# Patient Record
Sex: Female | Born: 1940
Health system: Southern US, Community
[De-identification: ages and names within clinical notes are randomized; demographics above are authoritative.]

## PROBLEM LIST (undated history)

## (undated) DIAGNOSIS — I509 Heart failure, unspecified: Secondary | ICD-10-CM

## (undated) DIAGNOSIS — Z950 Presence of cardiac pacemaker: Secondary | ICD-10-CM

## (undated) DIAGNOSIS — L0390 Cellulitis, unspecified: Secondary | ICD-10-CM

## (undated) DIAGNOSIS — I7121 Aneurysm of the ascending aorta, without rupture: Secondary | ICD-10-CM

## (undated) DIAGNOSIS — C50919 Malignant neoplasm of unspecified site of unspecified female breast: Secondary | ICD-10-CM

## (undated) DIAGNOSIS — M1990 Unspecified osteoarthritis, unspecified site: Secondary | ICD-10-CM

## (undated) DIAGNOSIS — I272 Pulmonary hypertension, unspecified: Secondary | ICD-10-CM

## (undated) DIAGNOSIS — I10 Essential (primary) hypertension: Secondary | ICD-10-CM

## (undated) DIAGNOSIS — C50911 Malignant neoplasm of unspecified site of right female breast: Secondary | ICD-10-CM

## (undated) DIAGNOSIS — I8390 Asymptomatic varicose veins of unspecified lower extremity: Secondary | ICD-10-CM

## (undated) DIAGNOSIS — E785 Hyperlipidemia, unspecified: Secondary | ICD-10-CM

## (undated) DIAGNOSIS — C50411 Malignant neoplasm of upper-outer quadrant of right female breast: Secondary | ICD-10-CM

## (undated) DIAGNOSIS — I480 Paroxysmal atrial fibrillation: Secondary | ICD-10-CM

## (undated) HISTORY — PX: JOINT REPLACEMENT: SHX530

## (undated) HISTORY — DX: Heart failure, unspecified: I50.9

## (undated) HISTORY — PX: SHOULDER SURGERY: SHX246

## (undated) HISTORY — PX: FOOT SURGERY: SHX648

## (undated) HISTORY — DX: Malignant neoplasm of unspecified site of unspecified female breast: C50.919

## (undated) HISTORY — DX: Aneurysm of the ascending aorta, without rupture: I71.21

## (undated) HISTORY — DX: Malignant neoplasm of unspecified site of right female breast: C50.911

## (undated) HISTORY — PX: CHOLECYSTECTOMY: SHX55

## (undated) HISTORY — PX: ABDOMINAL HYSTERECTOMY: SHX81

## (undated) HISTORY — DX: Cellulitis, unspecified: L03.90

## (undated) HISTORY — DX: Pulmonary hypertension, unspecified: I27.20

## (undated) HISTORY — DX: Malignant neoplasm of upper-outer quadrant of right female breast: C50.411

## (undated) HISTORY — DX: Unspecified osteoarthritis, unspecified site: M19.90

## (undated) HISTORY — DX: Hyperlipidemia, unspecified: E78.5

## (undated) HISTORY — DX: Paroxysmal atrial fibrillation: I48.0

## (undated) HISTORY — DX: Asymptomatic varicose veins of unspecified lower extremity: I83.90

## (undated) HISTORY — DX: Essential (primary) hypertension: I10

---

## 2007-10-29 ENCOUNTER — Ambulatory Visit: Payer: Self-pay | Admitting: Cardiology

## 2010-02-07 ENCOUNTER — Ambulatory Visit: Payer: Self-pay | Admitting: Cardiology

## 2011-04-11 ENCOUNTER — Other Ambulatory Visit: Payer: Self-pay

## 2011-04-11 DIAGNOSIS — M7989 Other specified soft tissue disorders: Secondary | ICD-10-CM

## 2011-05-16 DIAGNOSIS — J019 Acute sinusitis, unspecified: Secondary | ICD-10-CM | POA: Diagnosis not present

## 2011-05-16 DIAGNOSIS — I10 Essential (primary) hypertension: Secondary | ICD-10-CM | POA: Diagnosis not present

## 2011-05-16 DIAGNOSIS — F320 Major depressive disorder, single episode, mild: Secondary | ICD-10-CM | POA: Diagnosis not present

## 2011-05-16 DIAGNOSIS — J029 Acute pharyngitis, unspecified: Secondary | ICD-10-CM | POA: Diagnosis not present

## 2011-05-16 DIAGNOSIS — N3000 Acute cystitis without hematuria: Secondary | ICD-10-CM | POA: Diagnosis not present

## 2011-05-16 DIAGNOSIS — M549 Dorsalgia, unspecified: Secondary | ICD-10-CM | POA: Diagnosis not present

## 2011-05-16 DIAGNOSIS — J209 Acute bronchitis, unspecified: Secondary | ICD-10-CM | POA: Diagnosis not present

## 2011-06-13 DIAGNOSIS — J029 Acute pharyngitis, unspecified: Secondary | ICD-10-CM | POA: Diagnosis not present

## 2011-06-13 DIAGNOSIS — F320 Major depressive disorder, single episode, mild: Secondary | ICD-10-CM | POA: Diagnosis not present

## 2011-06-13 DIAGNOSIS — M549 Dorsalgia, unspecified: Secondary | ICD-10-CM | POA: Diagnosis not present

## 2011-06-13 DIAGNOSIS — J209 Acute bronchitis, unspecified: Secondary | ICD-10-CM | POA: Diagnosis not present

## 2011-06-13 DIAGNOSIS — J019 Acute sinusitis, unspecified: Secondary | ICD-10-CM | POA: Diagnosis not present

## 2011-06-13 DIAGNOSIS — I10 Essential (primary) hypertension: Secondary | ICD-10-CM | POA: Diagnosis not present

## 2011-06-13 DIAGNOSIS — N3000 Acute cystitis without hematuria: Secondary | ICD-10-CM | POA: Diagnosis not present

## 2011-06-24 ENCOUNTER — Other Ambulatory Visit: Payer: Self-pay

## 2011-06-24 ENCOUNTER — Encounter: Payer: Self-pay | Admitting: Vascular Surgery

## 2011-07-10 DIAGNOSIS — J029 Acute pharyngitis, unspecified: Secondary | ICD-10-CM | POA: Diagnosis not present

## 2011-07-10 DIAGNOSIS — J019 Acute sinusitis, unspecified: Secondary | ICD-10-CM | POA: Diagnosis not present

## 2011-07-10 DIAGNOSIS — M47817 Spondylosis without myelopathy or radiculopathy, lumbosacral region: Secondary | ICD-10-CM | POA: Diagnosis not present

## 2011-07-10 DIAGNOSIS — J209 Acute bronchitis, unspecified: Secondary | ICD-10-CM | POA: Diagnosis not present

## 2011-07-10 DIAGNOSIS — M519 Unspecified thoracic, thoracolumbar and lumbosacral intervertebral disc disorder: Secondary | ICD-10-CM | POA: Diagnosis not present

## 2011-07-10 DIAGNOSIS — N3000 Acute cystitis without hematuria: Secondary | ICD-10-CM | POA: Diagnosis not present

## 2011-07-10 DIAGNOSIS — I10 Essential (primary) hypertension: Secondary | ICD-10-CM | POA: Diagnosis not present

## 2011-07-10 DIAGNOSIS — M5137 Other intervertebral disc degeneration, lumbosacral region: Secondary | ICD-10-CM | POA: Diagnosis not present

## 2011-07-10 DIAGNOSIS — F320 Major depressive disorder, single episode, mild: Secondary | ICD-10-CM | POA: Diagnosis not present

## 2011-07-10 DIAGNOSIS — M549 Dorsalgia, unspecified: Secondary | ICD-10-CM | POA: Diagnosis not present

## 2011-07-15 DIAGNOSIS — I10 Essential (primary) hypertension: Secondary | ICD-10-CM | POA: Diagnosis not present

## 2011-07-15 DIAGNOSIS — R109 Unspecified abdominal pain: Secondary | ICD-10-CM | POA: Diagnosis not present

## 2011-07-15 DIAGNOSIS — F329 Major depressive disorder, single episode, unspecified: Secondary | ICD-10-CM | POA: Diagnosis not present

## 2011-07-15 DIAGNOSIS — Z780 Asymptomatic menopausal state: Secondary | ICD-10-CM | POA: Diagnosis not present

## 2011-07-15 DIAGNOSIS — R599 Enlarged lymph nodes, unspecified: Secondary | ICD-10-CM | POA: Diagnosis not present

## 2011-07-15 DIAGNOSIS — Z7982 Long term (current) use of aspirin: Secondary | ICD-10-CM | POA: Diagnosis not present

## 2011-07-15 DIAGNOSIS — Z79899 Other long term (current) drug therapy: Secondary | ICD-10-CM | POA: Diagnosis not present

## 2011-07-15 DIAGNOSIS — K449 Diaphragmatic hernia without obstruction or gangrene: Secondary | ICD-10-CM | POA: Diagnosis not present

## 2011-07-16 ENCOUNTER — Encounter: Payer: Self-pay | Admitting: Vascular Surgery

## 2011-07-16 DIAGNOSIS — R109 Unspecified abdominal pain: Secondary | ICD-10-CM | POA: Diagnosis not present

## 2011-07-16 DIAGNOSIS — F329 Major depressive disorder, single episode, unspecified: Secondary | ICD-10-CM | POA: Diagnosis not present

## 2011-07-16 DIAGNOSIS — K449 Diaphragmatic hernia without obstruction or gangrene: Secondary | ICD-10-CM | POA: Diagnosis not present

## 2011-07-16 DIAGNOSIS — I10 Essential (primary) hypertension: Secondary | ICD-10-CM | POA: Diagnosis not present

## 2011-07-21 ENCOUNTER — Encounter: Payer: Self-pay | Admitting: Vascular Surgery

## 2011-07-22 ENCOUNTER — Encounter: Payer: Self-pay | Admitting: Vascular Surgery

## 2011-07-25 ENCOUNTER — Encounter: Payer: Self-pay | Admitting: Vascular Surgery

## 2011-07-28 ENCOUNTER — Encounter: Payer: Self-pay | Admitting: Vascular Surgery

## 2011-07-28 ENCOUNTER — Ambulatory Visit (INDEPENDENT_AMBULATORY_CARE_PROVIDER_SITE_OTHER): Payer: Medicare Other | Admitting: Vascular Surgery

## 2011-07-28 VITALS — BP 150/85 | HR 76 | Resp 20 | Ht 64.0 in | Wt 178.0 lb

## 2011-07-28 DIAGNOSIS — M7989 Other specified soft tissue disorders: Secondary | ICD-10-CM

## 2011-07-28 DIAGNOSIS — I83893 Varicose veins of bilateral lower extremities with other complications: Secondary | ICD-10-CM

## 2011-07-28 NOTE — Progress Notes (Signed)
LLE venous reflux duplex performed @ [facility] 07/28/2011

## 2011-07-28 NOTE — Progress Notes (Signed)
Subjective:     Patient ID: Alicia Nash, female   DOB: [birth year], 71 y.o.   MRN: [id number]  HPI this 71 year old female was referred for swelling in the left lower extremity to rule out venous insufficiency. She has no history of DVT. She does have a remote history of thrombophlebitis in the right leg. She has a remote history of vein stripping in the right leg 10-15 years ago. She denies any history of stasis ulcers, bulging varicosities in the left leg, bleeding. She has tried wearing elastic compression stockings with the first stockings caused a rash on her legs. She had these replaced with a lighter weight bear which she is currently wearing. She does elevate her legs at night on a pillow. She has aching and throbbing discomfort in the leg as the day progresses. She has a history of bilateral knee replacements by Dr. Joni Fears  Past Medical History  Diagnosis Date  . Cellulitis     Right leg  . Hyperlipidemia   . Varicose veins     venous stasis skin changes    History  Substance Use Topics  . Smoking status: Never Smoker   . Smokeless tobacco: Never Used  . Alcohol Use: No    Family History  Problem Relation Age of Onset  . Hypertension Other   . Diabetes Mother     Allergies  Allergen Reactions  . Other     Blisters after wear compression stockings    Current outpatient prescriptions:AMLODIPINE BESYLATE PO, Take by mouth., Disp: , Rfl: ;  aspirin 81 MG tablet, Take 81 mg by mouth daily., Disp: , Rfl: ;  BENAZEPRIL-HYDROCHLOROTHIAZIDE PO, Take by mouth., Disp: , Rfl: ;  CITALOPRAM HYDROBROMIDE PO, Take by mouth., Disp: , Rfl: ;  DIAZEPAM PO, Take by mouth., Disp: , Rfl: ;  DICLOFENAC POTASSIUM PO, Take by mouth., Disp: , Rfl: ;  Docusate Calcium (STOOL SOFTENER PO), Take by mouth., Disp: , Rfl:  Fenofibrate (TRICOR PO), Take by mouth., Disp: , Rfl: ;  Multiple Vitamin (MULTIVITAMIN) tablet, Take 1 tablet by mouth daily., Disp: , Rfl: ;  omeprazole (PRILOSEC) 40 MG  capsule, Take 40 mg by mouth daily., Disp: , Rfl:   BP 150/85  Pulse 76  Resp 20  Ht _0  (1.626 m)  Wt 178 lb (80.74 kg)  BMI 30.55 kg/m2  Body mass index is 30.55 kg/(m^2).         Review of Systems has multiple joint problems including bilateral knee replacements and orthopedic procedures on her feet. Denies chest pain, dyspnea on exertion, PND, orthopnea, neurologic symptoms suggesting TIAs, other systems are negative and complete review of systems    Objective:   Physical Exam blood pressure 150/85 heart rate 76 respirations 20 Gen.-alert and oriented x3 in no apparent distress HEENT normal for age Lungs no rhonchi or wheezing Cardiovascular regular rhythm no murmurs carotid pulses 3+ palpable no bruits audible Abdomen soft nontender no palpable masses Musculoskeletal free of  major deformities Skin clear -no rashes Neurologic normal Lower extremities 3+ femoral and dorsalis pedis pulses palpable bilaterally with 1+ edema in the left leg. She has some mild erythema in the left pretibial region. There are no active ulcerations or varicosities noted. Right leg has some hyperpigmentation and thickening of the skin in the lower third. There are reticular and spider veins in the ankle and foot on the right. There is less edema on the right than the left. No active ulcerations are noted.  Today I  ordered bilateral venous duplex exam which I reviewed and the left great saphenous vein has no reflux in the left small saphenous vein has no reflux. The left deep system does have some reflux at the popliteal level which is mild. There is no DVT.      Assessment:     Edema left leg with deep venous reflux in the popliteal vein No active ulcerations    Plan:     #1 elevate foot of the bed 2 inches at night to help decrease swelling #2 continue wearing daily bilateral elastic compression stocking #3 can discuss possible diuretic therapy with her medical physician   return to see  [place] on when necessary basis

## 2011-08-05 NOTE — Procedures (Unsigned)
LOWER EXTREMITY VENOUS REFLUX EXAM  INDICATION:  Left lower extremity varicose veins, edema  EXAM:  Using color-flow imaging and pulse Doppler spectral analysis, the left common femoral, femoral, popliteal, posterior tibial, great and small saphenous veins were evaluated.  There is evidence suggesting deep venous insufficiency in the left lower extremity.  The left saphenofemoral junction is competent.  The left GSV is competent.  The left proximal small saphenous vein demonstrates competency.  GSV Diameter (used if found to be incompetent only)                                           Right    Left Proximal Greater Saphenous Vein           cm       cm Proximal-to-mid-thigh                     cm       cm Mid thigh                                 cm       cm Mid-distal thigh                          cm       cm Distal thigh                              cm       cm Knee                                      cm       cm  IMPRESSION: 1. The left great saphenous vein is competent. 2. The left great saphenous vein is not tortuous. 3. The deep venous system of the left lower extremity is not competent     with reflux of >500 milliseconds. 4. The left small saphenous vein is competent.  ___________________________________________ Nelda Severe. Kellie Simmering, M.D.  SH/MEDQ  D:  07/28/2011  T:  07/28/2011  Job:  [id number]

## 2011-08-21 DIAGNOSIS — E782 Mixed hyperlipidemia: Secondary | ICD-10-CM | POA: Diagnosis not present

## 2011-08-21 DIAGNOSIS — L02419 Cutaneous abscess of limb, unspecified: Secondary | ICD-10-CM | POA: Diagnosis not present

## 2011-11-27 DIAGNOSIS — J209 Acute bronchitis, unspecified: Secondary | ICD-10-CM | POA: Diagnosis not present

## 2011-12-01 DIAGNOSIS — M79609 Pain in unspecified limb: Secondary | ICD-10-CM | POA: Diagnosis not present

## 2011-12-01 DIAGNOSIS — M19079 Primary osteoarthritis, unspecified ankle and foot: Secondary | ICD-10-CM | POA: Diagnosis not present

## 2011-12-04 DIAGNOSIS — G8929 Other chronic pain: Secondary | ICD-10-CM | POA: Diagnosis present

## 2011-12-04 DIAGNOSIS — L02619 Cutaneous abscess of unspecified foot: Secondary | ICD-10-CM | POA: Diagnosis present

## 2011-12-04 DIAGNOSIS — R609 Edema, unspecified: Secondary | ICD-10-CM | POA: Diagnosis not present

## 2011-12-04 DIAGNOSIS — I8390 Asymptomatic varicose veins of unspecified lower extremity: Secondary | ICD-10-CM | POA: Diagnosis present

## 2011-12-04 DIAGNOSIS — E7800 Pure hypercholesterolemia, unspecified: Secondary | ICD-10-CM | POA: Diagnosis not present

## 2011-12-04 DIAGNOSIS — Z79899 Other long term (current) drug therapy: Secondary | ICD-10-CM | POA: Diagnosis not present

## 2011-12-04 DIAGNOSIS — J019 Acute sinusitis, unspecified: Secondary | ICD-10-CM | POA: Diagnosis not present

## 2011-12-04 DIAGNOSIS — E785 Hyperlipidemia, unspecified: Secondary | ICD-10-CM | POA: Diagnosis present

## 2011-12-04 DIAGNOSIS — M109 Gout, unspecified: Secondary | ICD-10-CM | POA: Diagnosis present

## 2011-12-04 DIAGNOSIS — K219 Gastro-esophageal reflux disease without esophagitis: Secondary | ICD-10-CM | POA: Diagnosis not present

## 2011-12-04 DIAGNOSIS — F329 Major depressive disorder, single episode, unspecified: Secondary | ICD-10-CM | POA: Diagnosis not present

## 2011-12-04 DIAGNOSIS — R509 Fever, unspecified: Secondary | ICD-10-CM | POA: Diagnosis not present

## 2011-12-04 DIAGNOSIS — J329 Chronic sinusitis, unspecified: Secondary | ICD-10-CM | POA: Diagnosis present

## 2011-12-04 DIAGNOSIS — E669 Obesity, unspecified: Secondary | ICD-10-CM | POA: Diagnosis not present

## 2011-12-04 DIAGNOSIS — Z96659 Presence of unspecified artificial knee joint: Secondary | ICD-10-CM | POA: Diagnosis not present

## 2011-12-04 DIAGNOSIS — L03119 Cellulitis of unspecified part of limb: Secondary | ICD-10-CM | POA: Diagnosis not present

## 2011-12-04 DIAGNOSIS — Z7982 Long term (current) use of aspirin: Secondary | ICD-10-CM | POA: Diagnosis not present

## 2011-12-04 DIAGNOSIS — Z6831 Body mass index (BMI) 31.0-31.9, adult: Secondary | ICD-10-CM | POA: Diagnosis not present

## 2011-12-04 DIAGNOSIS — I10 Essential (primary) hypertension: Secondary | ICD-10-CM | POA: Diagnosis not present

## 2011-12-04 DIAGNOSIS — Z96619 Presence of unspecified artificial shoulder joint: Secondary | ICD-10-CM | POA: Diagnosis not present

## 2011-12-04 DIAGNOSIS — M79609 Pain in unspecified limb: Secondary | ICD-10-CM | POA: Diagnosis not present

## 2011-12-22 DIAGNOSIS — M19079 Primary osteoarthritis, unspecified ankle and foot: Secondary | ICD-10-CM | POA: Diagnosis not present

## 2011-12-22 DIAGNOSIS — M79609 Pain in unspecified limb: Secondary | ICD-10-CM | POA: Diagnosis not present

## 2011-12-29 DIAGNOSIS — L600 Ingrowing nail: Secondary | ICD-10-CM | POA: Diagnosis not present

## 2012-01-12 DIAGNOSIS — R21 Rash and other nonspecific skin eruption: Secondary | ICD-10-CM | POA: Diagnosis not present

## 2012-02-17 DIAGNOSIS — H524 Presbyopia: Secondary | ICD-10-CM | POA: Diagnosis not present

## 2012-02-17 DIAGNOSIS — H5200 Hypermetropia, unspecified eye: Secondary | ICD-10-CM | POA: Diagnosis not present

## 2012-02-17 DIAGNOSIS — H2510 Age-related nuclear cataract, unspecified eye: Secondary | ICD-10-CM | POA: Diagnosis not present

## 2012-02-17 DIAGNOSIS — H52229 Regular astigmatism, unspecified eye: Secondary | ICD-10-CM | POA: Diagnosis not present

## 2012-02-27 DIAGNOSIS — I10 Essential (primary) hypertension: Secondary | ICD-10-CM | POA: Diagnosis not present

## 2012-05-03 DIAGNOSIS — M549 Dorsalgia, unspecified: Secondary | ICD-10-CM | POA: Diagnosis not present

## 2012-05-03 DIAGNOSIS — IMO0002 Reserved for concepts with insufficient information to code with codable children: Secondary | ICD-10-CM | POA: Diagnosis not present

## 2012-05-03 DIAGNOSIS — M47814 Spondylosis without myelopathy or radiculopathy, thoracic region: Secondary | ICD-10-CM | POA: Diagnosis not present

## 2012-05-03 DIAGNOSIS — E782 Mixed hyperlipidemia: Secondary | ICD-10-CM | POA: Diagnosis not present

## 2012-05-03 DIAGNOSIS — M47817 Spondylosis without myelopathy or radiculopathy, lumbosacral region: Secondary | ICD-10-CM | POA: Diagnosis not present

## 2012-05-03 DIAGNOSIS — M4310 Spondylolisthesis, site unspecified: Secondary | ICD-10-CM | POA: Diagnosis not present

## 2012-05-03 DIAGNOSIS — M5137 Other intervertebral disc degeneration, lumbosacral region: Secondary | ICD-10-CM | POA: Diagnosis not present

## 2012-05-19 DIAGNOSIS — K602 Anal fissure, unspecified: Secondary | ICD-10-CM | POA: Diagnosis not present

## 2012-06-23 DIAGNOSIS — L600 Ingrowing nail: Secondary | ICD-10-CM | POA: Diagnosis not present

## 2012-06-23 DIAGNOSIS — M79609 Pain in unspecified limb: Secondary | ICD-10-CM | POA: Diagnosis not present

## 2012-07-07 DIAGNOSIS — M79609 Pain in unspecified limb: Secondary | ICD-10-CM | POA: Diagnosis not present

## 2012-07-07 DIAGNOSIS — L600 Ingrowing nail: Secondary | ICD-10-CM | POA: Diagnosis not present

## 2012-07-30 DIAGNOSIS — J209 Acute bronchitis, unspecified: Secondary | ICD-10-CM | POA: Diagnosis not present

## 2012-08-07 DIAGNOSIS — G609 Hereditary and idiopathic neuropathy, unspecified: Secondary | ICD-10-CM | POA: Diagnosis not present

## 2012-08-07 DIAGNOSIS — M79609 Pain in unspecified limb: Secondary | ICD-10-CM | POA: Diagnosis not present

## 2012-08-07 DIAGNOSIS — Z862 Personal history of diseases of the blood and blood-forming organs and certain disorders involving the immune mechanism: Secondary | ICD-10-CM | POA: Diagnosis not present

## 2012-08-07 DIAGNOSIS — Z79899 Other long term (current) drug therapy: Secondary | ICD-10-CM | POA: Diagnosis not present

## 2012-08-07 DIAGNOSIS — Z7982 Long term (current) use of aspirin: Secondary | ICD-10-CM | POA: Diagnosis not present

## 2012-08-07 DIAGNOSIS — F411 Generalized anxiety disorder: Secondary | ICD-10-CM | POA: Diagnosis not present

## 2012-08-07 DIAGNOSIS — I10 Essential (primary) hypertension: Secondary | ICD-10-CM | POA: Diagnosis not present

## 2012-08-07 DIAGNOSIS — M19079 Primary osteoarthritis, unspecified ankle and foot: Secondary | ICD-10-CM | POA: Diagnosis not present

## 2012-08-13 DIAGNOSIS — Z780 Asymptomatic menopausal state: Secondary | ICD-10-CM | POA: Diagnosis not present

## 2012-08-13 DIAGNOSIS — F329 Major depressive disorder, single episode, unspecified: Secondary | ICD-10-CM | POA: Diagnosis not present

## 2012-08-13 DIAGNOSIS — E785 Hyperlipidemia, unspecified: Secondary | ICD-10-CM | POA: Diagnosis not present

## 2012-08-13 DIAGNOSIS — M79609 Pain in unspecified limb: Secondary | ICD-10-CM | POA: Diagnosis not present

## 2012-08-13 DIAGNOSIS — Z79899 Other long term (current) drug therapy: Secondary | ICD-10-CM | POA: Diagnosis not present

## 2012-08-13 DIAGNOSIS — I129 Hypertensive chronic kidney disease with stage 1 through stage 4 chronic kidney disease, or unspecified chronic kidney disease: Secondary | ICD-10-CM | POA: Diagnosis not present

## 2012-08-13 DIAGNOSIS — M109 Gout, unspecified: Secondary | ICD-10-CM | POA: Diagnosis not present

## 2012-08-13 DIAGNOSIS — R791 Abnormal coagulation profile: Secondary | ICD-10-CM | POA: Diagnosis not present

## 2012-08-13 DIAGNOSIS — Z7982 Long term (current) use of aspirin: Secondary | ICD-10-CM | POA: Diagnosis not present

## 2012-08-13 DIAGNOSIS — N289 Disorder of kidney and ureter, unspecified: Secondary | ICD-10-CM | POA: Diagnosis not present

## 2012-08-13 DIAGNOSIS — N183 Chronic kidney disease, stage 3 unspecified: Secondary | ICD-10-CM | POA: Diagnosis not present

## 2012-08-13 DIAGNOSIS — Z96659 Presence of unspecified artificial knee joint: Secondary | ICD-10-CM | POA: Diagnosis not present

## 2012-08-13 DIAGNOSIS — D649 Anemia, unspecified: Secondary | ICD-10-CM | POA: Diagnosis not present

## 2012-08-13 DIAGNOSIS — K449 Diaphragmatic hernia without obstruction or gangrene: Secondary | ICD-10-CM | POA: Diagnosis not present

## 2012-08-14 DIAGNOSIS — M109 Gout, unspecified: Secondary | ICD-10-CM | POA: Diagnosis not present

## 2012-08-14 DIAGNOSIS — M79609 Pain in unspecified limb: Secondary | ICD-10-CM | POA: Diagnosis not present

## 2012-08-14 DIAGNOSIS — N183 Chronic kidney disease, stage 3 unspecified: Secondary | ICD-10-CM | POA: Diagnosis not present

## 2012-08-14 DIAGNOSIS — N289 Disorder of kidney and ureter, unspecified: Secondary | ICD-10-CM | POA: Diagnosis not present

## 2012-08-14 DIAGNOSIS — I129 Hypertensive chronic kidney disease with stage 1 through stage 4 chronic kidney disease, or unspecified chronic kidney disease: Secondary | ICD-10-CM | POA: Diagnosis not present

## 2012-08-14 DIAGNOSIS — D649 Anemia, unspecified: Secondary | ICD-10-CM | POA: Diagnosis not present

## 2012-08-14 DIAGNOSIS — F329 Major depressive disorder, single episode, unspecified: Secondary | ICD-10-CM | POA: Diagnosis not present

## 2012-08-15 DIAGNOSIS — I129 Hypertensive chronic kidney disease with stage 1 through stage 4 chronic kidney disease, or unspecified chronic kidney disease: Secondary | ICD-10-CM | POA: Diagnosis not present

## 2012-08-15 DIAGNOSIS — F329 Major depressive disorder, single episode, unspecified: Secondary | ICD-10-CM | POA: Diagnosis not present

## 2012-08-15 DIAGNOSIS — N183 Chronic kidney disease, stage 3 unspecified: Secondary | ICD-10-CM | POA: Diagnosis not present

## 2012-08-15 DIAGNOSIS — N289 Disorder of kidney and ureter, unspecified: Secondary | ICD-10-CM | POA: Diagnosis not present

## 2012-08-15 DIAGNOSIS — D649 Anemia, unspecified: Secondary | ICD-10-CM | POA: Diagnosis not present

## 2012-08-15 DIAGNOSIS — M109 Gout, unspecified: Secondary | ICD-10-CM | POA: Diagnosis not present

## 2012-08-15 DIAGNOSIS — M79609 Pain in unspecified limb: Secondary | ICD-10-CM | POA: Diagnosis not present

## 2012-08-26 DIAGNOSIS — M109 Gout, unspecified: Secondary | ICD-10-CM | POA: Diagnosis not present

## 2012-09-13 DIAGNOSIS — R21 Rash and other nonspecific skin eruption: Secondary | ICD-10-CM | POA: Diagnosis not present

## 2012-09-22 DIAGNOSIS — M109 Gout, unspecified: Secondary | ICD-10-CM | POA: Diagnosis not present

## 2012-09-22 DIAGNOSIS — M79609 Pain in unspecified limb: Secondary | ICD-10-CM | POA: Diagnosis not present

## 2012-10-02 DIAGNOSIS — N200 Calculus of kidney: Secondary | ICD-10-CM | POA: Diagnosis not present

## 2012-10-02 DIAGNOSIS — I10 Essential (primary) hypertension: Secondary | ICD-10-CM | POA: Diagnosis not present

## 2012-10-02 DIAGNOSIS — Z780 Asymptomatic menopausal state: Secondary | ICD-10-CM | POA: Diagnosis not present

## 2012-10-02 DIAGNOSIS — M109 Gout, unspecified: Secondary | ICD-10-CM | POA: Diagnosis present

## 2012-10-02 DIAGNOSIS — F329 Major depressive disorder, single episode, unspecified: Secondary | ICD-10-CM | POA: Diagnosis present

## 2012-10-02 DIAGNOSIS — M1990 Unspecified osteoarthritis, unspecified site: Secondary | ICD-10-CM | POA: Diagnosis present

## 2012-10-02 DIAGNOSIS — E860 Dehydration: Secondary | ICD-10-CM | POA: Diagnosis not present

## 2012-10-02 DIAGNOSIS — N179 Acute kidney failure, unspecified: Secondary | ICD-10-CM | POA: Diagnosis not present

## 2012-10-02 DIAGNOSIS — N170 Acute kidney failure with tubular necrosis: Secondary | ICD-10-CM | POA: Diagnosis not present

## 2012-10-02 DIAGNOSIS — Z79899 Other long term (current) drug therapy: Secondary | ICD-10-CM | POA: Diagnosis not present

## 2012-10-02 DIAGNOSIS — N390 Urinary tract infection, site not specified: Secondary | ICD-10-CM | POA: Diagnosis not present

## 2012-10-02 DIAGNOSIS — K449 Diaphragmatic hernia without obstruction or gangrene: Secondary | ICD-10-CM | POA: Diagnosis not present

## 2012-10-02 DIAGNOSIS — E785 Hyperlipidemia, unspecified: Secondary | ICD-10-CM | POA: Diagnosis present

## 2012-10-03 DIAGNOSIS — N179 Acute kidney failure, unspecified: Secondary | ICD-10-CM | POA: Diagnosis not present

## 2012-10-03 DIAGNOSIS — N200 Calculus of kidney: Secondary | ICD-10-CM | POA: Diagnosis not present

## 2012-10-14 DIAGNOSIS — N19 Unspecified kidney failure: Secondary | ICD-10-CM | POA: Diagnosis not present

## 2012-10-15 DIAGNOSIS — I10 Essential (primary) hypertension: Secondary | ICD-10-CM | POA: Diagnosis not present

## 2012-11-08 DIAGNOSIS — L509 Urticaria, unspecified: Secondary | ICD-10-CM | POA: Diagnosis not present

## 2013-01-10 DIAGNOSIS — M109 Gout, unspecified: Secondary | ICD-10-CM | POA: Diagnosis not present

## 2013-01-10 DIAGNOSIS — R5381 Other malaise: Secondary | ICD-10-CM | POA: Diagnosis not present

## 2013-01-10 DIAGNOSIS — R609 Edema, unspecified: Secondary | ICD-10-CM | POA: Diagnosis not present

## 2013-01-14 DIAGNOSIS — M549 Dorsalgia, unspecified: Secondary | ICD-10-CM | POA: Diagnosis not present

## 2013-01-14 DIAGNOSIS — M47814 Spondylosis without myelopathy or radiculopathy, thoracic region: Secondary | ICD-10-CM | POA: Diagnosis not present

## 2013-01-14 DIAGNOSIS — IMO0002 Reserved for concepts with insufficient information to code with codable children: Secondary | ICD-10-CM | POA: Diagnosis not present

## 2013-03-14 DIAGNOSIS — J029 Acute pharyngitis, unspecified: Secondary | ICD-10-CM | POA: Diagnosis not present

## 2013-04-12 DIAGNOSIS — L242 Irritant contact dermatitis due to solvents: Secondary | ICD-10-CM | POA: Diagnosis not present

## 2013-04-12 DIAGNOSIS — Z0000 Encounter for general adult medical examination without abnormal findings: Secondary | ICD-10-CM | POA: Diagnosis not present

## 2013-05-11 ENCOUNTER — Inpatient Hospital Stay: Admission: AD | Admit: 2013-05-11 | Payer: Self-pay | Source: Other Acute Inpatient Hospital | Admitting: Cardiology

## 2013-05-11 DIAGNOSIS — M129 Arthropathy, unspecified: Secondary | ICD-10-CM | POA: Diagnosis present

## 2013-05-11 DIAGNOSIS — R079 Chest pain, unspecified: Secondary | ICD-10-CM | POA: Diagnosis not present

## 2013-05-11 DIAGNOSIS — F329 Major depressive disorder, single episode, unspecified: Secondary | ICD-10-CM | POA: Diagnosis present

## 2013-05-11 DIAGNOSIS — E8779 Other fluid overload: Secondary | ICD-10-CM | POA: Diagnosis present

## 2013-05-11 DIAGNOSIS — F411 Generalized anxiety disorder: Secondary | ICD-10-CM | POA: Diagnosis present

## 2013-05-11 DIAGNOSIS — K219 Gastro-esophageal reflux disease without esophagitis: Secondary | ICD-10-CM | POA: Diagnosis present

## 2013-05-11 DIAGNOSIS — I080 Rheumatic disorders of both mitral and aortic valves: Secondary | ICD-10-CM | POA: Diagnosis present

## 2013-05-11 DIAGNOSIS — D72829 Elevated white blood cell count, unspecified: Secondary | ICD-10-CM | POA: Diagnosis not present

## 2013-05-11 DIAGNOSIS — Z79899 Other long term (current) drug therapy: Secondary | ICD-10-CM | POA: Diagnosis not present

## 2013-05-11 DIAGNOSIS — Z8249 Family history of ischemic heart disease and other diseases of the circulatory system: Secondary | ICD-10-CM | POA: Diagnosis not present

## 2013-05-11 DIAGNOSIS — E785 Hyperlipidemia, unspecified: Secondary | ICD-10-CM | POA: Diagnosis not present

## 2013-05-11 DIAGNOSIS — M549 Dorsalgia, unspecified: Secondary | ICD-10-CM | POA: Diagnosis present

## 2013-05-11 DIAGNOSIS — F3289 Other specified depressive episodes: Secondary | ICD-10-CM | POA: Diagnosis present

## 2013-05-11 DIAGNOSIS — R0602 Shortness of breath: Secondary | ICD-10-CM | POA: Diagnosis not present

## 2013-05-11 DIAGNOSIS — I442 Atrioventricular block, complete: Secondary | ICD-10-CM | POA: Insufficient documentation

## 2013-05-11 DIAGNOSIS — I455 Other specified heart block: Secondary | ICD-10-CM | POA: Diagnosis not present

## 2013-05-11 DIAGNOSIS — I10 Essential (primary) hypertension: Secondary | ICD-10-CM | POA: Diagnosis not present

## 2013-05-11 DIAGNOSIS — I059 Rheumatic mitral valve disease, unspecified: Secondary | ICD-10-CM | POA: Diagnosis not present

## 2013-05-11 DIAGNOSIS — Z950 Presence of cardiac pacemaker: Secondary | ICD-10-CM | POA: Diagnosis not present

## 2013-05-12 HISTORY — PX: PACEMAKER IMPLANT: EP1218

## 2013-05-16 DIAGNOSIS — I951 Orthostatic hypotension: Secondary | ICD-10-CM | POA: Diagnosis not present

## 2013-05-18 DIAGNOSIS — I10 Essential (primary) hypertension: Secondary | ICD-10-CM | POA: Diagnosis not present

## 2013-05-18 DIAGNOSIS — Z131 Encounter for screening for diabetes mellitus: Secondary | ICD-10-CM | POA: Diagnosis not present

## 2013-06-06 DIAGNOSIS — K219 Gastro-esophageal reflux disease without esophagitis: Secondary | ICD-10-CM | POA: Diagnosis not present

## 2013-06-29 DIAGNOSIS — Z45018 Encounter for adjustment and management of other part of cardiac pacemaker: Secondary | ICD-10-CM | POA: Diagnosis not present

## 2013-06-29 DIAGNOSIS — I442 Atrioventricular block, complete: Secondary | ICD-10-CM | POA: Diagnosis not present

## 2013-07-11 DIAGNOSIS — Z780 Asymptomatic menopausal state: Secondary | ICD-10-CM | POA: Diagnosis not present

## 2013-07-11 DIAGNOSIS — M810 Age-related osteoporosis without current pathological fracture: Secondary | ICD-10-CM | POA: Diagnosis not present

## 2013-07-11 DIAGNOSIS — M899 Disorder of bone, unspecified: Secondary | ICD-10-CM | POA: Diagnosis not present

## 2013-07-11 DIAGNOSIS — M949 Disorder of cartilage, unspecified: Secondary | ICD-10-CM | POA: Diagnosis not present

## 2013-08-12 DIAGNOSIS — J040 Acute laryngitis: Secondary | ICD-10-CM | POA: Diagnosis not present

## 2013-09-20 DIAGNOSIS — I10 Essential (primary) hypertension: Secondary | ICD-10-CM | POA: Diagnosis not present

## 2013-09-20 DIAGNOSIS — I442 Atrioventricular block, complete: Secondary | ICD-10-CM | POA: Diagnosis not present

## 2013-10-24 DIAGNOSIS — L259 Unspecified contact dermatitis, unspecified cause: Secondary | ICD-10-CM | POA: Diagnosis not present

## 2013-10-31 DIAGNOSIS — Z950 Presence of cardiac pacemaker: Secondary | ICD-10-CM | POA: Diagnosis not present

## 2013-10-31 DIAGNOSIS — I442 Atrioventricular block, complete: Secondary | ICD-10-CM | POA: Diagnosis not present

## 2013-11-14 DIAGNOSIS — M76899 Other specified enthesopathies of unspecified lower limb, excluding foot: Secondary | ICD-10-CM | POA: Diagnosis not present

## 2013-11-14 DIAGNOSIS — M549 Dorsalgia, unspecified: Secondary | ICD-10-CM | POA: Diagnosis not present

## 2013-11-16 DIAGNOSIS — M25569 Pain in unspecified knee: Secondary | ICD-10-CM | POA: Diagnosis not present

## 2013-11-17 DIAGNOSIS — L255 Unspecified contact dermatitis due to plants, except food: Secondary | ICD-10-CM | POA: Diagnosis not present

## 2013-12-05 DIAGNOSIS — J029 Acute pharyngitis, unspecified: Secondary | ICD-10-CM | POA: Diagnosis not present

## 2013-12-29 DIAGNOSIS — H43819 Vitreous degeneration, unspecified eye: Secondary | ICD-10-CM | POA: Diagnosis not present

## 2014-01-03 DIAGNOSIS — L255 Unspecified contact dermatitis due to plants, except food: Secondary | ICD-10-CM | POA: Diagnosis not present

## 2014-01-04 DIAGNOSIS — I10 Essential (primary) hypertension: Secondary | ICD-10-CM | POA: Diagnosis not present

## 2014-01-23 DIAGNOSIS — B86 Scabies: Secondary | ICD-10-CM | POA: Diagnosis not present

## 2014-01-23 DIAGNOSIS — J209 Acute bronchitis, unspecified: Secondary | ICD-10-CM | POA: Diagnosis not present

## 2014-02-08 DIAGNOSIS — R0600 Dyspnea, unspecified: Secondary | ICD-10-CM | POA: Diagnosis not present

## 2014-02-08 DIAGNOSIS — R0602 Shortness of breath: Secondary | ICD-10-CM | POA: Diagnosis not present

## 2014-02-08 DIAGNOSIS — K219 Gastro-esophageal reflux disease without esophagitis: Secondary | ICD-10-CM | POA: Diagnosis not present

## 2014-02-08 DIAGNOSIS — E876 Hypokalemia: Secondary | ICD-10-CM | POA: Diagnosis not present

## 2014-02-08 DIAGNOSIS — Z79899 Other long term (current) drug therapy: Secondary | ICD-10-CM | POA: Diagnosis not present

## 2014-02-08 DIAGNOSIS — Z8249 Family history of ischemic heart disease and other diseases of the circulatory system: Secondary | ICD-10-CM | POA: Diagnosis not present

## 2014-02-08 DIAGNOSIS — I10 Essential (primary) hypertension: Secondary | ICD-10-CM | POA: Diagnosis not present

## 2014-02-08 DIAGNOSIS — E785 Hyperlipidemia, unspecified: Secondary | ICD-10-CM | POA: Diagnosis not present

## 2014-02-08 DIAGNOSIS — Z950 Presence of cardiac pacemaker: Secondary | ICD-10-CM | POA: Diagnosis not present

## 2014-02-14 DIAGNOSIS — J45901 Unspecified asthma with (acute) exacerbation: Secondary | ICD-10-CM | POA: Diagnosis not present

## 2014-05-12 DIAGNOSIS — Z6831 Body mass index (BMI) 31.0-31.9, adult: Secondary | ICD-10-CM | POA: Diagnosis not present

## 2014-05-12 DIAGNOSIS — J45901 Unspecified asthma with (acute) exacerbation: Secondary | ICD-10-CM | POA: Diagnosis not present

## 2014-05-12 DIAGNOSIS — I10 Essential (primary) hypertension: Secondary | ICD-10-CM | POA: Diagnosis not present

## 2014-05-17 DIAGNOSIS — Z131 Encounter for screening for diabetes mellitus: Secondary | ICD-10-CM | POA: Diagnosis not present

## 2014-05-17 DIAGNOSIS — I10 Essential (primary) hypertension: Secondary | ICD-10-CM | POA: Diagnosis not present

## 2014-07-17 DIAGNOSIS — Z6831 Body mass index (BMI) 31.0-31.9, adult: Secondary | ICD-10-CM | POA: Diagnosis not present

## 2014-07-17 DIAGNOSIS — J209 Acute bronchitis, unspecified: Secondary | ICD-10-CM | POA: Diagnosis not present

## 2014-08-02 DIAGNOSIS — R5383 Other fatigue: Secondary | ICD-10-CM | POA: Diagnosis not present

## 2014-09-27 DIAGNOSIS — Z780 Asymptomatic menopausal state: Secondary | ICD-10-CM | POA: Insufficient documentation

## 2014-09-27 DIAGNOSIS — C50411 Malignant neoplasm of upper-outer quadrant of right female breast: Secondary | ICD-10-CM

## 2014-09-27 DIAGNOSIS — E2839 Other primary ovarian failure: Secondary | ICD-10-CM | POA: Insufficient documentation

## 2014-09-27 DIAGNOSIS — C50911 Malignant neoplasm of unspecified site of right female breast: Secondary | ICD-10-CM

## 2014-09-27 HISTORY — DX: Malignant neoplasm of upper-outer quadrant of right female breast: C50.411

## 2014-09-27 HISTORY — DX: Malignant neoplasm of unspecified site of right female breast: C50.911

## 2014-10-17 DIAGNOSIS — Z6831 Body mass index (BMI) 31.0-31.9, adult: Secondary | ICD-10-CM | POA: Diagnosis not present

## 2014-10-17 DIAGNOSIS — J209 Acute bronchitis, unspecified: Secondary | ICD-10-CM | POA: Diagnosis not present

## 2014-12-28 DIAGNOSIS — N183 Chronic kidney disease, stage 3 unspecified: Secondary | ICD-10-CM | POA: Insufficient documentation

## 2014-12-28 DIAGNOSIS — E559 Vitamin D deficiency, unspecified: Secondary | ICD-10-CM | POA: Insufficient documentation

## 2014-12-28 DIAGNOSIS — Z79811 Long term (current) use of aromatase inhibitors: Secondary | ICD-10-CM | POA: Insufficient documentation

## 2015-05-18 DIAGNOSIS — J45998 Other asthma: Secondary | ICD-10-CM | POA: Diagnosis not present

## 2015-05-28 DIAGNOSIS — I10 Essential (primary) hypertension: Secondary | ICD-10-CM | POA: Diagnosis not present

## 2015-06-15 DIAGNOSIS — M79642 Pain in left hand: Secondary | ICD-10-CM | POA: Diagnosis not present

## 2015-06-15 DIAGNOSIS — S6992XA Unspecified injury of left wrist, hand and finger(s), initial encounter: Secondary | ICD-10-CM | POA: Diagnosis not present

## 2015-06-27 DIAGNOSIS — I480 Paroxysmal atrial fibrillation: Secondary | ICD-10-CM | POA: Diagnosis not present

## 2015-06-27 DIAGNOSIS — I442 Atrioventricular block, complete: Secondary | ICD-10-CM | POA: Diagnosis not present

## 2015-06-27 DIAGNOSIS — Z45018 Encounter for adjustment and management of other part of cardiac pacemaker: Secondary | ICD-10-CM | POA: Diagnosis not present

## 2015-07-03 DIAGNOSIS — Z0000 Encounter for general adult medical examination without abnormal findings: Secondary | ICD-10-CM | POA: Diagnosis not present

## 2015-07-13 DIAGNOSIS — M25512 Pain in left shoulder: Secondary | ICD-10-CM | POA: Diagnosis not present

## 2015-07-13 DIAGNOSIS — J028 Acute pharyngitis due to other specified organisms: Secondary | ICD-10-CM | POA: Diagnosis not present

## 2015-07-18 DIAGNOSIS — R0602 Shortness of breath: Secondary | ICD-10-CM | POA: Diagnosis not present

## 2015-07-18 DIAGNOSIS — Z79899 Other long term (current) drug therapy: Secondary | ICD-10-CM | POA: Diagnosis not present

## 2015-07-18 DIAGNOSIS — I517 Cardiomegaly: Secondary | ICD-10-CM | POA: Diagnosis not present

## 2015-07-18 DIAGNOSIS — I272 Other secondary pulmonary hypertension: Secondary | ICD-10-CM | POA: Diagnosis not present

## 2015-07-18 DIAGNOSIS — I4891 Unspecified atrial fibrillation: Secondary | ICD-10-CM | POA: Diagnosis not present

## 2015-08-08 DIAGNOSIS — R928 Other abnormal and inconclusive findings on diagnostic imaging of breast: Secondary | ICD-10-CM | POA: Diagnosis not present

## 2015-08-08 DIAGNOSIS — C50411 Malignant neoplasm of upper-outer quadrant of right female breast: Secondary | ICD-10-CM | POA: Diagnosis not present

## 2015-08-08 DIAGNOSIS — Z9889 Other specified postprocedural states: Secondary | ICD-10-CM | POA: Diagnosis not present

## 2015-08-09 DIAGNOSIS — C50411 Malignant neoplasm of upper-outer quadrant of right female breast: Secondary | ICD-10-CM | POA: Diagnosis not present

## 2015-08-10 DIAGNOSIS — C50411 Malignant neoplasm of upper-outer quadrant of right female breast: Secondary | ICD-10-CM | POA: Diagnosis not present

## 2015-08-10 DIAGNOSIS — E559 Vitamin D deficiency, unspecified: Secondary | ICD-10-CM | POA: Diagnosis not present

## 2015-08-10 DIAGNOSIS — Z79811 Long term (current) use of aromatase inhibitors: Secondary | ICD-10-CM | POA: Diagnosis not present

## 2015-08-11 DIAGNOSIS — D638 Anemia in other chronic diseases classified elsewhere: Secondary | ICD-10-CM | POA: Insufficient documentation

## 2015-08-11 DIAGNOSIS — Z6832 Body mass index (BMI) 32.0-32.9, adult: Secondary | ICD-10-CM | POA: Insufficient documentation

## 2015-08-11 DIAGNOSIS — M8580 Other specified disorders of bone density and structure, unspecified site: Secondary | ICD-10-CM | POA: Insufficient documentation

## 2015-08-11 DIAGNOSIS — M1990 Unspecified osteoarthritis, unspecified site: Secondary | ICD-10-CM | POA: Insufficient documentation

## 2015-08-13 DIAGNOSIS — D649 Anemia, unspecified: Secondary | ICD-10-CM | POA: Diagnosis not present

## 2015-08-13 DIAGNOSIS — Z780 Asymptomatic menopausal state: Secondary | ICD-10-CM | POA: Diagnosis not present

## 2015-08-13 DIAGNOSIS — M810 Age-related osteoporosis without current pathological fracture: Secondary | ICD-10-CM | POA: Diagnosis not present

## 2015-08-13 DIAGNOSIS — M85852 Other specified disorders of bone density and structure, left thigh: Secondary | ICD-10-CM | POA: Diagnosis not present

## 2015-08-13 DIAGNOSIS — Z96611 Presence of right artificial shoulder joint: Secondary | ICD-10-CM | POA: Diagnosis not present

## 2015-08-13 DIAGNOSIS — M109 Gout, unspecified: Secondary | ICD-10-CM | POA: Diagnosis not present

## 2015-08-13 DIAGNOSIS — Z79899 Other long term (current) drug therapy: Secondary | ICD-10-CM | POA: Diagnosis not present

## 2015-08-13 DIAGNOSIS — Z96612 Presence of left artificial shoulder joint: Secondary | ICD-10-CM | POA: Diagnosis not present

## 2015-08-13 DIAGNOSIS — Z853 Personal history of malignant neoplasm of breast: Secondary | ICD-10-CM | POA: Diagnosis not present

## 2015-08-13 DIAGNOSIS — Z96653 Presence of artificial knee joint, bilateral: Secondary | ICD-10-CM | POA: Diagnosis not present

## 2015-08-13 DIAGNOSIS — I10 Essential (primary) hypertension: Secondary | ICD-10-CM | POA: Diagnosis not present

## 2015-08-16 DIAGNOSIS — C50411 Malignant neoplasm of upper-outer quadrant of right female breast: Secondary | ICD-10-CM | POA: Diagnosis not present

## 2015-08-16 DIAGNOSIS — M1990 Unspecified osteoarthritis, unspecified site: Secondary | ICD-10-CM | POA: Diagnosis not present

## 2015-08-16 DIAGNOSIS — D638 Anemia in other chronic diseases classified elsewhere: Secondary | ICD-10-CM | POA: Diagnosis not present

## 2015-08-16 DIAGNOSIS — E559 Vitamin D deficiency, unspecified: Secondary | ICD-10-CM | POA: Diagnosis not present

## 2015-08-16 DIAGNOSIS — N183 Chronic kidney disease, stage 3 (moderate): Secondary | ICD-10-CM | POA: Diagnosis not present

## 2015-08-16 DIAGNOSIS — Z79811 Long term (current) use of aromatase inhibitors: Secondary | ICD-10-CM | POA: Diagnosis not present

## 2015-09-24 DIAGNOSIS — J208 Acute bronchitis due to other specified organisms: Secondary | ICD-10-CM | POA: Diagnosis not present

## 2015-09-27 DIAGNOSIS — S4992XA Unspecified injury of left shoulder and upper arm, initial encounter: Secondary | ICD-10-CM | POA: Diagnosis not present

## 2015-09-27 DIAGNOSIS — Z950 Presence of cardiac pacemaker: Secondary | ICD-10-CM | POA: Diagnosis not present

## 2015-09-27 DIAGNOSIS — M5412 Radiculopathy, cervical region: Secondary | ICD-10-CM | POA: Diagnosis not present

## 2015-09-27 DIAGNOSIS — I10 Essential (primary) hypertension: Secondary | ICD-10-CM | POA: Diagnosis not present

## 2015-09-27 DIAGNOSIS — R079 Chest pain, unspecified: Secondary | ICD-10-CM | POA: Diagnosis not present

## 2015-09-27 DIAGNOSIS — Z853 Personal history of malignant neoplasm of breast: Secondary | ICD-10-CM | POA: Diagnosis not present

## 2015-09-27 DIAGNOSIS — M109 Gout, unspecified: Secondary | ICD-10-CM | POA: Diagnosis not present

## 2015-09-27 DIAGNOSIS — M25512 Pain in left shoulder: Secondary | ICD-10-CM | POA: Diagnosis not present

## 2015-12-14 DIAGNOSIS — J0111 Acute recurrent frontal sinusitis: Secondary | ICD-10-CM | POA: Diagnosis not present

## 2015-12-14 DIAGNOSIS — M79675 Pain in left toe(s): Secondary | ICD-10-CM | POA: Diagnosis not present

## 2015-12-19 ENCOUNTER — Telehealth (HOSPITAL_COMMUNITY): Payer: Self-pay

## 2015-12-19 ENCOUNTER — Other Ambulatory Visit (HOSPITAL_COMMUNITY): Payer: Self-pay | Admitting: Oncology

## 2015-12-19 DIAGNOSIS — I10 Essential (primary) hypertension: Secondary | ICD-10-CM | POA: Diagnosis not present

## 2015-12-19 DIAGNOSIS — E782 Mixed hyperlipidemia: Secondary | ICD-10-CM | POA: Diagnosis not present

## 2015-12-19 DIAGNOSIS — C50919 Malignant neoplasm of unspecified site of unspecified female breast: Secondary | ICD-10-CM

## 2015-12-19 DIAGNOSIS — Z131 Encounter for screening for diabetes mellitus: Secondary | ICD-10-CM | POA: Diagnosis not present

## 2015-12-19 MED ORDER — ANASTROZOLE 1 MG PO TABS: 1.0000 mg | ORAL_TABLET | Freq: Every day | ORAL | 3 refills | Status: DC

## 2015-12-19 NOTE — Telephone Encounter (Signed)
Prescription sent to [facility]. Patient made aware.

## 2015-12-19 NOTE — Telephone Encounter (Signed)
-----  Message from Baird Cancer, PA-C sent at 12/19/2015  3:30 PM EDT ----- Regarding: FW: refill Please refills anastrozole daily.  #30 with 3 refills.  TK ----- Message ----- From: Louis Meckel Sent: 12/19/2015   2:18 PM To: Baird Cancer, PA-C Subject: refill                                         Patient needs refill on anastrozole 51m/  [employer]

## 2015-12-26 DIAGNOSIS — I480 Paroxysmal atrial fibrillation: Secondary | ICD-10-CM | POA: Diagnosis not present

## 2015-12-26 DIAGNOSIS — I442 Atrioventricular block, complete: Secondary | ICD-10-CM | POA: Diagnosis not present

## 2015-12-26 DIAGNOSIS — Z45018 Encounter for adjustment and management of other part of cardiac pacemaker: Secondary | ICD-10-CM | POA: Diagnosis not present

## 2015-12-26 DIAGNOSIS — Z950 Presence of cardiac pacemaker: Secondary | ICD-10-CM | POA: Diagnosis not present

## 2016-01-15 ENCOUNTER — Encounter (HOSPITAL_COMMUNITY): Payer: Medicare Other | Attending: Oncology | Admitting: Oncology

## 2016-01-15 ENCOUNTER — Encounter (HOSPITAL_COMMUNITY): Payer: Self-pay | Admitting: Oncology

## 2016-01-15 VITALS — BP 153/55 | HR 61 | Temp 99.1°F | Resp 18 | Ht 64.0 in | Wt 185.0 lb

## 2016-01-15 DIAGNOSIS — M8580 Other specified disorders of bone density and structure, unspecified site: Secondary | ICD-10-CM

## 2016-01-15 DIAGNOSIS — C50411 Malignant neoplasm of upper-outer quadrant of right female breast: Secondary | ICD-10-CM | POA: Diagnosis not present

## 2016-01-15 DIAGNOSIS — Z23 Encounter for immunization: Secondary | ICD-10-CM

## 2016-01-15 DIAGNOSIS — Z79811 Long term (current) use of aromatase inhibitors: Secondary | ICD-10-CM

## 2016-01-15 DIAGNOSIS — E559 Vitamin D deficiency, unspecified: Secondary | ICD-10-CM

## 2016-01-15 DIAGNOSIS — C50911 Malignant neoplasm of unspecified site of right female breast: Secondary | ICD-10-CM

## 2016-01-15 DIAGNOSIS — Z0000 Encounter for general adult medical examination without abnormal findings: Secondary | ICD-10-CM

## 2016-01-15 MED ORDER — INFLUENZA VAC SPLIT QUAD 0.5 ML IM SUSY
0.5000 mL | PREFILLED_SYRINGE | Freq: Once | INTRAMUSCULAR | Status: AC
  Administered 2016-01-15: 0.5 mL via INTRAMUSCULAR
  Filled 2016-01-15: qty 0.5

## 2016-01-15 NOTE — Assessment & Plan Note (Addendum)
Right breast lobular carcinoma of upper outer quadrant, Stage I, ER/PR POSITIVE, HER2 NEGATIVE.  Treated definitively with lumpectomy on 09/20/2014 by Dr. DeMason.  Now on Arimidex beginning on 09/27/2014.  She did not meet a Rad Oncologist at time of diagnosis and not offered XRT following surgery.  Oncology history developed.  No labs today.  She notes that her PCP, Dr. Hasanaj performed labs recently.  We will get a copy of these labs.  Labs in 3 months: CBC diff, CMET, Vit D.  Continue Arimidex daily and compliance is encouraged.  There are a few pieces of information that we will need to get with regards to her cancer care.  As a result, we will ask her to sign a release of authorization to ascertain:  A. All imaging from Jan 2016- present, including bone density exam and mammogram at time of diagnosis.  B. Pathology from her lumpectomy.  She will be due for her next mammogram in April 2018.  She is agreeable to influenza vaccine today.  Return in 3 months for follow-up. 

## 2016-01-15 NOTE — Progress Notes (Signed)
Pt given flu vaccine in left deltoid with no complications. Pt given vaccination handout and verbalized understanding.

## 2016-01-15 NOTE — Patient Instructions (Addendum)
[place] at [facility] Discharge Instructions  RECOMMENDATIONS MADE BY THE CONSULTANT AND ANY TEST RESULTS WILL BE SENT TO YOUR REFERRING PHYSICIAN.  You were seen by Gershon Mussel today. Need to sign a release to get records from [place] Follow up in 12 weeks Continue Aramidex daily Labs in 3 months Mamogram  April 2018 Flu shot today Please call the center with any related concerns  Thank you for choosing [place] at [facility] to provide your oncology and hematology care.  To afford each patient quality time with our provider, please arrive at least 15 minutes before your scheduled appointment time.   Beginning January 23rd 2017 lab work for the [facility] will be done in the  Main lab at [facility] on 1st floor. If you have a lab appointment with the [facility] please come in thru the  Main Entrance and check in at the main information desk  You need to re-schedule your appointment should you arrive 10 or more minutes late.  We strive to give you quality time with our providers, and arriving late affects you and other patients whose appointments are after yours.  Also, if you no show three or more times for appointments you may be dismissed from the clinic at the providers discretion.     Again, thank you for choosing [facility]  Our hope is that these requests will decrease the amount of time that you wait before being seen by our physicians.       _____________________________________________________________  Should you have questions after your visit to [facility], please contact our office at [phone] between the hours of 8:30 a.m. and 4:30 p.m.  Voicemails left after 4:30 p.m. will not be returned until the following business day.  For prescription refill requests, have your pharmacy contact our office.         Resources For Cancer Patients and their Caregivers ? American Cancer Society: Can  assist with transportation, wigs, general needs, runs Look Good Feel Better.        [phone] ? Cancer Care: Provides financial assistance, online support groups, medication/co-pay assistance.  1-800-813-HOPE 204-359-4069) ? [place] Assists [place] cancer patients and their families through emotional , educational and financial support.  ([phone] ? [place] DSS Where to apply for food stamps, Medicaid and utility assistance. 435-498-2281 ? RCATS: Transportation to medical appointments. (360)617-9485 ? Social Security Administration: May apply for disability if have a Stage IV cancer. 843 354 1477 (316)171-3286 ? [employer], Disability and Transit Services: Assists with nutrition, care and transit needs. [place] Support Programs: _0 @ > Cancer Support Group  2nd Tuesday of the month 1pm-2pm, Journey Room  > Creative Journey  3rd Tuesday of the month 1130am-1pm, Journey Room  > Look Good Feel Better  1st Wednesday of the month 10am-12 noon, Journey Room (Call [place] to register [phone])

## 2016-01-15 NOTE — Progress Notes (Signed)
[facility] Hematology/Oncology Consultation   Name: Alicia Nash      MRN: [id number]   Date: 01/15/2016 Time:11:16 PM   REFERRING PHYSICIAN:  Audree Camel, MD (Medical Oncology at [facility])  [facility]:  Transfer of medical oncology care.   DIAGNOSIS:  Stage I Lobular carcinoma of right breast, ER/PR+, HER2 NEGATIVE.    Lobular carcinoma of right breast ([place])   08/30/2014 Mammogram    Ill-defined shadowing mass at 10:00 position 6 cm from the nipple measuring [phone] cm in size without lymphadenopathy seen in the right axilla.      09/06/2014 Procedure    Biopsy of right breast mass at 10 o'clock position.      09/08/2014 Pathology Results    Invasive carcinoma, favor invasive ductal carcinoma.  Some features are suggestive of lobular carcinoma.  Negative e-cadherin immunostaining throughout the lesional cells supports the diagnosis of invasive lobular carcinoma.      09/08/2014 Pathology Results    ER 90%, PR 90%. HER2 FISH negative, Ki-67 26%.      09/20/2014 Definitive Surgery    Lumpectomy      09/20/2014 Pathology Results    Invasive lobular carcinoma, 0.6 cm in maximal dimension, closest margin is 0.3 cm at posterior resection margin. Grade 2. No LV I. 0/1 lymph nodes.      09/27/2014 -  Anti-estrogen oral therapy    Arimidex      08/08/2015 Mammogram    BIRADS 2      09/20/2015 Procedure    Right breast lumpectomy by Dr. Anthony Sar with sentinel lymph node biopsy.       HISTORY OF PRESENT ILLNESS:   Alicia Nash is a 75 y.o. female with a medical history significant for anemia of chronic renal disease, arthritis, stage III chronic renal disease, complete heart block, osteopenia who is referred to the [facility] for transfer of medical oncology care for Right breast lobular carcinoma of upper outer quadrant, Stage I, ER/PR POSITIVE, HER2 NEGATIVE.  Treated definitively with lumpectomy on 09/20/2014 by Dr. Anthony Sar.  Now on  Arimidex beginning on 09/27/2014.  The patient denies any complaints. She notes that she is compliant with her arimidex. Reviewed some of the risks, benefits, alternatives, and side effects of aromatase inhibitors These include, but are not limited to, arthralgias, myalgias, hot flashes, vaginal bleeding, secondary malignancy (endometrial carcinoma), and osteopenia. She is up to date on mammography.   She reports compliance with this medication as well as excellent tolerance.  Review of systems is completely negative.  She remains very independent and lives by herself. She notes that she continues to mow her own grass. She has excellent family support as well.  Last seen by Dr. Jacquiline Doe in April at [facility]   Review of Systems  Constitutional: Negative.  Negative for chills, fever and weight loss.  HENT: Negative.   Eyes: Negative.  Negative for blurred vision and double vision.  Respiratory: Negative.  Negative for cough, hemoptysis, sputum production and shortness of breath.   Cardiovascular: Negative.  Negative for chest pain.  Gastrointestinal: Negative.  Negative for abdominal pain, blood in stool, constipation, diarrhea, melena, nausea and vomiting.  Genitourinary: Negative.   Musculoskeletal: Negative.  Negative for joint pain and myalgias.  Skin: Negative.  Negative for rash.  Neurological: Negative.  Negative for weakness and headaches.  Endo/Heme/Allergies: Negative.   Psychiatric/Behavioral: Negative.   14 point review of systems was performed and is negative except  as detailed under history of present illness and above   PAST MEDICAL HISTORY:   Past Medical History:  Diagnosis Date  . Arthritis   . Breast cancer ([place])   . Cellulitis    Right leg  . CHF (congestive heart failure) ([place])   . Hyperlipidemia   . Hypertension   . Lobular carcinoma of right breast ([place]) 09/27/2014  . Malignant neoplasm of upper-outer quadrant of right female breast ([place]) 09/27/2014  . Varicose  veins    venous stasis skin changes    ALLERGIES: Allergies  Allergen Reactions  . Dihydrocodeine Other (See Comments)  . Other     Blisters after wear compression stockings      MEDICATIONS: I have reviewed the patient's current medications.    Current Outpatient Prescriptions on File Prior to Visit  Medication Sig Dispense Refill  . AMLODIPINE BESYLATE PO Take by mouth.    Marland Kitchen anastrozole (ARIMIDEX) 1 MG tablet Take 1 tablet (1 mg total) by mouth daily. 30 tablet 3  . aspirin 81 MG tablet Take 81 mg by mouth daily.    Marland Kitchen BENAZEPRIL-HYDROCHLOROTHIAZIDE PO Take by mouth.    Marland Kitchen CITALOPRAM HYDROBROMIDE PO Take by mouth.    Marland Kitchen DIAZEPAM PO Take by mouth.    . DICLOFENAC POTASSIUM PO Take by mouth.    [name] Calcium (STOOL SOFTENER PO) Take by mouth.    . Fenofibrate (TRICOR PO) Take by mouth.    . Multiple Vitamin (MULTIVITAMIN) tablet Take 1 tablet by mouth daily.    Marland Kitchen omeprazole (PRILOSEC) 40 MG capsule Take 40 mg by mouth daily.     No current facility-administered medications on file prior to visit.      PAST SURGICAL HISTORY Past Surgical History:  Procedure Laterality Date  . ABDOMINAL HYSTERECTOMY    . CHOLECYSTECTOMY    . FOOT SURGERY     Multiple foot surgeries by Dr. Blanch Media  . JOINT REPLACEMENT     bilateral knee by Drs. McGee and [place]  . SHOULDER SURGERY      FAMILY HISTORY: Family History  Problem Relation Age of Onset  . Hypertension Other   . Diabetes Mother   She has 4 boys and 4 daughters. She has 10 grandchildren and 5 great-grandchildren.  SOCIAL HISTORY:  reports that she has never smoked. She has never used smokeless tobacco. She reports that she does not drink alcohol or use drugs. He is divorced 34 years after being married for 33 years. She associates herself as being a Psychologist, forensic. She is retired from [employer] after working there in the [place] History  . Marital status: Divorced    Spouse name:  N/A  . Number of children: N/A  . Years of education: N/A   Social History Main Topics  . Smoking status: Never Smoker  . Smokeless tobacco: Never Used  . Alcohol use No  . Drug use: No  . Sexual activity: Not Asked   Other Topics Concern  . None   Social History Narrative  . None    PERFORMANCE STATUS: The patient's performance status is 0 - Asymptomatic  PHYSICAL EXAM: Most Recent Vital Signs: Blood pressure (!) 153/55, pulse 61, temperature 99.1 F (37.3 C), temperature source Oral, resp. rate 18, height _0  (1.626 m), weight 185 lb (83.9 kg), SpO2 96 %. General appearance: alert, cooperative, no distress, moderately obese and unaccompanied Head: Normocephalic, without obvious abnormality, atraumatic Eyes: negative findings: lids and lashes normal, conjunctivae  and sclerae normal and corneas clear Throat: normal findings: lips normal without lesions, buccal mucosa normal, tongue midline and normal and oropharynx pink & moist without lesions or evidence of thrush and abnormal findings: dentition: upper dentures Neck: no adenopathy, supple, symmetrical, trachea midline and thyroid not enlarged, symmetric, no tenderness/mass/nodules Back: symmetric, no curvature. ROM normal. No CVA tenderness. Lungs: clear to auscultation bilaterally and normal percussion bilaterally Breasts: negative findings: normal contour with no evidence of flattening or dimpling, skin normal, nipples everted without rashes or discharge, palpation negative for masses or nodules, no palpable axillary lymphadenopathy, positive findings: right upper outer quadrant lumpectomy scar noted, well healed Heart: regular rate and rhythm, S1, S2 normal, no murmur, click, rub or gallop Abdomen: soft, non-tender; bowel sounds normal; no masses,  no organomegaly Extremities: extremities normal, atraumatic, no cyanosis or edema Skin: Skin color, texture, turgor normal. No rashes or lesions Lymph nodes: Cervical,  supraclavicular, and axillary nodes normal. Neurologic: Grossly normal  LABORATORY DATA:  Labs performed on 12/19/2015 by primary care physician: Glucose: 86 BUN: 24 Creatinine 1.44 GFR: 41 Potassium 4.5 HGB A1c 5.9%  RADIOGRAPHY:      PATHOLOGY:                       ASSESSMENT/PLAN:   Lobular carcinoma of right breast  Osteopenia [place] term use aromatase inhibitor Hx vitamin D deficiency  Right breast lobular carcinoma of upper outer quadrant, Stage I, ER/PR POSITIVE, HER2 NEGATIVE.  Treated definitively with lumpectomy on 09/20/2014 by Dr. Anthony Sar.  Now on Arimidex beginning on 09/27/2014.  She did not meet a Rad Oncologist at time of diagnosis and not offered XRT following surgery.  Oncology history developed.  No labs today.  She notes that her PCP, Dr. Sherrie Sport performed labs recently.  We will get a copy of these labs.  Labs in 3 months: CBC diff, CMET, Vit D.  Continue Arimidex daily and compliance is encouraged.  There are a few pieces of information that we will need to get with regards to her cancer care.  As a result, we will ask her to sign a release of authorization to ascertain:  A. All imaging from Jan 2016- present, including bone density exam and mammogram at time of diagnosis.  B. Pathology from her lumpectomy.  She will be due for her next mammogram in April 2018.  She is agreeable to influenza vaccine today.  Return in 3 months for follow-up.   ORDERS PLACED FOR THIS ENCOUNTER: Orders Placed This Encounter  Procedures  . MM DIAG BREAST TOMO BILATERAL  . US BREAST LTD UNI RIGHT INC AXILLA  . US BREAST LTD UNI LEFT INC AXILLA  . CBC with Differential  . Comprehensive metabolic panel  . Vitamin D 25 hydroxy    MEDICATIONS PRESCRIBED THIS ENCOUNTER: Meds ordered this encounter  Medications  . amiodarone (PACERONE) 200 MG tablet  . benazepril (LOTENSIN) 40 MG tablet  . citalopram (CELEXA) 20 MG tablet  . diazepam (VALIUM)  5 MG tablet  . doxazosin (CARDURA) 2 MG tablet  . ULORIC 40 MG tablet  . furosemide (LASIX) 40 MG tablet  . verapamil (CALAN-SR) 240 MG CR tablet  . pantoprazole (PROTONIX) 20 MG tablet  . ipratropium-albuterol (DUONEB) 0.5-2.5 (3) MG/3ML SOLN  . gemfibrozil (LOPID) 600 MG tablet  . Influenza vac split quadrivalent PF (FLUARIX) injection 0.5 mL    All questions were answered. The patient knows to call the clinic with any problems, questions or concerns. We  can certainly see the patient much sooner if necessary.  This note is electronically signed [id number] Cyril Mourning, MD  01/15/2016 11:16 PM

## 2016-01-28 ENCOUNTER — Encounter (HOSPITAL_COMMUNITY): Payer: Self-pay | Admitting: Oncology

## 2016-02-20 DIAGNOSIS — Z950 Presence of cardiac pacemaker: Secondary | ICD-10-CM | POA: Diagnosis not present

## 2016-02-20 DIAGNOSIS — I4891 Unspecified atrial fibrillation: Secondary | ICD-10-CM | POA: Diagnosis not present

## 2016-02-20 DIAGNOSIS — Z79899 Other long term (current) drug therapy: Secondary | ICD-10-CM | POA: Diagnosis not present

## 2016-03-12 ENCOUNTER — Other Ambulatory Visit (HOSPITAL_COMMUNITY): Payer: Self-pay | Admitting: Oncology

## 2016-03-12 DIAGNOSIS — C50919 Malignant neoplasm of unspecified site of unspecified female breast: Secondary | ICD-10-CM

## 2016-03-26 DIAGNOSIS — K210 Gastro-esophageal reflux disease with esophagitis: Secondary | ICD-10-CM | POA: Diagnosis not present

## 2016-03-26 DIAGNOSIS — I10 Essential (primary) hypertension: Secondary | ICD-10-CM | POA: Diagnosis not present

## 2016-04-15 ENCOUNTER — Encounter (HOSPITAL_COMMUNITY): Payer: Medicare Other

## 2016-04-15 ENCOUNTER — Encounter (HOSPITAL_COMMUNITY): Payer: Medicare Other | Attending: Oncology | Admitting: Oncology

## 2016-04-15 ENCOUNTER — Encounter (HOSPITAL_COMMUNITY): Payer: Self-pay | Admitting: Oncology

## 2016-04-15 VITALS — BP 140/61 | HR 62 | Temp 98.5°F | Resp 18 | Wt 188.8 lb

## 2016-04-15 DIAGNOSIS — C50911 Malignant neoplasm of unspecified site of right female breast: Secondary | ICD-10-CM | POA: Diagnosis not present

## 2016-04-15 DIAGNOSIS — Z170 Estrogen receptor positive status [ER+]: Secondary | ICD-10-CM

## 2016-04-15 DIAGNOSIS — C50411 Malignant neoplasm of upper-outer quadrant of right female breast: Secondary | ICD-10-CM

## 2016-04-15 LAB — CBC WITH DIFFERENTIAL/PLATELET
Basophils Absolute: 0.1 10*3/uL (ref 0.0–0.1)
Basophils Relative: 1 %
Eosinophils Absolute: 0.4 10*3/uL (ref 0.0–0.7)
Eosinophils Relative: 5 %
HCT: 35.8 % — ABNORMAL LOW (ref 36.0–46.0)
Hemoglobin: 11.1 g/dL — ABNORMAL LOW (ref 12.0–15.0)
Lymphocytes Relative: 20 %
Lymphs Abs: 1.5 10*3/uL (ref 0.7–4.0)
MCH: 28.8 pg (ref 26.0–34.0)
MCHC: 31 g/dL (ref 30.0–36.0)
MCV: 93 fL (ref 78.0–100.0)
Monocytes Absolute: 0.7 10*3/uL (ref 0.1–1.0)
Monocytes Relative: 9 %
Neutro Abs: 5 10*3/uL (ref 1.7–7.7)
Neutrophils Relative %: 65 %
Platelets: 264 10*3/uL (ref 150–400)
RBC: 3.85 MIL/uL — ABNORMAL LOW (ref 3.87–5.11)
RDW: 13.9 % (ref 11.5–15.5)
WBC: 7.7 10*3/uL (ref 4.0–10.5)

## 2016-04-15 LAB — COMPREHENSIVE METABOLIC PANEL
ALT: 17 U/L (ref 14–54)
AST: 19 U/L (ref 15–41)
Albumin: 4 g/dL (ref 3.5–5.0)
Alkaline Phosphatase: 96 U/L (ref 38–126)
Anion gap: 9 (ref 5–15)
BUN: 18 mg/dL (ref 6–20)
CO2: 27 mmol/L (ref 22–32)
Calcium: 8.9 mg/dL (ref 8.9–10.3)
Chloride: 102 mmol/L (ref 101–111)
Creatinine, Ser: 1.35 mg/dL — ABNORMAL HIGH (ref 0.44–1.00)
GFR calc Af Amer: 43 mL/min — ABNORMAL LOW (ref >60)
GFR calc non Af Amer: 37 mL/min — ABNORMAL LOW (ref >60)
Glucose, Bld: 92 mg/dL (ref 65–99)
Potassium: 3.7 mmol/L (ref 3.5–5.1)
Sodium: 138 mmol/L (ref 135–145)
Total Bilirubin: 0.4 mg/dL (ref 0.3–1.2)
Total Protein: 7.3 g/dL (ref 6.5–8.1)

## 2016-04-15 NOTE — Patient Instructions (Signed)
[place] at [facility] Discharge Instructions  RECOMMENDATIONS MADE BY THE CONSULTANT AND ANY TEST RESULTS WILL BE SENT TO YOUR REFERRING PHYSICIAN.  You were seen today by Kirby Crigler PA-C. Labs today, will call with results. Mammogram in April as scheduled. Return in 4 months for labs and follow up.  Second to [place] [phone]  Thank you for choosing [place] at [facility] to provide your oncology and hematology care.  To afford each patient quality time with our provider, please arrive at least 15 minutes before your scheduled appointment time.   Beginning January 23rd 2017 lab work for the [facility] will be done in the  Main lab at [facility] on 1st floor. If you have a lab appointment with the [facility] please come in thru the  Main Entrance and check in at the main information desk  You need to re-schedule your appointment should you arrive 10 or more minutes late.  We strive to give you quality time with our providers, and arriving late affects you and other patients whose appointments are after yours.  Also, if you no show three or more times for appointments you may be dismissed from the clinic at the providers discretion.     Again, thank you for choosing [facility].  Our hope is that these requests will decrease the amount of time that you wait before being seen by our physicians.       _____________________________________________________________  Should you have questions after your visit to [facility], please contact our office at [phone] between the hours of 8:30 a.m. and 4:30 p.m.  Voicemails left after 4:30 p.m. will not be returned until the following business day.  For prescription refill requests, have your pharmacy contact our office.         Resources For Cancer Patients and their Caregivers ? American Cancer Society: Can assist with  transportation, wigs, general needs, runs Look Good Feel Better.        [phone] ? Cancer Care: Provides financial assistance, online support groups, medication/co-pay assistance.  1-800-813-HOPE 757-772-2543) ? [place] Assists [place] cancer patients and their families through emotional , educational and financial support.  563-069-9316 ? [place] DSS Where to apply for food stamps, Medicaid and utility assistance. 628-709-7345 ? RCATS: Transportation to medical appointments. 217-316-5810 ? Social Security Administration: May apply for disability if have a Stage IV cancer. 248-577-1155 [phone] ? [employer], Disability and Transit Services: Assists with nutrition, care and transit needs. [place] Support Programs: _0 @ > Cancer Support Group  2nd Tuesday of the month 1pm-2pm, Journey Room  > Creative Journey  3rd Tuesday of the month 1130am-1pm, Journey Room  > Look Good Feel Better  1st Wednesday of the month 10am-12 noon, Journey Room (Call [place] to register [phone])

## 2016-04-15 NOTE — Assessment & Plan Note (Addendum)
Right breast lobular carcinoma of upper outer quadrant, Stage I, ER/PR POSITIVE, HER2 NEGATIVE.  Treated definitively with lumpectomy on 09/20/2014 by Dr. Anthony Sar.  Now on Arimidex beginning on 09/27/2014.  She did not meet a Rad Oncologist at time of diagnosis and not offered XRT following surgery.  She was initially managed by Dr. Jacquiline Doe at [facility] until she transferred her medical oncology care on 01/15/2016.   Labs today: CBC diff, CMET, Vit D.  I personally reviewed and went over laboratory results with the patient.  The results are noted within this dictation.  Mammogram is scheduled for 08/12/2016.  Labs in 3-4 months: CBC diff, CMET, Vit D.    She is having difficulty with bras fitting and not causing discomfort.  I have given her information regarding [facility] in [place] to see if they can offer her any solutions to this issue.  Return in 3-4 months for follow-up after mammogram.

## 2016-04-15 NOTE — Progress Notes (Signed)
[name], [place] [address]  Lobular carcinoma of right breast [facility]) - Plan: CBC with Differential, Comprehensive metabolic panel, Vitamin D 25 hydroxy  CURRENT THERAPY: Arimidex beginning on 09/27/2014.  INTERVAL HISTORY: Alicia Nash 75 y.o. female returns for followup of Right breast lobular carcinoma of upper outer quadrant, Stage I, ER/PR POSITIVE, HER2 NEGATIVE.  Treated definitively with lumpectomy on 09/20/2014 by Dr. Anthony Sar.  Now on Arimidex beginning on 09/27/2014.  She did not meet a Rad Oncologist at time of diagnosis and not offered XRT following surgery.  She was initially managed by Dr. Jacquiline Doe at [facility] until she transferred her medical oncology care on 01/15/2016.     Lobular carcinoma of right breast ([place])   08/30/2014 Mammogram    Ill-defined shadowing mass at 10:00 position 6 cm from the nipple measuring [phone] cm in size without lymphadenopathy seen in the right axilla.      09/06/2014 Procedure    Biopsy of right breast mass at 10 o'clock position.      09/08/2014 Pathology Results    Invasive carcinoma, favor invasive ductal carcinoma.  Some features are suggestive of lobular carcinoma.  Negative e-cadherin immunostaining throughout the lesional cells supports the diagnosis of invasive lobular carcinoma.      09/08/2014 Pathology Results    ER 90%, PR 90%. HER2 FISH negative, Ki-67 26%.      09/20/2014 Definitive Surgery    Lumpectomy      09/20/2014 Pathology Results    Invasive lobular carcinoma, 0.6 cm in maximal dimension, closest margin is 0.3 cm at posterior resection margin. Grade 2. No LV I. 0/1 lymph nodes.      09/27/2014 -  Anti-estrogen oral therapy    Arimidex      08/08/2015 Mammogram    BIRADS 2      09/20/2015 Procedure    Right breast lumpectomy by Dr. Anthony Sar with sentinel lymph node biopsy.      She denies any complaints.  She is tolerating Arimidex well without any issues such as hot  flashes, arthralgias, and myalgias.  She reports compliance with this medication as well.  She denies any complaints.  She is doing self-breast exams and reports no issues with her breasts.  She notes difficulty finding a fitting bra.  I have offered her information regarding [facility].  I am not sure they can help in this situation, but the patient is provided their contact information.  Review of Systems  Constitutional: Negative.  Negative for chills, fever, malaise/fatigue and weight loss.  HENT: Negative.   Eyes: Negative.   Respiratory: Negative.  Negative for cough.   Cardiovascular: Negative.  Negative for chest pain.  Gastrointestinal: Negative.  Negative for constipation, diarrhea, nausea and vomiting.  Genitourinary: Negative.   Musculoskeletal: Negative.   Skin: Negative.   Neurological: Negative.  Negative for weakness.  Endo/Heme/Allergies: Negative.   Psychiatric/Behavioral: Negative.     Past Medical History:  Diagnosis Date  . Arthritis   . Breast cancer ([place])   . Cellulitis    Right leg  . CHF (congestive heart failure) ([place])   . Hyperlipidemia   . Hypertension   . Lobular carcinoma of right breast ([place]) 09/27/2014  . Malignant neoplasm of upper-outer quadrant of right female breast ([place]) 09/27/2014  . Varicose veins    venous stasis skin changes    Past Surgical History:  Procedure Laterality Date  . ABDOMINAL HYSTERECTOMY    .  CHOLECYSTECTOMY    . FOOT SURGERY     Multiple foot surgeries by Dr. Blanch Media  . JOINT REPLACEMENT     bilateral knee by Drs. McGee and [place]  . SHOULDER SURGERY      Family History  Problem Relation Age of Onset  . Hypertension Other   . Diabetes Mother     Social History   Social History  . Marital status: Divorced    Spouse name: N/A  . Number of children: N/A  . Years of education: N/A   Social History Main Topics  . Smoking status: Never Smoker  . Smokeless tobacco: Never Used  . Alcohol use No  .  Drug use: No  . Sexual activity: Not Asked   Other Topics Concern  . None   Social History Narrative  . None     PHYSICAL EXAMINATION  ECOG PERFORMANCE STATUS: 0 - Asymptomatic  Vitals:   04/15/16 1355  BP: 140/61  Pulse: 62  Resp: 18  Temp: 98.5 F (36.9 C)    GENERAL:alert, no distress, well nourished, well developed, comfortable, cooperative, obese, smiling and unaccompanied SKIN: skin color, texture, turgor are normal, no rashes or significant lesions HEAD: Normocephalic, No masses, lesions, tenderness or abnormalities EYES: normal, EOMI, Conjunctiva are pink and non-injected EARS: External ears normal OROPHARYNX:lips, buccal mucosa, and tongue normal and mucous membranes are moist  NECK: supple, trachea midline LYMPH:  no palpable lymphadenopathy BREAST:not examined LUNGS: clear to auscultation  HEART: regular rate & rhythm, no murmurs and no gallops ABDOMEN:abdomen soft and normal bowel sounds BACK: Back symmetric, no curvature. EXTREMITIES:less then 2 second capillary refill, no joint deformities, effusion, or inflammation, no skin discoloration, no cyanosis  NEURO: alert & oriented x 3 with fluent speech, no focal motor/sensory deficits, gait normal   LABORATORY DATA: CBC    Component Value Date/Time   WBC 7.7 04/15/2016 1335   RBC 3.85 (L) 04/15/2016 1335   HGB 11.1 (L) 04/15/2016 1335   HCT 35.8 (L) 04/15/2016 1335   PLT 264 04/15/2016 1335   MCV 93.0 04/15/2016 1335   MCH 28.8 04/15/2016 1335   MCHC 31.0 04/15/2016 1335   RDW 13.9 04/15/2016 1335   LYMPHSABS 1.5 04/15/2016 1335   MONOABS 0.7 04/15/2016 1335   EOSABS 0.4 04/15/2016 1335   BASOSABS 0.1 04/15/2016 1335      Chemistry      Component Value Date/Time   NA 138 04/15/2016 1335   K 3.7 04/15/2016 1335   CL 102 04/15/2016 1335   CO2 27 04/15/2016 1335   BUN 18 04/15/2016 1335   CREATININE 1.35 (H) 04/15/2016 1335      Component Value Date/Time   CALCIUM 8.9 04/15/2016 1335    ALKPHOS 96 04/15/2016 1335   AST 19 04/15/2016 1335   ALT 17 04/15/2016 1335   BILITOT 0.4 04/15/2016 1335        PENDING LABS:   RADIOGRAPHIC STUDIES:  No results found.   PATHOLOGY:    ASSESSMENT AND PLAN:  Lobular carcinoma of right breast (HCC) Right breast lobular carcinoma of upper outer quadrant, Stage I, ER/PR POSITIVE, HER2 NEGATIVE.  Treated definitively with lumpectomy on 09/20/2014 by Dr. Anthony Sar.  Now on Arimidex beginning on 09/27/2014.  She did not meet a Rad Oncologist at time of diagnosis and not offered XRT following surgery.  She was initially managed by Dr. Jacquiline Doe at [facility] until she transferred her medical oncology care on 01/15/2016.   Labs today: CBC diff, CMET, Vit D.  I  personally reviewed and went over laboratory results with the patient.  The results are noted within this dictation.  Mammogram is scheduled for 08/12/2016.  Labs in 3-4 months: CBC diff, CMET, Vit D.    She is having difficulty with bras fitting and not causing discomfort.  I have given her information regarding [facility] in [place] to see if they can offer her any solutions to this issue.  Return in 3-4 months for follow-up after mammogram.   ORDERS PLACED FOR THIS ENCOUNTER: Orders Placed This Encounter  Procedures  . CBC with Differential  . Comprehensive metabolic panel  . Vitamin D 25 hydroxy    MEDICATIONS PRESCRIBED THIS ENCOUNTER: Meds ordered this encounter  Medications  . amiodarone (PACERONE) 200 MG tablet    Sig: TAKE ONE TABLET BY MOUTH FOR 5 DAYS A WEEK. (MONDAY through [facility])  . Cranberry 400 MG CAPS    Sig: Take by mouth.  . lansoprazole (PREVACID) 30 MG capsule    Sig: Take 30 mg by mouth daily at 12 noon.  . sodium bicarbonate 650 MG tablet    Sig: Take 650 mg by mouth 4 (four) times daily.  . vitamin C (ASCORBIC ACID) 500 MG tablet    Sig: Take 500 mg by mouth daily.    THERAPY PLAN:  NCCN guidelines recommends the following surveillance  for invasive breast cancer (2.2017):  A. History and Physical exam 1-4 times per year as clinically appropriate for 5 years, then annually.  B. Periodic screening for changes in family history and referral to genetics counseling as indicated  C. Educate, monitor, and refer to lymphedema management.  D. Mammography every 12 months  E. Routine imaging of reconstructed breast is not indicated.  F. In the absence of clinical signs and symptoms suggestive of recurrent disease, there is no indication for laboratory or imaging studies for metastases screening.  G. Women on Tamoxifen: annual gynecologic assessment every 12 months if uterus is present.  H. Women on aromatase inhibitor or who experience ovarian failure secondary to treatment should have monitoring of bone health with a bone mineral density determination at baseline and periodically thereafter.  I. Assess and encourage adherence to adjuvant endocrine therapy.  J. Evidence suggests that active lifestyle, healthy diet, limited alcohol intake, and achieving and maintaining an ideal body weight (20-25 BMI) may lead to optimal breast cancer outcomes.   All questions were answered. The patient knows to call the clinic with any problems, questions or concerns. We can certainly see the patient much sooner if necessary.  Patient and plan discussed with Dr. Ancil Linsey and she is in agreement with the aforementioned.   This note is electronically signed by: Doy Mince 04/15/2016 4:02 PM

## 2016-04-16 LAB — VITAMIN D 25 HYDROXY (VIT D DEFICIENCY, FRACTURES): Vit D, 25-Hydroxy: 30 ng/mL (ref 30.0–100.0)

## 2016-05-30 DIAGNOSIS — R0602 Shortness of breath: Secondary | ICD-10-CM | POA: Diagnosis not present

## 2016-05-30 DIAGNOSIS — J40 Bronchitis, not specified as acute or chronic: Secondary | ICD-10-CM | POA: Diagnosis not present

## 2016-05-30 DIAGNOSIS — R079 Chest pain, unspecified: Secondary | ICD-10-CM | POA: Diagnosis not present

## 2016-06-03 DIAGNOSIS — I10 Essential (primary) hypertension: Secondary | ICD-10-CM | POA: Diagnosis not present

## 2016-06-03 DIAGNOSIS — Z45018 Encounter for adjustment and management of other part of cardiac pacemaker: Secondary | ICD-10-CM | POA: Diagnosis not present

## 2016-06-03 DIAGNOSIS — I480 Paroxysmal atrial fibrillation: Secondary | ICD-10-CM | POA: Diagnosis not present

## 2016-06-03 DIAGNOSIS — I442 Atrioventricular block, complete: Secondary | ICD-10-CM | POA: Diagnosis not present

## 2016-06-30 DIAGNOSIS — M25551 Pain in right hip: Secondary | ICD-10-CM | POA: Diagnosis not present

## 2016-06-30 DIAGNOSIS — K210 Gastro-esophageal reflux disease with esophagitis: Secondary | ICD-10-CM | POA: Diagnosis not present

## 2016-06-30 DIAGNOSIS — I10 Essential (primary) hypertension: Secondary | ICD-10-CM | POA: Diagnosis not present

## 2016-06-30 DIAGNOSIS — M1009 Idiopathic gout, multiple sites: Secondary | ICD-10-CM | POA: Diagnosis not present

## 2016-06-30 DIAGNOSIS — Z1389 Encounter for screening for other disorder: Secondary | ICD-10-CM | POA: Diagnosis not present

## 2016-06-30 DIAGNOSIS — Z0000 Encounter for general adult medical examination without abnormal findings: Secondary | ICD-10-CM | POA: Diagnosis not present

## 2016-06-30 DIAGNOSIS — N182 Chronic kidney disease, stage 2 (mild): Secondary | ICD-10-CM | POA: Diagnosis not present

## 2016-07-01 DIAGNOSIS — R102 Pelvic and perineal pain: Secondary | ICD-10-CM | POA: Diagnosis not present

## 2016-07-01 DIAGNOSIS — M160 Bilateral primary osteoarthritis of hip: Secondary | ICD-10-CM | POA: Diagnosis not present

## 2016-07-15 DIAGNOSIS — R079 Chest pain, unspecified: Secondary | ICD-10-CM | POA: Diagnosis not present

## 2016-07-15 DIAGNOSIS — R0602 Shortness of breath: Secondary | ICD-10-CM | POA: Diagnosis not present

## 2016-08-12 ENCOUNTER — Encounter (HOSPITAL_COMMUNITY): Payer: Medicare Other

## 2016-08-13 ENCOUNTER — Encounter: Payer: Self-pay | Admitting: Oncology

## 2016-08-13 DIAGNOSIS — R928 Other abnormal and inconclusive findings on diagnostic imaging of breast: Secondary | ICD-10-CM | POA: Diagnosis not present

## 2016-08-13 DIAGNOSIS — Z853 Personal history of malignant neoplasm of breast: Secondary | ICD-10-CM | POA: Diagnosis not present

## 2016-08-13 DIAGNOSIS — Z9889 Other specified postprocedural states: Secondary | ICD-10-CM | POA: Diagnosis not present

## 2016-08-18 ENCOUNTER — Ambulatory Visit (HOSPITAL_COMMUNITY): Payer: Medicare Other | Admitting: Adult Health

## 2016-08-18 ENCOUNTER — Other Ambulatory Visit (HOSPITAL_COMMUNITY): Payer: Medicare Other

## 2016-08-20 ENCOUNTER — Encounter (HOSPITAL_BASED_OUTPATIENT_CLINIC_OR_DEPARTMENT_OTHER): Payer: Medicare Other | Admitting: Adult Health

## 2016-08-20 ENCOUNTER — Encounter (HOSPITAL_COMMUNITY): Payer: Medicare Other | Attending: Oncology

## 2016-08-20 ENCOUNTER — Encounter (HOSPITAL_COMMUNITY): Payer: Self-pay | Admitting: Adult Health

## 2016-08-20 VITALS — BP 139/60 | HR 68 | Temp 98.2°F | Resp 16 | Ht 64.0 in | Wt 186.5 lb

## 2016-08-20 DIAGNOSIS — Z90710 Acquired absence of both cervix and uterus: Secondary | ICD-10-CM | POA: Insufficient documentation

## 2016-08-20 DIAGNOSIS — Z950 Presence of cardiac pacemaker: Secondary | ICD-10-CM | POA: Diagnosis not present

## 2016-08-20 DIAGNOSIS — C50411 Malignant neoplasm of upper-outer quadrant of right female breast: Secondary | ICD-10-CM | POA: Insufficient documentation

## 2016-08-20 DIAGNOSIS — Z96653 Presence of artificial knee joint, bilateral: Secondary | ICD-10-CM | POA: Insufficient documentation

## 2016-08-20 DIAGNOSIS — Z9049 Acquired absence of other specified parts of digestive tract: Secondary | ICD-10-CM | POA: Insufficient documentation

## 2016-08-20 DIAGNOSIS — Z79811 Long term (current) use of aromatase inhibitors: Secondary | ICD-10-CM

## 2016-08-20 DIAGNOSIS — C50911 Malignant neoplasm of unspecified site of right female breast: Secondary | ICD-10-CM

## 2016-08-20 DIAGNOSIS — N189 Chronic kidney disease, unspecified: Secondary | ICD-10-CM | POA: Diagnosis not present

## 2016-08-20 DIAGNOSIS — I110 Hypertensive heart disease with heart failure: Secondary | ICD-10-CM | POA: Insufficient documentation

## 2016-08-20 DIAGNOSIS — I509 Heart failure, unspecified: Secondary | ICD-10-CM | POA: Insufficient documentation

## 2016-08-20 DIAGNOSIS — Z170 Estrogen receptor positive status [ER+]: Secondary | ICD-10-CM | POA: Insufficient documentation

## 2016-08-20 DIAGNOSIS — E785 Hyperlipidemia, unspecified: Secondary | ICD-10-CM | POA: Diagnosis not present

## 2016-08-20 LAB — COMPREHENSIVE METABOLIC PANEL
ALT: 17 U/L (ref 14–54)
AST: 21 U/L (ref 15–41)
Albumin: 3.9 g/dL (ref 3.5–5.0)
Alkaline Phosphatase: 103 U/L (ref 38–126)
Anion gap: 10 (ref 5–15)
BUN: 18 mg/dL (ref 6–20)
CO2: 33 mmol/L — ABNORMAL HIGH (ref 22–32)
Calcium: 9.3 mg/dL (ref 8.9–10.3)
Chloride: 99 mmol/L — ABNORMAL LOW (ref 101–111)
Creatinine, Ser: 1.28 mg/dL — ABNORMAL HIGH (ref 0.44–1.00)
GFR calc Af Amer: 46 mL/min — ABNORMAL LOW (ref >60)
GFR calc non Af Amer: 40 mL/min — ABNORMAL LOW (ref >60)
Glucose, Bld: 92 mg/dL (ref 65–99)
Potassium: 3.8 mmol/L (ref 3.5–5.1)
Sodium: 142 mmol/L (ref 135–145)
Total Bilirubin: 0.6 mg/dL (ref 0.3–1.2)
Total Protein: 7.2 g/dL (ref 6.5–8.1)

## 2016-08-20 LAB — CBC WITH DIFFERENTIAL/PLATELET
Basophils Absolute: 0.1 10*3/uL (ref 0.0–0.1)
Basophils Relative: 1 %
Eosinophils Absolute: 0.4 10*3/uL (ref 0.0–0.7)
Eosinophils Relative: 6 %
HCT: 38.2 % (ref 36.0–46.0)
Hemoglobin: 12 g/dL (ref 12.0–15.0)
Lymphocytes Relative: 18 %
Lymphs Abs: 1.4 10*3/uL (ref 0.7–4.0)
MCH: 28.9 pg (ref 26.0–34.0)
MCHC: 31.4 g/dL (ref 30.0–36.0)
MCV: 92 fL (ref 78.0–100.0)
Monocytes Absolute: 0.7 10*3/uL (ref 0.1–1.0)
Monocytes Relative: 9 %
Neutro Abs: 5.1 10*3/uL (ref 1.7–7.7)
Neutrophils Relative %: 66 %
Platelets: 240 10*3/uL (ref 150–400)
RBC: 4.15 MIL/uL (ref 3.87–5.11)
RDW: 14.2 % (ref 11.5–15.5)
WBC: 7.6 10*3/uL (ref 4.0–10.5)

## 2016-08-20 NOTE — Patient Instructions (Addendum)
[place] at [facility] Discharge Instructions  RECOMMENDATIONS MADE BY THE CONSULTANT AND ANY TEST RESULTS WILL BE SENT TO YOUR REFERRING PHYSICIAN.  You were seen today by Mike Craze NP Follow up in 6 months with lab work For bra fitting you can go to -[facility] in [place] to [place] on [address] in [place] store   Thank you for choosing [place] at [facility] to provide your oncology and hematology care.  To afford each patient quality time with our provider, please arrive at least 15 minutes before your scheduled appointment time.    If you have a lab appointment with the [facility] please come in thru the  Main Entrance and check in at the main information desk  You need to re-schedule your appointment should you arrive 10 or more minutes late.  We strive to give you quality time with our providers, and arriving late affects you and other patients whose appointments are after yours.  Also, if you no show three or more times for appointments you may be dismissed from the clinic at the providers discretion.     Again, thank you for choosing [facility].  Our hope is that these requests will decrease the amount of time that you wait before being seen by our physicians.       _____________________________________________________________  Should you have questions after your visit to [facility], please contact our office at [phone] between the hours of 8:30 a.m. and 4:30 p.m.  Voicemails left after 4:30 p.m. will not be returned until the following business day.  For prescription refill requests, have your pharmacy contact our office.       Resources For Cancer Patients and their Caregivers ? American Cancer Society: Can assist with transportation, wigs, general needs, runs Look Good Feel Better.        781-716-9806 ? Cancer Care: Provides  financial assistance, online support groups, medication/co-pay assistance.  1-800-813-HOPE 646 364 4058) ? [place] Assists [place] cancer patients and their families through emotional , educational and financial support.  519-102-2845 ? [place] DSS Where to apply for food stamps, Medicaid and utility assistance. 940-510-8374 ? [facility]: Transportation to medical appointments. (209)570-6400 ? Social Security Administration: May apply for disability if have a Stage IV cancer. (256) 128-2726 864-091-8184 ? [employer], Disability and Transit Services: Assists with nutrition, care and transit needs. [place] Support Programs: _0 @ > Cancer Support Group  2nd Tuesday of the month 1pm-2pm, Journey Room  > Creative Journey  3rd Tuesday of the month 1130am-1pm, Journey Room  > Look Good Feel Better  1st Wednesday of the month 10am-12 noon, Journey Room (Call [facility] to register [phone])

## 2016-08-20 NOTE — Pre-Procedure Instructions (Signed)
Mammogram-[facility]-08/13/16

## 2016-08-20 NOTE — Progress Notes (Signed)
[place] [place], [place] [address]   CLINIC:  Medical Oncology/Hematology  PCP:  [name], [place] [phone]   REASON FOR VISIT:  Follow-up for Stage IA right breast invasive lobular carcinoma, ER+/PR+/HER2-  CURRENT THERAPY: Arimidex daily     BRIEF ONCOLOGIC HISTORY:    Lobular carcinoma of right breast ([place])   08/30/2014 Mammogram    Ill-defined shadowing mass at 10:00 position 6 cm from the nipple measuring [phone] cm in size without lymphadenopathy seen in the right axilla.      09/06/2014 Procedure    Biopsy of right breast mass at 10 o'clock position.      09/08/2014 Pathology Results    Invasive carcinoma, favor invasive ductal carcinoma.  Some features are suggestive of lobular carcinoma.  Negative e-cadherin immunostaining throughout the lesional cells supports the diagnosis of invasive lobular carcinoma.      09/08/2014 Pathology Results    ER 90%, PR 90%. HER2 FISH negative, Ki-67 26%.      09/20/2014 Definitive Surgery    Lumpectomy      09/20/2014 Pathology Results    Invasive lobular carcinoma, 0.6 cm in maximal dimension, closest margin is 0.3 cm at posterior resection margin. Grade 2. No LV I. 0/1 lymph nodes.      09/27/2014 -  Anti-estrogen oral therapy    Arimidex      08/08/2015 Mammogram    BIRADS 2      09/20/2015 Procedure    Right breast lumpectomy by Dr. Anthony Sar with sentinel lymph node biopsy.        INTERVAL HISTORY:  [name] 76 y.o. female presents for continued follow-up for Stage IA right breast cancer.   She continues on Anastrozole daily with great tolerance; denies any arthralgias, hot flashes, or vaginal dryness.  Denies any breast changes including lumps, skin changes/dimpling, or nipple discharge.  She performs regularly self breast exams.  Last mammogram was done last week at the [facility] in [place]. Her last bone density was done in 2017; she was  told it was normal.   She sees her cardiologist regularly with regards to her pacemaker. Her PCP is Dr. Sherrie Sport and she sees him regularly as well.  She is largely without complaints today.   Her appetite and energy levels are great; she describes them as being at 75%. She has had bilateral knee replacements, which limits her mobility at times "but I still get around real well and am able to do everything I need to do."    For fun, she enjoys spending time with her family.  She has 10 children (6 sons and 4 daughters), 10 grandchildren (ranging in ages from 53-18), and 5 great-grandchildren (ranging in ages from 71-8).  They all live near one another and she is able to see them all often.     REVIEW OF SYSTEMS:  Review of Systems  Constitutional: Negative.  Negative for chills, fatigue and fever.  HENT:  Negative.  Negative for lump/mass and nosebleeds.   Eyes: Negative.   Respiratory: Negative.  Negative for cough and shortness of breath.   Cardiovascular: Negative.  Negative for chest pain and leg swelling.  Gastrointestinal: Negative.  Negative for abdominal pain, blood in stool, constipation, diarrhea, nausea and vomiting.  Endocrine: Negative.   Genitourinary: Negative.  Negative for dysuria, hematuria and vaginal bleeding.   Musculoskeletal: Negative.  Negative for arthralgias.  Skin: Negative.  Negative for rash.  Neurological: Negative.  Negative for dizziness and headaches.  Hematological: Negative.  Negative for adenopathy. Does not bruise/bleed easily.  Psychiatric/Behavioral: Negative.  Negative for depression and sleep disturbance. The patient is not nervous/anxious.      PAST MEDICAL/SURGICAL HISTORY:  Past Medical History:  Diagnosis Date  . Arthritis   . Breast cancer ([place])   . Cellulitis    Right leg  . CHF (congestive heart failure) ([place])   . Hyperlipidemia   . Hypertension   . Lobular carcinoma of right breast ([place]) 09/27/2014  . Malignant neoplasm of upper-outer  quadrant of right female breast ([place]) 09/27/2014  . Varicose veins    venous stasis skin changes   Past Surgical History:  Procedure Laterality Date  . ABDOMINAL HYSTERECTOMY    . CHOLECYSTECTOMY    . FOOT SURGERY     Multiple foot surgeries by Dr. Blanch Media  . JOINT REPLACEMENT     bilateral knee by Drs. McGee and [place]  . SHOULDER SURGERY       SOCIAL HISTORY:  Social History   Social History  . Marital status: Divorced    Spouse name: N/A  . Number of children: N/A  . Years of education: N/A   Occupational History  . Not on file.   Social History Main Topics  . Smoking status: Never Smoker  . Smokeless tobacco: Never Used  . Alcohol use No  . Drug use: No  . Sexual activity: Not on file   Other Topics Concern  . Not on file   Social History Narrative  . No narrative on file    FAMILY HISTORY:  Family History  Problem Relation Age of Onset  . Hypertension Other   . Diabetes Mother     CURRENT MEDICATIONS:  Outpatient Encounter Prescriptions as of 08/20/2016  Medication Sig Note  . amiodarone (PACERONE) 200 MG tablet TAKE ONE TABLET BY MOUTH FOR 5 DAYS A WEEK. (MONDAY through [facility]) 04/15/2016: Received from: [facility]  . anastrozole (ARIMIDEX) 1 MG tablet TAKE ONE TABLET BY MOUTH DAILY   . benazepril (LOTENSIN) 40 MG tablet  01/15/2016: Received from: External Pharmacy  . citalopram (CELEXA) 20 MG tablet  01/15/2016: Received from: External Pharmacy  . Cranberry 400 MG CAPS Take by mouth.   . diazepam (VALIUM) 5 MG tablet  01/15/2016: Received from: External Pharmacy  . Docusate Calcium (STOOL SOFTENER PO) Take by mouth.   . doxazosin (CARDURA) 2 MG tablet  01/15/2016: Received from: External Pharmacy  . furosemide (LASIX) 40 MG tablet  01/15/2016: Received from: External Pharmacy  . gemfibrozil (LOPID) 600 MG tablet  01/15/2016: Received from: External Pharmacy  . lansoprazole (PREVACID) 30 MG capsule Take 30 mg by mouth daily at 12 noon.   . Multiple  Vitamin (MULTIVITAMIN) tablet Take 1 tablet by mouth daily.   . pantoprazole (PROTONIX) 20 MG tablet  01/15/2016: Received from: External Pharmacy  . sodium bicarbonate 650 MG tablet Take 650 mg by mouth 4 (four) times daily.   Marland Kitchen ULORIC 40 MG tablet  01/15/2016: Received from: External Pharmacy  . verapamil (CALAN-SR) 240 MG CR tablet  01/15/2016: Received from: External Pharmacy  . vitamin C (ASCORBIC ACID) 500 MG tablet Take 500 mg by mouth daily.    No facility-administered encounter medications on file as of 08/20/2016.     ALLERGIES:  Allergies  Allergen Reactions  . Dihydrocodeine Other (See Comments)  . Other     Blisters after wear compression stockings     PHYSICAL EXAM:  ECOG Performance status: 0 - Asymptomatic.   Vitals:   08/20/16 0957  BP: 139/60  Pulse: 68  Resp: 16  Temp: 98.2 F (36.8 C)   Filed Weights   08/20/16 0957  Weight: 186 lb 8 oz (84.6 kg)    Physical Exam  Constitutional: She is oriented to person, place, and time and well-developed, well-nourished, and in no distress.  HENT:  Head: Normocephalic.  Mouth/Throat: Oropharynx is clear and moist. No oropharyngeal exudate.  Eyes: Conjunctivae are normal. Pupils are equal, round, and reactive to light. No scleral icterus.  Neck: Normal range of motion. Neck supple.  Cardiovascular: Normal rate, regular rhythm and normal heart sounds.   Pulmonary/Chest: Effort normal and breath sounds normal. No respiratory distress.    Abdominal: Soft. Bowel sounds are normal. There is no tenderness.  Musculoskeletal: Normal range of motion. She exhibits no edema.  Decreased mobility in bilateral knees  Lymphadenopathy:    She has no cervical adenopathy.       Right: No supraclavicular adenopathy present.       Left: No supraclavicular adenopathy present.  Neurological: She is alert and oriented to person, place, and time. No cranial nerve deficit. Gait normal.  Skin: Skin is warm and dry. No rash noted.    Psychiatric: Mood, memory, affect and judgment normal.  Nursing note and vitals reviewed.    LABORATORY DATA:  I have reviewed the labs as listed.  CBC    Component Value Date/Time   WBC 7.6 08/20/2016 0900   RBC 4.15 08/20/2016 0900   HGB 12.0 08/20/2016 0900   HCT 38.2 08/20/2016 0900   PLT 240 08/20/2016 0900   MCV 92.0 08/20/2016 0900   MCH 28.9 08/20/2016 0900   MCHC 31.4 08/20/2016 0900   RDW 14.2 08/20/2016 0900   LYMPHSABS 1.4 08/20/2016 0900   MONOABS 0.7 08/20/2016 0900   EOSABS 0.4 08/20/2016 0900   BASOSABS 0.1 08/20/2016 0900   CMP Latest Ref Rng & Units 08/20/2016 04/15/2016  Glucose 65 - 99 mg/dL 92 92  BUN 6 - 20 mg/dL 18 18  Creatinine 0.44 - 1.00 mg/dL 1.28(H) 1.35(H)  Sodium 135 - 145 mmol/L 142 138  Potassium 3.5 - 5.1 mmol/L 3.8 3.7  Chloride 101 - 111 mmol/L 99(L) 102  CO2 22 - 32 mmol/L 33(H) 27  Calcium 8.9 - 10.3 mg/dL 9.3 8.9  Total Protein 6.5 - 8.1 g/dL 7.2 7.3  Total Bilirubin 0.3 - 1.2 mg/dL 0.6 0.4  Alkaline Phos 38 - 126 U/L 103 96  AST 15 - 41 U/L 21 19  ALT 14 - 54 U/L 17 17    PENDING LABS:    DIAGNOSTIC IMAGING:  Last mammogram: 08/13/16     PATHOLOGY:  (R) breast lumpectomy surgical path: 09/20/14        ASSESSMENT & PLAN:   Stage IA right breast invasive lobular carcinoma, ER+/PR+/HER2-: -Diagnosed in 09/2014; s/p right breast lumpectomy with Dr. Anthony Sar. She was not offered XRT following surgery. Started anti-estrogen therapy with Anastrozole following definitive lumpectomy in 09/2014. -Continue Anastrozole; planned duration of therapy 5-10 years.  -Last mamm[facility] 08/13/16 [facility] and was normal.  Continue annual mammography; next mammogram due in 08/2017.  -Clinical breast exam performed today and benign.  -Return to cancer center in 6 months for continued surveillance.    Bone health:  -Last DEXA scan done on 08/13/15 at [facility] showing osteopenia with  T-score -1.7. (copy of radiology report reviewed and sent to  [facility] to be scanned into patient chart).  -She understands that anastrozole can further weaken bone density; recommend continued calcium/vitamin D supplementation and weight-bearing exercises.   -Biennial DEXA scan will be due in 08/2017.   Chronic kidney disease: -Available labs reviewed with patient; creatinine continues to be elevated at 1.28 with decreased EGFR 40. Recommended she push oral fluid intake; maintain follow-up with PCP.     Dispo:  -Return to cancer center in 6 months for continued surveillance.    All questions were answered to patient's stated satisfaction. Encouraged patient to call with any new concerns or questions before her next visit to the cancer center and we can certain see her sooner, if needed.    Plan of care discussed with Dr. Talbert Cage, who agrees with the above aforementioned.    Orders placed this encounter:  No orders of the defined types were placed in this encounter.     Mike Craze, NP [place] [phone]

## 2016-08-21 ENCOUNTER — Other Ambulatory Visit (HOSPITAL_COMMUNITY): Payer: Self-pay | Admitting: Oncology

## 2016-08-21 DIAGNOSIS — R7989 Other specified abnormal findings of blood chemistry: Secondary | ICD-10-CM

## 2016-08-21 LAB — VITAMIN D 25 HYDROXY (VIT D DEFICIENCY, FRACTURES): Vit D, 25-Hydroxy: 26.2 ng/mL — ABNORMAL LOW (ref 30.0–100.0)

## 2016-08-21 MED ORDER — ERGOCALCIFEROL 1.25 MG (50000 UT) PO CAPS: 50000.0000 [IU] | ORAL_CAPSULE | ORAL | 0 refills | Status: DC

## 2016-09-11 DIAGNOSIS — I10 Essential (primary) hypertension: Secondary | ICD-10-CM | POA: Diagnosis not present

## 2016-09-25 DIAGNOSIS — Z79899 Other long term (current) drug therapy: Secondary | ICD-10-CM | POA: Diagnosis not present

## 2016-09-25 DIAGNOSIS — I4891 Unspecified atrial fibrillation: Secondary | ICD-10-CM | POA: Diagnosis not present

## 2016-10-14 DIAGNOSIS — Z0000 Encounter for general adult medical examination without abnormal findings: Secondary | ICD-10-CM | POA: Diagnosis not present

## 2016-10-29 ENCOUNTER — Other Ambulatory Visit (HOSPITAL_COMMUNITY): Payer: Self-pay | Admitting: Oncology

## 2016-10-29 DIAGNOSIS — C50919 Malignant neoplasm of unspecified site of unspecified female breast: Secondary | ICD-10-CM

## 2016-11-17 ENCOUNTER — Other Ambulatory Visit (HOSPITAL_COMMUNITY): Payer: Self-pay | Admitting: Oncology

## 2016-11-17 DIAGNOSIS — R7989 Other specified abnormal findings of blood chemistry: Secondary | ICD-10-CM

## 2016-11-18 ENCOUNTER — Other Ambulatory Visit (HOSPITAL_COMMUNITY): Payer: Self-pay | Admitting: *Deleted

## 2016-11-18 DIAGNOSIS — C50911 Malignant neoplasm of unspecified site of right female breast: Secondary | ICD-10-CM

## 2016-11-19 ENCOUNTER — Encounter (HOSPITAL_COMMUNITY): Payer: Self-pay | Admitting: Adult Health

## 2016-11-19 ENCOUNTER — Encounter (HOSPITAL_BASED_OUTPATIENT_CLINIC_OR_DEPARTMENT_OTHER): Payer: Medicare Other | Admitting: Adult Health

## 2016-11-19 ENCOUNTER — Encounter (HOSPITAL_COMMUNITY): Payer: Medicare Other | Attending: Oncology

## 2016-11-19 VITALS — BP 149/62 | HR 65 | Temp 98.1°F | Resp 18 | Ht 64.0 in | Wt 184.9 lb

## 2016-11-19 DIAGNOSIS — Z96653 Presence of artificial knee joint, bilateral: Secondary | ICD-10-CM | POA: Insufficient documentation

## 2016-11-19 DIAGNOSIS — Z170 Estrogen receptor positive status [ER+]: Secondary | ICD-10-CM | POA: Diagnosis not present

## 2016-11-19 DIAGNOSIS — Z888 Allergy status to other drugs, medicaments and biological substances status: Secondary | ICD-10-CM | POA: Insufficient documentation

## 2016-11-19 DIAGNOSIS — D649 Anemia, unspecified: Secondary | ICD-10-CM

## 2016-11-19 DIAGNOSIS — C50411 Malignant neoplasm of upper-outer quadrant of right female breast: Secondary | ICD-10-CM | POA: Insufficient documentation

## 2016-11-19 DIAGNOSIS — I509 Heart failure, unspecified: Secondary | ICD-10-CM | POA: Insufficient documentation

## 2016-11-19 DIAGNOSIS — I130 Hypertensive heart and chronic kidney disease with heart failure and stage 1 through stage 4 chronic kidney disease, or unspecified chronic kidney disease: Secondary | ICD-10-CM | POA: Diagnosis not present

## 2016-11-19 DIAGNOSIS — K118 Other diseases of salivary glands: Secondary | ICD-10-CM

## 2016-11-19 DIAGNOSIS — Z9049 Acquired absence of other specified parts of digestive tract: Secondary | ICD-10-CM | POA: Diagnosis not present

## 2016-11-19 DIAGNOSIS — Z79811 Long term (current) use of aromatase inhibitors: Secondary | ICD-10-CM | POA: Insufficient documentation

## 2016-11-19 DIAGNOSIS — C50911 Malignant neoplasm of unspecified site of right female breast: Secondary | ICD-10-CM

## 2016-11-19 DIAGNOSIS — Z90710 Acquired absence of both cervix and uterus: Secondary | ICD-10-CM | POA: Diagnosis not present

## 2016-11-19 DIAGNOSIS — M8580 Other specified disorders of bone density and structure, unspecified site: Secondary | ICD-10-CM | POA: Insufficient documentation

## 2016-11-19 DIAGNOSIS — Z79899 Other long term (current) drug therapy: Secondary | ICD-10-CM | POA: Insufficient documentation

## 2016-11-19 DIAGNOSIS — Z9889 Other specified postprocedural states: Secondary | ICD-10-CM | POA: Insufficient documentation

## 2016-11-19 DIAGNOSIS — E785 Hyperlipidemia, unspecified: Secondary | ICD-10-CM | POA: Insufficient documentation

## 2016-11-19 DIAGNOSIS — Z833 Family history of diabetes mellitus: Secondary | ICD-10-CM | POA: Insufficient documentation

## 2016-11-19 DIAGNOSIS — Z8249 Family history of ischemic heart disease and other diseases of the circulatory system: Secondary | ICD-10-CM | POA: Insufficient documentation

## 2016-11-19 DIAGNOSIS — N183 Chronic kidney disease, stage 3 (moderate): Secondary | ICD-10-CM | POA: Diagnosis not present

## 2016-11-19 LAB — COMPREHENSIVE METABOLIC PANEL
ALT: 16 U/L (ref 14–54)
AST: 19 U/L (ref 15–41)
Albumin: 3.9 g/dL (ref 3.5–5.0)
Alkaline Phosphatase: 81 U/L (ref 38–126)
Anion gap: 8 (ref 5–15)
BUN: 23 mg/dL — ABNORMAL HIGH (ref 6–20)
CO2: 29 mmol/L (ref 22–32)
Calcium: 9.1 mg/dL (ref 8.9–10.3)
Chloride: 105 mmol/L (ref 101–111)
Creatinine, Ser: 1.55 mg/dL — ABNORMAL HIGH (ref 0.44–1.00)
GFR calc Af Amer: 36 mL/min — ABNORMAL LOW (ref >60)
GFR calc non Af Amer: 31 mL/min — ABNORMAL LOW (ref >60)
Glucose, Bld: 101 mg/dL — ABNORMAL HIGH (ref 65–99)
Potassium: 4.3 mmol/L (ref 3.5–5.1)
Sodium: 142 mmol/L (ref 135–145)
Total Bilirubin: 0.5 mg/dL (ref 0.3–1.2)
Total Protein: 6.9 g/dL (ref 6.5–8.1)

## 2016-11-19 LAB — CBC WITH DIFFERENTIAL/PLATELET
Basophils Absolute: 0.1 10*3/uL (ref 0.0–0.1)
Basophils Relative: 1 %
Eosinophils Absolute: 0.4 10*3/uL (ref 0.0–0.7)
Eosinophils Relative: 7 %
HCT: 36 % (ref 36.0–46.0)
Hemoglobin: 11.4 g/dL — ABNORMAL LOW (ref 12.0–15.0)
Lymphocytes Relative: 19 %
Lymphs Abs: 1.1 10*3/uL (ref 0.7–4.0)
MCH: 29.2 pg (ref 26.0–34.0)
MCHC: 31.7 g/dL (ref 30.0–36.0)
MCV: 92.1 fL (ref 78.0–100.0)
Monocytes Absolute: 0.5 10*3/uL (ref 0.1–1.0)
Monocytes Relative: 9 %
Neutro Abs: 3.8 10*3/uL (ref 1.7–7.7)
Neutrophils Relative %: 64 %
Platelets: 237 10*3/uL (ref 150–400)
RBC: 3.91 MIL/uL (ref 3.87–5.11)
RDW: 14.2 % (ref 11.5–15.5)
WBC: 5.9 10*3/uL (ref 4.0–10.5)

## 2016-11-19 NOTE — Progress Notes (Signed)
[facility] [place], [place] [address]   CLINIC:  Medical Oncology/Hematology  PCP:  [name], MD [place] [phone]   REASON FOR VISIT:  Follow-up for Stage IA right breast invasive lobular carcinoma, ER+/PR+/HER2-  CURRENT THERAPY: Arimidex daily     BRIEF ONCOLOGIC HISTORY:    Lobular carcinoma of right breast ([place])   08/30/2014 Mammogram    Ill-defined shadowing mass at 10:00 position 6 cm from the nipple measuring [phone] cm in size without lymphadenopathy seen in the right axilla.      09/06/2014 Procedure    Biopsy of right breast mass at 10 o'clock position.      09/08/2014 Pathology Results    Invasive carcinoma, favor invasive ductal carcinoma.  Some features are suggestive of lobular carcinoma.  Negative e-cadherin immunostaining throughout the lesional cells supports the diagnosis of invasive lobular carcinoma.      09/08/2014 Pathology Results    ER 90%, PR 90%. HER2 FISH negative, Ki-67 26%.      09/20/2014 Definitive Surgery    Lumpectomy      09/20/2014 Pathology Results    Invasive lobular carcinoma, 0.6 cm in maximal dimension, closest margin is 0.3 cm at posterior resection margin. Grade 2. No LV I. 0/1 lymph nodes.      09/27/2014 -  Anti-estrogen oral therapy    Arimidex      08/08/2015 Mammogram    BIRADS 2      09/20/2015 Procedure    Right breast lumpectomy by Dr. Anthony Sar with sentinel lymph node biopsy.        INTERVAL HISTORY:  Ms. Mackiewicz 76 y.o. female presents for routine follow-up for Stage IA right breast cancer.   Overall, she tells me that she feels quite well. Appetite 75%; energy levels 50%.  Denies any sleep problems. Denies any headaches or dizziness.  Does report a recent fall on the sidewalk after it rained and she lost her footing; she fell on her right hip, but did seek medical care for an x-ray and reports she had no fracture or concerns on imaging.  Denies  any hip pain now.    Remains on Anastrozole with great tolerance. Denies any hot flashes, vaginal dryness, or worsening arthralgias. She has chronic bilat LE mobility issues after knee replacements, but otherwise no complaints of joint pain.  Denies any changes to her breasts or current breast concerns.    She sees her cardiologist regularly; it is a provider with [facility] in [place].  She has asked about transferring her care to a cardiologist in [place] "because my children have to take a day off of work to take me over there and I don't want them to miss work."  She has not been successful in having [facility] transfer her care to [place]. She is asking for my help in facilitating this.   has a new "lump" to her (L) submandibular area, which she noticed about 2 weeks ago.  Denies any skin redness or pain to the area.  Denies any recent URI, dental concerns, or (L) ear pain.  She is scheduled to see her PCP, Dr. Sherrie Sport regarding this concern on Monday.   Otherwise, she is largely without complaints today.   For fun, she enjoys spending time with her family.  She has 10 children (6 sons and 4 daughters), 10 grandchildren (ranging in ages from 19-18), and 5 great-grandchildren (ranging in ages from 26-8).  They all  live near one another and she is able to see them all often.    She is here today with her young granddaughter, Fanny Skates, who is a rising Glass blower/designer.     REVIEW OF SYSTEMS:  Review of Systems  Constitutional: Positive for fatigue.  HENT:   Positive for lump/mass.   Eyes: Negative.   Respiratory: Negative.  Negative for cough and shortness of breath.   Cardiovascular: Negative.  Negative for leg swelling.  Gastrointestinal: Negative.  Negative for abdominal pain, blood in stool, constipation, diarrhea, nausea and vomiting.  Endocrine: Negative.  Negative for hot flashes.  Genitourinary: Negative.  Negative for dysuria, hematuria and vaginal bleeding.   Musculoskeletal:        Decreased LE mobility d/t bilat knee replacements   Skin: Negative.  Negative for rash.  Neurological: Negative.  Negative for dizziness and headaches.  Hematological: Negative.   Psychiatric/Behavioral: Negative.  Negative for sleep disturbance.     PAST MEDICAL/SURGICAL HISTORY:  Past Medical History:  Diagnosis Date  . Arthritis   . Breast cancer ([place])   . Cellulitis    Right leg  . CHF (congestive heart failure) ([place])   . Hyperlipidemia   . Hypertension   . Lobular carcinoma of right breast ([place]) 09/27/2014  . Malignant neoplasm of upper-outer quadrant of right female breast ([place]) 09/27/2014  . Varicose veins    venous stasis skin changes   Past Surgical History:  Procedure Laterality Date  . ABDOMINAL HYSTERECTOMY    . CHOLECYSTECTOMY    . FOOT SURGERY     Multiple foot surgeries by Dr. Blanch Media  . JOINT REPLACEMENT     bilateral knee by Drs. McGee and Chelsea  . SHOULDER SURGERY       SOCIAL HISTORY:  Social History   Social History  . Marital status: Divorced    Spouse name: N/A  . Number of children: N/A  . Years of education: N/A   Occupational History  . Not on file.   Social History Main Topics  . Smoking status: Never Smoker  . Smokeless tobacco: Never Used  . Alcohol use No  . Drug use: No  . Sexual activity: Not on file   Other Topics Concern  . Not on file   Social History Narrative  . No narrative on file    FAMILY HISTORY:  Family History  Problem Relation Age of Onset  . Hypertension Other   . Diabetes Mother     CURRENT MEDICATIONS:  Outpatient Encounter Prescriptions as of 11/19/2016  Medication Sig Note  . amiodarone (PACERONE) 200 MG tablet TAKE ONE TABLET BY MOUTH FOR 5 DAYS A WEEK. (MONDAY through [facility]) 04/15/2016: Received from: [facility]  . anastrozole (ARIMIDEX) 1 MG tablet TAKE ONE TABLET BY MOUTH DAILY   . benazepril (LOTENSIN) 40 MG tablet  01/15/2016: Received from: External Pharmacy  . citalopram (CELEXA) 20  MG tablet  01/15/2016: Received from: External Pharmacy  . Cranberry 400 MG CAPS Take by mouth.   . diazepam (VALIUM) 5 MG tablet  01/15/2016: Received from: External Pharmacy  . Docusate Calcium (STOOL SOFTENER PO) Take by mouth.   . doxazosin (CARDURA) 2 MG tablet  01/15/2016: Received from: External Pharmacy  . ergocalciferol (VITAMIN D2) 50000 units capsule Take 1 capsule (50,000 Units total) by mouth once a week.   . furosemide (LASIX) 40 MG tablet  01/15/2016: Received from: External Pharmacy  . gemfibrozil (LOPID) 600 MG tablet  01/15/2016: Received from: External Pharmacy  . lansoprazole (PREVACID)  30 MG capsule Take 30 mg by mouth daily at 12 noon.   . Multiple Vitamin (MULTIVITAMIN) tablet Take 1 tablet by mouth daily.   . pantoprazole (PROTONIX) 20 MG tablet  01/15/2016: Received from: External Pharmacy  . sodium bicarbonate 650 MG tablet Take 650 mg by mouth 4 (four) times daily.   Marland Kitchen ULORIC 40 MG tablet  01/15/2016: Received from: External Pharmacy  . verapamil (CALAN-SR) 240 MG CR tablet  01/15/2016: Received from: External Pharmacy  . vitamin C (ASCORBIC ACID) 500 MG tablet Take 500 mg by mouth daily.    No facility-administered encounter medications on file as of 11/19/2016.     ALLERGIES:  Allergies  Allergen Reactions  . Dihydrocodeine Other (See Comments)  . Other     Blisters after wear compression stockings     PHYSICAL EXAM:  ECOG Performance status: 0 - Asymptomatic.   Vitals:   11/19/16 1106  BP: (!) 149/62  Pulse: 65  Resp: 18  Temp: 98.1 F (36.7 C)   Filed Weights   11/19/16 1106  Weight: 184 lb 14.4 oz (83.9 kg)    Physical Exam  Constitutional: She is oriented to person, place, and time and well-developed, well-nourished, and in no distress.  HENT:  Head: Normocephalic.  Mouth/Throat: Oropharynx is clear and moist. No oropharyngeal exudate.  Eyes: Pupils are equal, round, and reactive to light. Conjunctivae are normal. No scleral icterus.  Neck:  Normal range of motion. Neck supple.  Palpable, sub-cm lesion to (L) submandibular area. Appears to be more subQ than a true adenopathy. ? Cyst   Cardiovascular: Normal rate and regular rhythm.   Pulmonary/Chest: Effort normal and breath sounds normal. No respiratory distress. She has no wheezes. She has no rales.    Abdominal: Soft. Bowel sounds are normal. There is no tenderness.  Musculoskeletal: Normal range of motion. She exhibits no edema.  Lymphadenopathy:    She has no cervical adenopathy.       Right: No supraclavicular adenopathy present.       Left: No supraclavicular adenopathy present.  Neurological: She is alert and oriented to person, place, and time. No cranial nerve deficit. Gait normal.  Skin: Skin is warm and dry. No rash noted.  Psychiatric: Mood, memory, affect and judgment normal.  Nursing note and vitals reviewed.    LABORATORY DATA:  I have reviewed the labs as listed.  CBC    Component Value Date/Time   WBC 5.9 11/19/2016 1033   RBC 3.91 11/19/2016 1033   HGB 11.4 (L) 11/19/2016 1033   HCT 36.0 11/19/2016 1033   PLT 237 11/19/2016 1033   MCV 92.1 11/19/2016 1033   MCH 29.2 11/19/2016 1033   MCHC 31.7 11/19/2016 1033   RDW 14.2 11/19/2016 1033   LYMPHSABS 1.1 11/19/2016 1033   MONOABS 0.5 11/19/2016 1033   EOSABS 0.4 11/19/2016 1033   BASOSABS 0.1 11/19/2016 1033   CMP Latest Ref Rng & Units 11/19/2016 08/20/2016 04/15/2016  Glucose 65 - 99 mg/dL 101(H) 92 92  BUN 6 - 20 mg/dL 23(H) 18 18  Creatinine 0.44 - 1.00 mg/dL 1.55(H) 1.28(H) 1.35(H)  Sodium 135 - 145 mmol/L 142 142 138  Potassium 3.5 - 5.1 mmol/L 4.3 3.8 3.7  Chloride 101 - 111 mmol/L 105 99(L) 102  CO2 22 - 32 mmol/L 29 33(H) 27  Calcium 8.9 - 10.3 mg/dL 9.1 9.3 8.9  Total Protein 6.5 - 8.1 g/dL 6.9 7.2 7.3  Total Bilirubin 0.3 - 1.2 mg/dL 0.5 0.6 0.4  Alkaline  Phos 38 - 126 U/L 81 103 96  AST 15 - 41 U/L _0 ALT 14 - 54 U/L _1 PENDING LABS:    DIAGNOSTIC IMAGING:   Last mammogram: 08/13/16     PATHOLOGY:  (R) breast lumpectomy surgical path: 09/20/14             ASSESSMENT & PLAN:   Stage IA right breast invasive lobular carcinoma, ER+/PR+/HER2-: -Diagnosed in 09/2014; s/p right breast lumpectomy with Dr. Anthony Sar. She was not offered XRT following surgery. Started anti-estrogen therapy with Anastrozole following definitive lumpectomy in 09/2014. -Continue Anastrozole; she is tolerating very well with largely no side effects. Planned duration of therapy 5-10 years.  -Last mamm[facility] 08/13/16 [facility] and was normal; next mammogram due in 08/2017; will place orders at next follow-up visit.  -Clinical breast exam performed today and benign.  -Return to cancer center in 6 months for continued surveillance with labs.     Bone health:  -Last DEXA scan done on 08/13/15 at [facility]) showing osteopenia with T-score -1.7. -She is waiting on her shipment of calcium and vitamin D supplementation to come to her home; encouraged her to start when able. Continue weight-bearing exercises as well.  -Biennial DEXA scan due in 08/2017; will order at next follow-up visit (she prefers to have imaging done in [place]).  -She understands that anastrozole can further weaken bone density; recommend continued calcium/vitamin D supplementation and weight-bearing exercises.   -Biennial DEXA scan will be due in 08/2017.   (L) submandibular lesion:  -Very small, sub-cm palpable mass in area of patient concern.  It feels more subcutaneous and less firm than true adenopathy.  Could consider ultrasound of this area for further evaluation.  However, she has a visit scheduled with her PCP on Monday, 11/24/16 regarding this concern and I will defer to their recommendations for evaluation and management.  I have low clinical suspicion of malignancy. Differential includes a cyst or other benign dermatologic condition.  Defer to PCP for now.   Chronic kidney disease/Mild anemia: -CKD remains; currently stage stage 3 with EGFR 31 today. BUN/Cre elevated at 23/1.55.   -She does not have a nephrologist.  Her very mild anemia, with Hgb 11.4 g/dL could be secondary to CKD. We discussed this briefly today.  Will continue to monitor her anemia with lab work going forward.  Encouraged her to talk to her PCP about referral to nephrologist if they felt it was appropriate.   -Could consider collecting EPO level in the future if her anemia worsens (Hgb <11).   Cardiac care:  -Patient prefers to have her cardiology follow-up visits and management in [place].  I will try to help facilitate that transfer of care per her wishes. Referral to cardiology here at [facility] placed today for her to establish care in [place].       Dispo:  -Return to cancer center in 6 months for follow-up with labs.    All questions were answered to patient's stated satisfaction. Encouraged patient to call with any new concerns or questions before her next visit to the cancer center and we can certain see her sooner, if needed.    Plan of care discussed with Dr. Talbert Cage, who agrees with the above aforementioned.    Orders placed this encounter:  Orders Placed This Encounter  Procedures  . CBC with Differential/Platelet  . Comprehensive metabolic panel      Mike Craze,  NP [place] [phone]

## 2016-11-19 NOTE — Patient Instructions (Signed)
[place] at [facility] Discharge Instructions  RECOMMENDATIONS MADE BY THE CONSULTANT AND ANY TEST RESULTS WILL BE SENT TO YOUR REFERRING PHYSICIAN.  You were seen today by Mike Craze NP. Return in 6 months for labs and follow up.  Thank you for choosing [place] at [facility] to provide your oncology and hematology care.  To afford each patient quality time with our provider, please arrive at least 15 minutes before your scheduled appointment time.    If you have a lab appointment with the [facility] please come in thru the  Main Entrance and check in at the main information desk  You need to re-schedule your appointment should you arrive 10 or more minutes late.  We strive to give you quality time with our providers, and arriving late affects you and other patients whose appointments are after yours.  Also, if you no show three or more times for appointments you may be dismissed from the clinic at the providers discretion.     Again, thank you for choosing [facility].  Our hope is that these requests will decrease the amount of time that you wait before being seen by our physicians.       _____________________________________________________________  Should you have questions after your visit to [facility], please contact our office at [phone] between the hours of 8:30 a.m. and 4:30 p.m.  Voicemails left after 4:30 p.m. will not be returned until the following business day.  For prescription refill requests, have your pharmacy contact our office.       Resources For Cancer Patients and their Caregivers ? American Cancer Society: Can assist with transportation, wigs, general needs, runs Look Good Feel Better.        564-135-5591 ? Cancer Care: Provides financial assistance, online support groups, medication/co-pay assistance.  1-800-813-HOPE 715-404-3677) ? [facility] Assists [place] cancer patients and their families through emotional , educational and financial support.  724-706-1792 ? [place] DSS Where to apply for food stamps, Medicaid and utility assistance. 228-821-3329 ? [employer]: Transportation to medical appointments. 470 756 8991 ? Social Security Administration: May apply for disability if have a Stage IV cancer. 213 740 4320 410-578-9720 ? [employer], Disability and Transit Services: Assists with nutrition, care and transit needs. [place] Support Programs: _0 @ > Cancer Support Group  2nd Tuesday of the month 1pm-2pm, Journey Room  > Creative Journey  3rd Tuesday of the month 1130am-1pm, Journey Room  > Look Good Feel Better  1st Wednesday of the month 10am-12 noon, Journey Room (Call [facility] to register [phone])

## 2016-11-20 LAB — VITAMIN D 25 HYDROXY (VIT D DEFICIENCY, FRACTURES): Vit D, 25-Hydroxy: 25.8 ng/mL — ABNORMAL LOW (ref 30.0–100.0)

## 2016-11-24 ENCOUNTER — Other Ambulatory Visit (HOSPITAL_COMMUNITY): Payer: Self-pay | Admitting: *Deleted

## 2016-11-24 DIAGNOSIS — R7989 Other specified abnormal findings of blood chemistry: Secondary | ICD-10-CM

## 2016-11-24 DIAGNOSIS — L723 Sebaceous cyst: Secondary | ICD-10-CM | POA: Diagnosis not present

## 2016-11-24 MED ORDER — ERGOCALCIFEROL 1.25 MG (50000 UT) PO CAPS: 50000.0000 [IU] | ORAL_CAPSULE | ORAL | 1 refills | Status: DC

## 2016-12-08 ENCOUNTER — Ambulatory Visit (INDEPENDENT_AMBULATORY_CARE_PROVIDER_SITE_OTHER): Payer: Medicare Other | Admitting: Cardiovascular Disease

## 2016-12-08 ENCOUNTER — Encounter: Payer: Self-pay | Admitting: Cardiovascular Disease

## 2016-12-08 VITALS — BP 156/84 | HR 63 | Ht 63.0 in | Wt 185.0 lb

## 2016-12-08 DIAGNOSIS — I480 Paroxysmal atrial fibrillation: Secondary | ICD-10-CM | POA: Diagnosis not present

## 2016-12-08 DIAGNOSIS — I10 Essential (primary) hypertension: Secondary | ICD-10-CM | POA: Diagnosis not present

## 2016-12-08 DIAGNOSIS — Z7189 Other specified counseling: Secondary | ICD-10-CM

## 2016-12-08 DIAGNOSIS — Z950 Presence of cardiac pacemaker: Secondary | ICD-10-CM

## 2016-12-08 MED ORDER — RIVAROXABAN 20 MG PO TABS: 20.0000 mg | ORAL_TABLET | Freq: Every day | ORAL | 0 refills | Status: DC

## 2016-12-08 MED ORDER — RIVAROXABAN 20 MG PO TABS: 20.0000 mg | ORAL_TABLET | Freq: Every day | ORAL | 6 refills | Status: DC

## 2016-12-08 NOTE — Addendum Note (Signed)
Addended by: Laurine Blazer on: 12/08/2016 10:51 AM   Modules accepted: Orders

## 2016-12-08 NOTE — Progress Notes (Addendum)
CARDIOLOGY CONSULT NOTE  Patient ID: Alicia Nash MRN: [id number] DOB/AGE: [birth year] 77 y.o.  Admit date: (Not on file) Primary Physician: [name], MD Referring Physician: Mike Craze NP  Reason for Consultation: atrial fibrillation, pacemaker  HPI: Alicia Nash is a 76 y.o. female who is being seen today for the evaluation of Paroxysmal atrial fibrillation and pacemaker at the request of Holley Bouche, NP.   Pacemaker was apparently implanted for complete heart block on 05/12/13.  She is on low-dose amiodarone due to abnormal PFTs. Anticoagulation was reportedly discussed with her by a prior cardiologist, but she has no recollection of this conversation.  I personally reviewed PFTs performed on 09/25/16 which showed reduced FVC with mild reduction in diffusion capacity and no evidence of obstruction.  Device function in January 2018 was normal.  Coronary angiography on 05/12/13 was normal.  Echocardiogram in June 2015 demonstrated normal left ventricular systolic function, LVEF 67-34%, with mild aortic sclerosis with no evidence of stenosis.  She denies chest pain. She has occasional shortness of breath. She is not certain why she was started on Lasix by her PCP.  She has stage IA right breast invasive lobular carcinoma.  Labs 11/19/16: BUN 23, creatinine 1.55, hemoglobin 11.4, platelets 237.  ECG performed in the office today which I ordered and personally interpreted demonstrated a ventricular paced rhythm.  Allergies  Allergen Reactions  . Dihydrocodeine Other (See Comments)  . Other     Blisters after wear compression stockings    Current Outpatient Prescriptions  Medication Sig Dispense Refill  . amiodarone (PACERONE) 200 MG tablet TAKE ONE TABLET BY MOUTH FOR 5 DAYS A WEEK. (MONDAY through [facility])    . anastrozole (ARIMIDEX) 1 MG tablet TAKE ONE TABLET BY MOUTH DAILY 30 tablet 5  . benazepril (LOTENSIN) 40 MG tablet     . citalopram  (CELEXA) 20 MG tablet     . Cranberry 400 MG CAPS Take by mouth.    . diazepam (VALIUM) 5 MG tablet     . Docusate Calcium (STOOL SOFTENER PO) Take by mouth.    . doxazosin (CARDURA) 2 MG tablet     . ergocalciferol (VITAMIN D2) 50000 units capsule Take 1 capsule (50,000 Units total) by mouth once a week. 12 capsule 1  . furosemide (LASIX) 40 MG tablet     . gemfibrozil (LOPID) 600 MG tablet     . lansoprazole (PREVACID) 30 MG capsule Take 30 mg by mouth daily at 12 noon.    . Multiple Vitamin (MULTIVITAMIN) tablet Take 1 tablet by mouth daily.    . pantoprazole (PROTONIX) 20 MG tablet     . sodium bicarbonate 650 MG tablet Take 650 mg by mouth 4 (four) times daily.    Marland Kitchen ULORIC 40 MG tablet     . verapamil (CALAN-SR) 240 MG CR tablet     . vitamin C (ASCORBIC ACID) 500 MG tablet Take 500 mg by mouth daily.     No current facility-administered medications for this visit.     Past Medical History:  Diagnosis Date  . Arthritis   . Breast cancer ([place])   . Cellulitis    Right leg  . CHF (congestive heart failure) ([place])   . Hyperlipidemia   . Hypertension   . Lobular carcinoma of right breast ([place]) 09/27/2014  . Malignant neoplasm of upper-outer quadrant of right female breast ([place]) 09/27/2014  . Varicose veins    venous stasis skin changes  Past Surgical History:  Procedure Laterality Date  . ABDOMINAL HYSTERECTOMY    . CHOLECYSTECTOMY    . FOOT SURGERY     Multiple foot surgeries by Dr. Blanch Media  . JOINT REPLACEMENT     bilateral knee by Drs. McGee and Taylors  . SHOULDER SURGERY      Social History   Social History  . Marital status: Divorced    Spouse name: N/A  . Number of children: N/A  . Years of education: N/A   Occupational History  . Not on file.   Social History Main Topics  . Smoking status: Never Smoker  . Smokeless tobacco: Never Used  . Alcohol use No  . Drug use: No  . Sexual activity: Not on file   Other Topics Concern  . Not on file   Social  History Narrative  . No narrative on file     No family history of premature CAD in 1st degree relatives.  Current Meds  Medication Sig  . amiodarone (PACERONE) 200 MG tablet TAKE ONE TABLET BY MOUTH FOR 5 DAYS A WEEK. (MONDAY through [facility])  . anastrozole (ARIMIDEX) 1 MG tablet TAKE ONE TABLET BY MOUTH DAILY  . benazepril (LOTENSIN) 40 MG tablet   . citalopram (CELEXA) 20 MG tablet   . Cranberry 400 MG CAPS Take by mouth.  . diazepam (VALIUM) 5 MG tablet   . Docusate Calcium (STOOL SOFTENER PO) Take by mouth.  . doxazosin (CARDURA) 2 MG tablet   . ergocalciferol (VITAMIN D2) 50000 units capsule Take 1 capsule (50,000 Units total) by mouth once a week.  . furosemide (LASIX) 40 MG tablet   . gemfibrozil (LOPID) 600 MG tablet   . lansoprazole (PREVACID) 30 MG capsule Take 30 mg by mouth daily at 12 noon.  . Multiple Vitamin (MULTIVITAMIN) tablet Take 1 tablet by mouth daily.  . pantoprazole (PROTONIX) 20 MG tablet   . sodium bicarbonate 650 MG tablet Take 650 mg by mouth 4 (four) times daily.  Marland Kitchen ULORIC 40 MG tablet   . verapamil (CALAN-SR) 240 MG CR tablet   . vitamin C (ASCORBIC ACID) 500 MG tablet Take 500 mg by mouth daily.      Review of systems complete and found to be negative unless listed above in HPI    Physical exam Blood pressure (!) 156/84, pulse 63, height _0  (1.6 m), weight 185 lb (83.9 kg), SpO2 94 %. General: NAD Neck: No JVD, no thyromegaly or thyroid nodule.  Lungs: Clear to auscultation bilaterally with normal respiratory effort. CV: Nondisplaced PMI. Regular rate and rhythm, normal S1/S2, no S3/S4, no murmur.  No peripheral edema.  No carotid bruit.    Abdomen: Soft, nontender, no distention.  Skin: Intact without lesions or rashes.  Neurologic: Alert and oriented x 3.  Psych: Normal affect. Extremities: No clubbing or cyanosis.  HEENT: Normal.   ECG: Most recent ECG reviewed.   Labs: Lab Results  Component Value Date/Time   K 4.3 11/19/2016  10:33 AM   BUN 23 (H) 11/19/2016 10:33 AM   CREATININE 1.55 (H) 11/19/2016 10:33 AM   ALT 16 11/19/2016 10:33 AM   HGB 11.4 (L) 11/19/2016 10:33 AM     Lipids: No results found for: LDLCALC, LDLDIRECT, CHOL, TRIG, HDL      ASSESSMENT AND PLAN:  1. Paroxysmal atrial fibrillation: She is currently on amiodarone 200 mg every other day and Harrison-acting verapamil 240 mg. CHADSVASC score is 4. I had a Pair discussion with her about  anticoagulation for thromboembolic risk reduction. I will start Xarelto 20 mg daily.  2. Cardiac pacemaker for congenital heart block: I will enroll in device clinic for routine surveillance.  3. Hypertension: Elevated. I will monitor.   Disposition: Follow up in 3 months  Signed: Kate Sable, M.D., F.A.C.C.  12/08/2016, 10:12 AM

## 2016-12-08 NOTE — Patient Instructions (Signed)
Medication Instructions:   Begin Xarelto 17m daily.   Continue all other medications.    Labwork: none  Testing/Procedures: none  Follow-Up: 3 months   Any Other Special Instructions Will Be Listed Below (If Applicable).  Enroll in device clinic.    1 month follow up with anticoagulation nurse for Xarelto management.  If you need a refill on your cardiac medications before your next appointment, please call your pharmacy.

## 2016-12-09 NOTE — Addendum Note (Signed)
Addended by: Laurine Blazer on: 12/09/2016 08:19 AM   Modules accepted: Orders

## 2016-12-12 ENCOUNTER — Encounter: Payer: Self-pay | Admitting: Internal Medicine

## 2016-12-12 ENCOUNTER — Ambulatory Visit (INDEPENDENT_AMBULATORY_CARE_PROVIDER_SITE_OTHER): Payer: Medicare Other | Admitting: Internal Medicine

## 2016-12-12 VITALS — BP 128/68 | HR 59 | Ht 63.0 in | Wt 185.0 lb

## 2016-12-12 DIAGNOSIS — I480 Paroxysmal atrial fibrillation: Secondary | ICD-10-CM | POA: Diagnosis not present

## 2016-12-12 DIAGNOSIS — Z950 Presence of cardiac pacemaker: Secondary | ICD-10-CM | POA: Diagnosis not present

## 2016-12-12 DIAGNOSIS — I495 Sick sinus syndrome: Secondary | ICD-10-CM

## 2016-12-12 DIAGNOSIS — I442 Atrioventricular block, complete: Secondary | ICD-10-CM

## 2016-12-12 LAB — CUP PACEART INCLINIC DEVICE CHECK
Battery Impedance: 399 Ohm
Battery Remaining Longevity: 90 mo
Battery Voltage: 2.79 V
Brady Statistic AP VP Percent: 34 %
Brady Statistic AP VS Percent: 0 %
Brady Statistic AS VP Percent: 66 %
Brady Statistic AS VS Percent: 0 %
Date Time Interrogation Session: 20180810144243
Implantable Lead Implant Date: 20150108
Implantable Lead Implant Date: 20150108
Implantable Lead Location: 753859
Implantable Lead Location: 753860
Implantable Lead Model: 5076
Implantable Lead Model: 5076
Implantable Pulse Generator Implant Date: 20150108
Lead Channel Impedance Value: 399 Ohm
Lead Channel Impedance Value: 411 Ohm
Lead Channel Pacing Threshold Amplitude: 0.625 V
Lead Channel Pacing Threshold Amplitude: 0.75 V
Lead Channel Pacing Threshold Amplitude: 1 V
Lead Channel Pacing Threshold Amplitude: 1 V
Lead Channel Pacing Threshold Pulse Width: 0.4 ms
Lead Channel Pacing Threshold Pulse Width: 0.4 ms
Lead Channel Pacing Threshold Pulse Width: 0.4 ms
Lead Channel Pacing Threshold Pulse Width: 0.4 ms
Lead Channel Sensing Intrinsic Amplitude: 0.5 mV
Lead Channel Setting Pacing Amplitude: 2 V
Lead Channel Setting Pacing Amplitude: 2.5 V
Lead Channel Setting Pacing Pulse Width: 0.4 ms
Lead Channel Setting Sensing Sensitivity: 2 mV

## 2016-12-12 NOTE — Progress Notes (Signed)
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 female with a h/o sinus bradycardia and AV block sp PPM (MDT) by Dr Dot Lanes at [facility] who presents today to establish care in the Electrophysiology device clinic.   The patient reports doing very well since having a pacemaker implanted and remains very active despite her age.   Today, she  denies symptoms of palpitations, chest pain, shortness of breath, orthopnea, PND, lower extremity edema, dizziness, presyncope, syncope, or neurologic sequela.  The patientis tolerating medications without difficulties and is otherwise without complaint today.   Past Medical History:  Diagnosis Date  . Arthritis   . Breast cancer ([place])   . Cellulitis    Right leg  . CHF (congestive heart failure) ([place])   . Hyperlipidemia   . Hypertension   . Lobular carcinoma of right breast ([place]) 09/27/2014  . Malignant neoplasm of upper-outer quadrant of right female breast ([place]) 09/27/2014  . Paroxysmal atrial fibrillation (H[facility])   . Varicose veins    venous stasis skin changes   Past Surgical History:  Procedure Laterality Date  . ABDOMINAL HYSTERECTOMY    . CHOLECYSTECTOMY    . FOOT SURGERY     Multiple foot surgeries by Dr. Blanch Media  . JOINT REPLACEMENT     bilateral knee by Drs. McGee and [place]  . PACEMAKER IMPLANT Left 05/12/2013   MDT Sherril Croon PPM implanted by Dr Dot Lanes for CHB and SSS  . SHOULDER SURGERY      Social History   Social History  . Marital status: Divorced    Spouse name: N/A  . Number of children: N/A  . Years of education: N/A   Occupational History  . Not on file.   Social History Main Topics  . Smoking status: Never Smoker  . Smokeless tobacco: Never Used  . Alcohol use No  . Drug use: No  . Sexual activity: Not on file   Other Topics Concern  . Not on file   Social History Narrative   Lives in [place]   Divorced   Retired from [employer]    Family History    Problem Relation Age of Onset  . Hypertension Other   . Diabetes Mother     Allergies  Allergen Reactions  . Dihydrocodeine Other (See Comments)  . Other     Blisters after wear compression stockings    Current Outpatient Prescriptions  Medication Sig Dispense Refill  . amiodarone (PACERONE) 200 MG tablet TAKE ONE TABLET BY MOUTH FOR 5 DAYS A WEEK. (MONDAY through [facility])    . anastrozole (ARIMIDEX) 1 MG tablet TAKE ONE TABLET BY MOUTH DAILY 30 tablet 5  . benazepril (LOTENSIN) 40 MG tablet     . citalopram (CELEXA) 20 MG tablet     . Cranberry 400 MG CAPS Take by mouth.    . diazepam (VALIUM) 5 MG tablet     . Docusate Calcium (STOOL SOFTENER PO) Take by mouth.    . doxazosin (CARDURA) 2 MG tablet     . ergocalciferol (VITAMIN D2) 50000 units capsule Take 1 capsule (50,000 Units total) by mouth once a week. 12 capsule 1  . furosemide (LASIX) 40 MG tablet     . gemfibrozil (LOPID) 600 MG tablet     . lansoprazole (PREVACID) 30 MG capsule Take 30 mg by mouth daily at 12 noon.    . Multiple Vitamin (MULTIVITAMIN) tablet Take 1 tablet by mouth daily.    Marland Kitchen  pantoprazole (PROTONIX) 20 MG tablet     . rivaroxaban (XARELTO) 20 MG TABS tablet Take 1 tablet (20 mg total) by mouth daily with supper. 14 tablet 0  . sodium bicarbonate 650 MG tablet Take 650 mg by mouth 4 (four) times daily.    Marland Kitchen ULORIC 40 MG tablet     . verapamil (CALAN-SR) 240 MG CR tablet     . vitamin C (ASCORBIC ACID) 500 MG tablet Take 500 mg by mouth daily.     No current facility-administered medications for this visit.     ROS- all systems are reviewed and negative except as per HPI  Physical Exam: Vitals:   12/12/16 1328  BP: 128/68  Pulse: (!) 59  SpO2: 94%  Weight: 185 lb (83.9 kg)  Height: _0  (1.6 m)    GEN- The patient is well appearing, alert and oriented x 3 today.   Head- normocephalic, atraumatic Eyes-  Sclera clear, conjunctiva pink Ears- hearing intact Oropharynx- clear Neck- supple,    Lungs- Clear to ausculation bilaterally, normal work of breathing Chest- pacemaker pocket is well healed Heart- Regular rate and rhythm, no murmurs, rubs or gallops, PMI not laterally displaced GI- soft, NT, ND, + BS Extremities- no clubbing, cyanosis, or edema MS- no significant deformity or atrophy Skin- no rash or lesion Psych- euthymic mood, full affect Neuro- strength and sensation are intact  Pacemaker interrogation- reviewed in detail today,  See PACEART report  Assessment and Plan:  1. Sick sinus syndrome and complete heart block Dependant today Normal pacemaker function See Pace Art report No changes today  2. Paroxysmal atrial fibrillation No afib on PPM since last interrogation 1/18  Consider reducing amiodarone to 126m daily.  May be able to eventually wean off of amiodarone chads2vasc score is 5.  On xarelto  3. HTN Stable No change required today  carelink Return in a year  JThompson GrayerMD, FAllegheny Valley Hospital8/02/2017 2:08 PM

## 2016-12-12 NOTE — Patient Instructions (Signed)
Medication Instructions:  Continue all current medications.  Labwork: none  Testing/Procedures: none  Follow-Up: Your physician wants you to follow up in:  1 year.  You will receive a reminder letter in the mail one-two months in advance.  If you don't receive a letter, please call our office to schedule the follow up appointment   Any Other Special Instructions Will Be Listed Below (If Applicable). Remote monitoring is used to monitor your Pacemaker of ICD from home. This monitoring reduces the number of office visits required to check your device to one time per year. It allows [place] to keep an eye on the functioning of your device to ensure it is working properly. You are scheduled for a device check from home on 03-16-2017.  You may send your transmission at any time that day. If you have a wireless device, the transmission will be sent automatically. After your physician reviews your transmission, you will receive a postcard with your next transmission date.  If you need a refill on your cardiac medications before your next appointment, please call your pharmacy.

## 2016-12-15 DIAGNOSIS — I10 Essential (primary) hypertension: Secondary | ICD-10-CM | POA: Diagnosis not present

## 2016-12-17 DIAGNOSIS — N182 Chronic kidney disease, stage 2 (mild): Secondary | ICD-10-CM | POA: Diagnosis not present

## 2016-12-17 DIAGNOSIS — I10 Essential (primary) hypertension: Secondary | ICD-10-CM | POA: Diagnosis not present

## 2016-12-19 DIAGNOSIS — J449 Chronic obstructive pulmonary disease, unspecified: Secondary | ICD-10-CM | POA: Diagnosis not present

## 2016-12-19 DIAGNOSIS — R0602 Shortness of breath: Secondary | ICD-10-CM | POA: Diagnosis not present

## 2016-12-24 ENCOUNTER — Other Ambulatory Visit: Payer: Self-pay

## 2016-12-24 MED ORDER — RIVAROXABAN 20 MG PO TABS: 20.0000 mg | ORAL_TABLET | Freq: Every day | ORAL | 3 refills | Status: DC

## 2016-12-30 ENCOUNTER — Other Ambulatory Visit: Payer: Self-pay | Admitting: *Deleted

## 2016-12-30 MED ORDER — RIVAROXABAN 20 MG PO TABS: 20.0000 mg | ORAL_TABLET | Freq: Every day | ORAL | 3 refills | Status: DC

## 2017-01-08 ENCOUNTER — Ambulatory Visit (INDEPENDENT_AMBULATORY_CARE_PROVIDER_SITE_OTHER): Payer: Medicare Other | Admitting: *Deleted

## 2017-01-08 DIAGNOSIS — Z5181 Encounter for therapeutic drug level monitoring: Secondary | ICD-10-CM | POA: Diagnosis not present

## 2017-01-08 DIAGNOSIS — I480 Paroxysmal atrial fibrillation: Secondary | ICD-10-CM | POA: Diagnosis not present

## 2017-01-08 NOTE — Progress Notes (Signed)
Pt was started on Xarelto 37m daily for PAF on 12/08/16 by Dr KBronson Ing.    Reviewed patients medication list.  Pt is not currently on any combined P-gp and strong CYP3A4 inhibitors/inducers (ketoconazole, traconazole, ritonavir, carbamazepine, phenytoin, rifampin, St. John's wort).  Reviewed labs.  SCr 1.5, Weight 83.5, CrCl 41.40.  Dose is appropriate based on CrCl.   Hgb and HCT:  11.4/36.0  A full discussion of the nature of anticoagulants has been carried out.  A benefit/risk analysis has been presented to the patient, so that they understand the justification for choosing anticoagulation with Xarelto at this time.  The need for compliance is stressed.  Pt is aware to take the medication once daily with the largest meal of the day.  Side effects of potential bleeding are discussed, including unusual colored urine or stools, coughing up blood or coffee ground emesis, nose bleeds or serious fall or head trauma.  Discussed signs and symptoms of stroke. The patient should avoid any OTC items containing aspirin or ibuprofen.  Avoid alcohol consumption.   Call if any signs of abnormal bleeding.  Discussed financial obligations and resolved any difficulty in obtaining medication.  Next lab test in 6 months.   Appt made.

## 2017-02-05 DIAGNOSIS — J301 Allergic rhinitis due to pollen: Secondary | ICD-10-CM | POA: Diagnosis not present

## 2017-02-05 DIAGNOSIS — J208 Acute bronchitis due to other specified organisms: Secondary | ICD-10-CM | POA: Diagnosis not present

## 2017-02-06 ENCOUNTER — Other Ambulatory Visit (HOSPITAL_COMMUNITY): Payer: Self-pay | Admitting: Adult Health

## 2017-02-06 DIAGNOSIS — R7989 Other specified abnormal findings of blood chemistry: Secondary | ICD-10-CM

## 2017-03-02 DIAGNOSIS — M1A3720 Chronic gout due to renal impairment, left ankle and foot, without tophus (tophi): Secondary | ICD-10-CM | POA: Diagnosis not present

## 2017-03-10 ENCOUNTER — Ambulatory Visit (INDEPENDENT_AMBULATORY_CARE_PROVIDER_SITE_OTHER): Payer: Medicare Other | Admitting: Cardiovascular Disease

## 2017-03-10 ENCOUNTER — Encounter: Payer: Self-pay | Admitting: Cardiovascular Disease

## 2017-03-10 VITALS — BP 112/62 | HR 68 | Ht 63.0 in | Wt 184.0 lb

## 2017-03-10 DIAGNOSIS — Z950 Presence of cardiac pacemaker: Secondary | ICD-10-CM

## 2017-03-10 DIAGNOSIS — I10 Essential (primary) hypertension: Secondary | ICD-10-CM | POA: Diagnosis not present

## 2017-03-10 DIAGNOSIS — I480 Paroxysmal atrial fibrillation: Secondary | ICD-10-CM | POA: Diagnosis not present

## 2017-03-10 NOTE — Progress Notes (Signed)
SUBJECTIVE: The patient presents for follow-up of paroxysmal atrial fibrillation and hypertension.  The patient denies any symptoms of chest pain, palpitations, shortness of breath, lightheadedness, dizziness, leg swelling, orthopnea, PND, and syncope.  She has 4 daughters and 6 sons.  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    Review of Systems: As per "subjective", otherwise negative.  Allergies  Allergen Reactions  . Dihydrocodeine Other (See Comments)  . Other     Blisters after wear compression stockings    Current Outpatient Medications  Medication Sig Dispense Refill  . amiodarone (PACERONE) 200 MG tablet TAKE ONE TABLET BY MOUTH FOR 5 DAYS A WEEK. (MONDAY through [facility])    . anastrozole (ARIMIDEX) 1 MG tablet TAKE ONE TABLET BY MOUTH DAILY 30 tablet 5  . benazepril (LOTENSIN) 40 MG tablet     . citalopram (CELEXA) 20 MG tablet     . Cranberry 400 MG CAPS Take by mouth.    . diazepam (VALIUM) 5 MG tablet     . Docusate Calcium (STOOL SOFTENER PO) Take by mouth.    . doxazosin (CARDURA) 2 MG tablet     . furosemide (LASIX) 40 MG tablet     . gemfibrozil (LOPID) 600 MG tablet     . lansoprazole (PREVACID) 30 MG capsule Take 30 mg by mouth daily at 12 noon.    . Multiple Vitamin (MULTIVITAMIN) tablet Take 1 tablet by mouth daily.    . pantoprazole (PROTONIX) 20 MG tablet     . rivaroxaban (XARELTO) 20 MG TABS tablet Take 1 tablet (20 mg total) by mouth daily with supper. 90 tablet 3  . sodium bicarbonate 650 MG tablet Take 650 mg by mouth 4 (four) times daily.    Marland Kitchen ULORIC 40 MG tablet     . verapamil (CALAN-SR) 240 MG CR tablet     . vitamin C (ASCORBIC ACID) 500 MG tablet Take 500 mg by mouth daily.    . Vitamin D, Ergocalciferol, (DRISDOL) 50000 units CAPS capsule TAKE ONE CAPSULE BY MOUTH ONCE A WEEK 12 capsule 0   No current facility-administered medications for this visit.      Past Medical History:  Diagnosis Date  . Arthritis   . Breast cancer ([place])   . Cellulitis    Right leg  . CHF (congestive heart failure) ([place])   . Hyperlipidemia   . Hypertension   . Lobular carcinoma of right breast ([place]) 09/27/2014  . Malignant neoplasm of upper-outer quadrant of right female breast ([place]) 09/27/2014  . Paroxysmal atrial fibrillation (HCC)   . Varicose veins    venous stasis skin changes    Past Surgical History:  Procedure Laterality Date  . ABDOMINAL HYSTERECTOMY    . CHOLECYSTECTOMY    . FOOT SURGERY     Multiple foot surgeries by Dr. Blanch Media  . JOINT REPLACEMENT     bilateral knee by Drs. McGee and [place]  . PACEMAKER IMPLANT Left 05/12/2013   MDT Sherril Croon PPM implanted by Dr Dot Lanes for CHB and SSS  . SHOULDER SURGERY      Social History   Socioeconomic History  . Marital status: Divorced    Spouse name: Not on file  . Number of children: Not on file  . Years of education: Not on file  . Highest education level: Not on file  Social Needs  . Financial resource strain: Not on file  . Food  insecurity - worry: Not on file  . Food insecurity - inability: Not on file  . Transportation needs - medical: Not on file  . Transportation needs - non-medical: Not on file  Occupational History  . Not on file  Tobacco Use  . Smoking status: Never Smoker  . Smokeless tobacco: Never Used  Substance and Sexual Activity  . Alcohol use: No  . Drug use: No  . Sexual activity: Not on file  Other Topics Concern  . Not on file  Social History Narrative   Lives in [place]   Divorced   Retired from [employer]:   03/10/17 0948  BP: 112/62  Pulse: 68  SpO2: 98%  Weight: 184 lb (83.5 kg)  Height: _0  (1.6 m)    Wt Readings from Last 3 Encounters:  03/10/17 184 lb (83.5 kg)  12/12/16 185 lb (83.9 kg)  12/08/16 185 lb (83.9 kg)     PHYSICAL EXAM General: NAD HEENT: Normal. Neck: No JVD, no thyromegaly. Lungs:  Clear to auscultation bilaterally with normal respiratory effort. CV: Regular rate and rhythm, normal S1/S2, no S3/S4, no murmur. No pretibial or periankle edema.  No carotid bruit.   Abdomen: Soft, nontender, no distention.  Neurologic: Alert and oriented.  Psych: Normal affect. Skin: Normal. Musculoskeletal: No gross deformities.    ECG: Most recent ECG reviewed.   Labs: Lab Results  Component Value Date/Time   K 4.3 11/19/2016 10:33 AM   BUN 23 (H) 11/19/2016 10:33 AM   CREATININE 1.55 (H) 11/19/2016 10:33 AM   ALT 16 11/19/2016 10:33 AM   HGB 11.4 (L) 11/19/2016 10:33 AM     Lipids: No results found for: LDLCALC, LDLDIRECT, CHOL, TRIG, HDL     ASSESSMENT AND PLAN:  1. Paroxysmal atrial fibrillation: She is currently on amiodarone 200 mg every other day and Ditullio-acting verapamil 240 mg. CHADSVASC score is 4. Continue Xarelto 20 mg daily.  I will reduce amiodarone to 100 mg daily and will likely discontinue this in the future.  2. Cardiac pacemaker for congenital heart block: Device function is normal.  She is now being followed by Dr. Rayann Heman and in our device clinic.  3. Hypertension: Controlled.  No changes to therapy.     Disposition: Follow up 6 months   Kate Sable, M.D., F.A.C.C.

## 2017-03-10 NOTE — Patient Instructions (Addendum)
Medication Instructions:   Decrease Amiodarone to 161m daily (1/2 tab of the 2030mtablet).  Continue all other medications.    Labwork: none  Testing/Procedures: none  Follow-Up: Your physician wants you to follow up in: 6 months.  You will receive a reminder letter in the mail one-two months in advance.  If you don't receive a letter, please call our office to schedule the follow up appointment   Any Other Special Instructions Will Be Listed Below (If Applicable).  If you need a refill on your cardiac medications before your next appointment, please call your pharmacy.

## 2017-03-16 ENCOUNTER — Encounter: Payer: Medicare Other | Admitting: *Deleted

## 2017-03-16 ENCOUNTER — Telehealth: Payer: Self-pay | Admitting: Cardiology

## 2017-03-16 NOTE — Telephone Encounter (Signed)
Attempted to confirm remote transmission with pt. No answer and was unable to leave a message.   

## 2017-03-19 ENCOUNTER — Encounter: Payer: Self-pay | Admitting: Cardiology

## 2017-03-30 ENCOUNTER — Ambulatory Visit (INDEPENDENT_AMBULATORY_CARE_PROVIDER_SITE_OTHER): Payer: Medicare Other | Admitting: *Deleted

## 2017-03-30 DIAGNOSIS — I480 Paroxysmal atrial fibrillation: Secondary | ICD-10-CM

## 2017-03-30 DIAGNOSIS — I442 Atrioventricular block, complete: Secondary | ICD-10-CM

## 2017-03-31 NOTE — Progress Notes (Signed)
Remote pacemaker transmission.

## 2017-04-01 LAB — CUP PACEART REMOTE DEVICE CHECK
Battery Impedance: 526 Ohm
Battery Remaining Longevity: 72 mo
Battery Voltage: 2.78 V
Brady Statistic AP VP Percent: 32 %
Brady Statistic AP VS Percent: 0 %
Brady Statistic AS VP Percent: 68 %
Brady Statistic AS VS Percent: 0 %
Date Time Interrogation Session: 20181123144409
Implantable Lead Implant Date: 20150108
Implantable Lead Implant Date: 20150108
Implantable Lead Location: 753859
Implantable Lead Location: 753860
Implantable Lead Model: 5076
Implantable Lead Model: 5076
Implantable Pulse Generator Implant Date: 20150108
Lead Channel Impedance Value: 395 Ohm
Lead Channel Impedance Value: 411 Ohm
Lead Channel Pacing Threshold Amplitude: 0.625 V
Lead Channel Pacing Threshold Amplitude: 1 V
Lead Channel Pacing Threshold Pulse Width: 0.4 ms
Lead Channel Pacing Threshold Pulse Width: 0.4 ms
Lead Channel Sensing Intrinsic Amplitude: 0.7 mV
Lead Channel Setting Pacing Amplitude: 2 V
Lead Channel Setting Pacing Amplitude: 2.5 V
Lead Channel Setting Pacing Pulse Width: 0.4 ms
Lead Channel Setting Sensing Sensitivity: 2 mV

## 2017-04-03 ENCOUNTER — Encounter: Payer: Self-pay | Admitting: Cardiology

## 2017-04-30 DIAGNOSIS — J40 Bronchitis, not specified as acute or chronic: Secondary | ICD-10-CM | POA: Diagnosis not present

## 2017-04-30 DIAGNOSIS — M545 Low back pain: Secondary | ICD-10-CM | POA: Diagnosis not present

## 2017-05-06 ENCOUNTER — Other Ambulatory Visit (HOSPITAL_COMMUNITY): Payer: Self-pay | Admitting: Oncology

## 2017-05-06 DIAGNOSIS — C50919 Malignant neoplasm of unspecified site of unspecified female breast: Secondary | ICD-10-CM

## 2017-05-26 ENCOUNTER — Ambulatory Visit (HOSPITAL_COMMUNITY): Payer: Medicare Other | Admitting: Adult Health

## 2017-05-26 ENCOUNTER — Other Ambulatory Visit (HOSPITAL_COMMUNITY): Payer: Medicare Other

## 2017-05-27 ENCOUNTER — Encounter (HOSPITAL_COMMUNITY): Payer: Self-pay | Admitting: Internal Medicine

## 2017-05-27 ENCOUNTER — Inpatient Hospital Stay (HOSPITAL_BASED_OUTPATIENT_CLINIC_OR_DEPARTMENT_OTHER): Payer: Medicare Other | Admitting: Internal Medicine

## 2017-05-27 ENCOUNTER — Inpatient Hospital Stay (HOSPITAL_COMMUNITY): Payer: Medicare Other | Attending: Oncology

## 2017-05-27 VITALS — BP 135/79 | HR 66 | Temp 98.3°F | Resp 18 | Wt 185.9 lb

## 2017-05-27 DIAGNOSIS — D638 Anemia in other chronic diseases classified elsewhere: Secondary | ICD-10-CM | POA: Diagnosis not present

## 2017-05-27 DIAGNOSIS — Z79811 Long term (current) use of aromatase inhibitors: Secondary | ICD-10-CM

## 2017-05-27 DIAGNOSIS — N189 Chronic kidney disease, unspecified: Secondary | ICD-10-CM

## 2017-05-27 DIAGNOSIS — M170 Bilateral primary osteoarthritis of knee: Secondary | ICD-10-CM | POA: Insufficient documentation

## 2017-05-27 DIAGNOSIS — C50411 Malignant neoplasm of upper-outer quadrant of right female breast: Secondary | ICD-10-CM

## 2017-05-27 DIAGNOSIS — M8580 Other specified disorders of bone density and structure, unspecified site: Secondary | ICD-10-CM

## 2017-05-27 DIAGNOSIS — Z170 Estrogen receptor positive status [ER+]: Secondary | ICD-10-CM | POA: Insufficient documentation

## 2017-05-27 DIAGNOSIS — C50911 Malignant neoplasm of unspecified site of right female breast: Secondary | ICD-10-CM

## 2017-05-27 LAB — COMPREHENSIVE METABOLIC PANEL
ALT: 15 U/L (ref 14–54)
AST: 23 U/L (ref 15–41)
Albumin: 3.5 g/dL (ref 3.5–5.0)
Alkaline Phosphatase: 88 U/L (ref 38–126)
Anion gap: 12 (ref 5–15)
BUN: 17 mg/dL (ref 6–20)
CO2: 25 mmol/L (ref 22–32)
Calcium: 8.9 mg/dL (ref 8.9–10.3)
Chloride: 103 mmol/L (ref 101–111)
Creatinine, Ser: 1.3 mg/dL — ABNORMAL HIGH (ref 0.44–1.00)
GFR calc Af Amer: 45 mL/min — ABNORMAL LOW (ref >60)
GFR calc non Af Amer: 39 mL/min — ABNORMAL LOW (ref >60)
Glucose, Bld: 122 mg/dL — ABNORMAL HIGH (ref 65–99)
Potassium: 3.8 mmol/L (ref 3.5–5.1)
Sodium: 140 mmol/L (ref 135–145)
Total Bilirubin: 0.5 mg/dL (ref 0.3–1.2)
Total Protein: 6.7 g/dL (ref 6.5–8.1)

## 2017-05-27 LAB — CBC WITH DIFFERENTIAL/PLATELET
Basophils Absolute: 0.1 10*3/uL (ref 0.0–0.1)
Basophils Relative: 1 %
Eosinophils Absolute: 0.3 10*3/uL (ref 0.0–0.7)
Eosinophils Relative: 5 %
HCT: 33.9 % — ABNORMAL LOW (ref 36.0–46.0)
Hemoglobin: 10.1 g/dL — ABNORMAL LOW (ref 12.0–15.0)
Lymphocytes Relative: 17 %
Lymphs Abs: 1 10*3/uL (ref 0.7–4.0)
MCH: 27.2 pg (ref 26.0–34.0)
MCHC: 29.8 g/dL — ABNORMAL LOW (ref 30.0–36.0)
MCV: 91.1 fL (ref 78.0–100.0)
Monocytes Absolute: 0.4 10*3/uL (ref 0.1–1.0)
Monocytes Relative: 6 %
Neutro Abs: 4 10*3/uL (ref 1.7–7.7)
Neutrophils Relative %: 71 %
Platelets: 264 10*3/uL (ref 150–400)
RBC: 3.72 MIL/uL — ABNORMAL LOW (ref 3.87–5.11)
RDW: 14.1 % (ref 11.5–15.5)
WBC: 5.8 10*3/uL (ref 4.0–10.5)

## 2017-05-27 NOTE — Progress Notes (Signed)
REASON FOR VISIT:  Follow-up for Stage IA right breast invasive lobular carcinoma, ER+/PR+/HER2- currently on Arimidex.  HPI: She is doing well, no sideeffects from [place]. Stays active, no new bonepains, no weight loss, no recent falls Did not feel any suspicious change sin her breast or axillae. No vaginal bleeding . Bowel are regulalr, no melena, appetite and weight are stable, no chest pain, SOB, or extremity edema. She does have bilateral knee pain from DJD, is contemplating a knee replcement. All other sytem review is negative  ONCOLOGIC HISTORY:    Lobular carcinoma of right breast ([place])   08/30/2014 Mammogram    Ill-defined shadowing mass at 10:00 position 6 cm from the nipple measuring [phone] cm in size without lymphadenopathy seen in the right axilla.      09/06/2014 Procedure    Biopsy of right breast mass at 10 o'clock position.      09/08/2014 Pathology Results    Invasive carcinoma, favor invasive ductal carcinoma.  Some features are suggestive of lobular carcinoma.  Negative e-cadherin immunostaining throughout the lesional cells supports the diagnosis of invasive lobular carcinoma.      09/08/2014 Pathology Results    ER 90%, PR 90%. HER2 FISH negative, Ki-67 26%.      09/20/2014 Definitive Surgery    Lumpectomy      09/20/2014 Pathology Results    Invasive lobular carcinoma, 0.6 cm in maximal dimension, closest margin is 0.3 cm at posterior resection margin. Grade 2. No LV I. 0/1 lymph nodes.      09/27/2014 -  Anti-estrogen oral therapy    Arimidex      08/08/2015 Mammogram    BIRADS 2      09/20/2015 Procedure    Right breast lumpectomy by Dr. Anthony Sar with sentinel lymph node biopsy.      PHYSICAL EXAM:  ECOG Performance status: 0 - Asymptomatic.  Breasts: Righjt breast lumpectomy site is well healed, no suspicious changes,, lumps. Left breast normal without mass, skin or nipple changes. No palpable axillary nodes  Bilaterally. Ppm noted in the left  chest wall  LABORATORY DATA:   CBC shows decline in Hgb to 10.1, normal MCV, normal wbc and platelets. Serum cr is 1.3- stable, rest of cmp normal Meds are reviewed, she remains on Xarelto.  ASSESSMENT & PLAN:   Stage Ia right breast invasive lobular carcinoma ER PR positive HER-2/neu negative diagnosed in May 2016 status post right breast lumpectomy followed by adjuvant radiation.  Began anastrozole in June 2016.  Patient is tolerating it well with no symptoms significant side effects.  Today based on my exam he she does not have any evidence of disease recurrence.  Her most recent mammogram was from April 2018 at [facility] was normal. Next mammogram is due in April 2019 which has been set up. We will contact her with results. the patient was also asked to call us for results in 2 days after mammogram is complete. Assuming that is benign she will return for follow-up in 6 months. Refill anastrozole.  Postmenopausal osteopenia on high risk med (Arimidex) Last DEXA scan from April 2017 showed osteopenia with a T score of -1.7  She takes calcium and vitamin D supplements and continues weightbearing exercises  Biennual DEXA scan due in April 2019 which has also been ordered today.   Previously noted left submandibular lesion is no longer palpable.    History of chronic kidney disease that  is chronic .Serum creatinine is stable at 1.3..    Anemia some  decline in hemoglobin noted today hemoglobin is 10.1 we will check iron panel and B12 level at next visit.    H/o Complete heart block s/p PPM, in normal rhythm today. Continue follow-up with cardiology.  Return for follow-up in 6 months

## 2017-05-27 NOTE — Patient Instructions (Signed)
[place] at [facility] Discharge Instructions  RECOMMENDATIONS MADE BY THE CONSULTANT AND ANY TEST RESULTS WILL BE SENT TO YOUR REFERRING PHYSICIAN.  Seen by Dr. Sherrine Maples today. Follow-up as scheduled  Thank you for choosing [place] at [facility] to provide your oncology and hematology care.  To afford each patient quality time with our provider, please arrive at least 15 minutes before your scheduled appointment time.    If you have a lab appointment with the [place] [facility] please come in thru the  Main Entrance and check in at the main information desk  You need to re-schedule your appointment should you arrive 10 or more minutes late.  We strive to give you quality time with our providers, and arriving late affects you and other patients whose appointments are after yours.  Also, if you no show three or more times for appointments you may be dismissed from the clinic at the providers discretion.     Again, thank you for choosing [facility].  Our hope is that these requests will decrease the amount of time that you wait before being seen by our physicians.       _____________________________________________________________  Should you have questions after your visit to [facility], please contact our office at [phone] between the hours of 8:30 a.m. and 4:30 p.m.  Voicemails left after 4:30 p.m. will not be returned until the following business day.  For prescription refill requests, have your pharmacy contact our office.       Resources For Cancer Patients and their Caregivers ? American Cancer Society: Can assist with transportation, wigs, general needs, runs Look Good Feel Better.        279 220 4449 ? Cancer Care: Provides financial assistance, online support groups, medication/co-pay assistance.  1-800-813-HOPE (862)704-6824) ? [facility] Assists [place] cancer patients  and their families through emotional , educational and financial support.  401-265-8133 ? [place] DSS Where to apply for food stamps, Medicaid and utility assistance. 6231582394 ? RCATS: Transportation to medical appointments. (515)031-6375 ? Social Security Administration: May apply for disability if have a Stage IV cancer. 510-052-8162 9717111585 ? [employer], Disability and Transit Services: Assists with nutrition, care and transit needs. [place] Support Programs: _0 @ > Cancer Support Group  2nd Tuesday of the month 1pm-2pm, Journey Room  > Creative Journey  3rd Tuesday of the month 1130am-1pm, Journey Room  > Look Good Feel Better  1st Wednesday of the month 10am-12 noon, Journey Room (Call [place] to register [phone])

## 2017-06-01 ENCOUNTER — Other Ambulatory Visit (HOSPITAL_COMMUNITY): Payer: Self-pay | Admitting: Oncology

## 2017-06-01 NOTE — Progress Notes (Signed)
Needs lab draw for B12 and Iron panel ASAP. Sent message to triage nurse

## 2017-06-01 NOTE — Telephone Encounter (Signed)
I will fill this for 1 month's supply. Future refills should come from cardiology or her PCP. Please let patient know.   Mike Craze, NP [place] [phone]

## 2017-06-04 DIAGNOSIS — M79601 Pain in right arm: Secondary | ICD-10-CM | POA: Diagnosis not present

## 2017-06-05 ENCOUNTER — Inpatient Hospital Stay (HOSPITAL_COMMUNITY): Payer: Medicare Other | Attending: Oncology

## 2017-06-05 ENCOUNTER — Other Ambulatory Visit (HOSPITAL_COMMUNITY): Payer: Self-pay

## 2017-06-05 DIAGNOSIS — Z170 Estrogen receptor positive status [ER+]: Secondary | ICD-10-CM | POA: Insufficient documentation

## 2017-06-05 DIAGNOSIS — C50911 Malignant neoplasm of unspecified site of right female breast: Secondary | ICD-10-CM

## 2017-06-05 DIAGNOSIS — Z79811 Long term (current) use of aromatase inhibitors: Secondary | ICD-10-CM | POA: Diagnosis not present

## 2017-06-05 DIAGNOSIS — N189 Chronic kidney disease, unspecified: Secondary | ICD-10-CM | POA: Insufficient documentation

## 2017-06-05 DIAGNOSIS — C50411 Malignant neoplasm of upper-outer quadrant of right female breast: Secondary | ICD-10-CM | POA: Diagnosis present

## 2017-06-05 DIAGNOSIS — D638 Anemia in other chronic diseases classified elsewhere: Secondary | ICD-10-CM

## 2017-06-05 LAB — VITAMIN B12: Vitamin B-12: 493 pg/mL (ref 180–914)

## 2017-06-06 LAB — FERRITIN: Ferritin: 26 ng/mL (ref 11–307)

## 2017-06-06 LAB — IRON AND TIBC
Iron: 31 ug/dL (ref 28–170)
Saturation Ratios: 9 % — ABNORMAL LOW (ref 10.4–31.8)
TIBC: 353 ug/dL (ref 250–450)
UIBC: 322 ug/dL

## 2017-06-17 NOTE — Progress Notes (Signed)
This patient needs to be set up for IV iron.   Please set patient up for 2 doses of IV Feraheme 510 mg.  Calculated iron deficit is ~705 mg with a target HGB of 14 g/dL.    Will send message to scheduling to get scheduled.It does not appear this patient is ever received IV iron in the past. She was seen last by Dr. [place].  She is being seen for stage IA right breast cancer. She is currently on Arimidex daily. Patient does have a history of chronic kidney disease stage III which appears to be managed by a nephrologist.  Faythe Casa, NP 06/18/2017 2:47 PM

## 2017-06-25 ENCOUNTER — Encounter (HOSPITAL_COMMUNITY): Payer: Self-pay

## 2017-06-25 ENCOUNTER — Inpatient Hospital Stay (HOSPITAL_COMMUNITY): Payer: Medicare Other

## 2017-06-25 VITALS — BP 136/55 | HR 60 | Temp 97.9°F | Resp 18

## 2017-06-25 DIAGNOSIS — N189 Chronic kidney disease, unspecified: Secondary | ICD-10-CM | POA: Diagnosis not present

## 2017-06-25 DIAGNOSIS — Z170 Estrogen receptor positive status [ER+]: Secondary | ICD-10-CM | POA: Diagnosis not present

## 2017-06-25 DIAGNOSIS — Z79811 Long term (current) use of aromatase inhibitors: Secondary | ICD-10-CM | POA: Diagnosis not present

## 2017-06-25 DIAGNOSIS — C50411 Malignant neoplasm of upper-outer quadrant of right female breast: Secondary | ICD-10-CM | POA: Diagnosis not present

## 2017-06-25 DIAGNOSIS — D509 Iron deficiency anemia, unspecified: Secondary | ICD-10-CM | POA: Insufficient documentation

## 2017-06-25 DIAGNOSIS — D638 Anemia in other chronic diseases classified elsewhere: Secondary | ICD-10-CM | POA: Diagnosis not present

## 2017-06-25 DIAGNOSIS — D508 Other iron deficiency anemias: Secondary | ICD-10-CM

## 2017-06-25 MED ORDER — FERUMOXYTOL INJECTION 510 MG/17 ML
510.0000 mg | Freq: Once | INTRAVENOUS | Status: AC
  Administered 2017-06-25: 510 mg via INTRAVENOUS
  Filled 2017-06-25: qty 17

## 2017-06-25 MED ORDER — SODIUM CHLORIDE 0.9 % IV SOLN
Freq: Once | INTRAVENOUS | Status: AC
  Administered 2017-06-25: 12:00:00 via INTRAVENOUS

## 2017-06-25 MED ORDER — SODIUM CHLORIDE 0.9% FLUSH
10.0000 mL | INTRAVENOUS | Status: DC | PRN
  Administered 2017-06-25: 10 mL
  Filled 2017-06-25: qty 10

## 2017-06-25 NOTE — Progress Notes (Signed)
Alicia Nash tolerated Feraheme infusion well without complaints or incident. VSS upon discharge. Pt discharged self ambulatory in satisfactory condition 

## 2017-06-25 NOTE — Patient Instructions (Signed)
[place] at [facility] Discharge Instructions  RECOMMENDATIONS MADE BY THE CONSULTANT AND ANY TEST RESULTS WILL BE SENT TO YOUR REFERRING PHYSICIAN.  Received Feraheme infusion today.Follow-up as scheduled. Call clinic for any questions or concerns  Thank you for choosing [place] at [facility] to provide your oncology and hematology care.  To afford each patient quality time with our provider, please arrive at least 15 minutes before your scheduled appointment time.    If you have a lab appointment with the [place] please come in thru the  Main Entrance and check in at the main information desk  You need to re-schedule your appointment should you arrive 10 or more minutes late.  We strive to give you quality time with our providers, and arriving late affects you and other patients whose appointments are after yours.  Also, if you no show three or more times for appointments you may be dismissed from the clinic at the providers discretion.     Again, thank you for choosing [facility].  Our hope is that these requests will decrease the amount of time that you wait before being seen by our physicians.       _____________________________________________________________  Should you have questions after your visit to [facility], please contact our office at [phone] between the hours of 8:30 a.m. and 4:30 p.m.  Voicemails left after 4:30 p.m. will not be returned until the following business day.  For prescription refill requests, have your pharmacy contact our office.       Resources For Cancer Patients and their Caregivers ? American Cancer Society: Can assist with transportation, wigs, general needs, runs Look Good Feel Better.        416-756-8162 ? Cancer Care: Provides financial assistance, online support groups, medication/co-pay assistance.  1-800-813-HOPE 3074943845) ? [facility] Assists [place] cancer patients and their families through emotional , educational and financial support.  920-166-3346 ? [place] DSS Where to apply for food stamps, Medicaid and utility assistance. 775-238-4155 ? RCATS: Transportation to medical appointments. 4080672763 ? Social Security Administration: May apply for disability if have a Stage IV cancer. 307 727 1955 (802) 681-4445 ? [employer], Disability and Transit Services: Assists with nutrition, care and transit needs. [place] Support Programs: _0 @ > Cancer Support Group  2nd Tuesday of the month 1pm-2pm, Journey Room  > Creative Journey  3rd Tuesday of the month 1130am-1pm, Journey Room  > Look Good Feel Better  1st Wednesday of the month 10am-12 noon, Journey Room (Call [place] to register [phone])

## 2017-06-29 ENCOUNTER — Telehealth: Payer: Self-pay | Admitting: Cardiology

## 2017-06-29 ENCOUNTER — Encounter: Payer: Medicare Other | Admitting: *Deleted

## 2017-06-29 NOTE — Telephone Encounter (Signed)
LMOVM reminding pt to send remote transmission.   

## 2017-07-01 ENCOUNTER — Encounter: Payer: Self-pay | Admitting: Cardiology

## 2017-07-01 ENCOUNTER — Other Ambulatory Visit (HOSPITAL_COMMUNITY): Payer: Self-pay | Admitting: Adult Health

## 2017-07-08 DIAGNOSIS — I951 Orthostatic hypotension: Secondary | ICD-10-CM | POA: Diagnosis not present

## 2017-07-12 DIAGNOSIS — E785 Hyperlipidemia, unspecified: Secondary | ICD-10-CM | POA: Diagnosis not present

## 2017-07-12 DIAGNOSIS — N182 Chronic kidney disease, stage 2 (mild): Secondary | ICD-10-CM | POA: Diagnosis not present

## 2017-07-12 DIAGNOSIS — Z886 Allergy status to analgesic agent status: Secondary | ICD-10-CM | POA: Diagnosis not present

## 2017-07-12 DIAGNOSIS — Z79899 Other long term (current) drug therapy: Secondary | ICD-10-CM | POA: Diagnosis not present

## 2017-07-12 DIAGNOSIS — I129 Hypertensive chronic kidney disease with stage 1 through stage 4 chronic kidney disease, or unspecified chronic kidney disease: Secondary | ICD-10-CM | POA: Diagnosis not present

## 2017-07-12 DIAGNOSIS — Z950 Presence of cardiac pacemaker: Secondary | ICD-10-CM | POA: Diagnosis not present

## 2017-07-12 DIAGNOSIS — K921 Melena: Secondary | ICD-10-CM | POA: Diagnosis not present

## 2017-07-12 DIAGNOSIS — D509 Iron deficiency anemia, unspecified: Secondary | ICD-10-CM | POA: Diagnosis not present

## 2017-07-12 DIAGNOSIS — K449 Diaphragmatic hernia without obstruction or gangrene: Secondary | ICD-10-CM | POA: Diagnosis not present

## 2017-07-12 DIAGNOSIS — Z8249 Family history of ischemic heart disease and other diseases of the circulatory system: Secondary | ICD-10-CM | POA: Diagnosis not present

## 2017-07-12 DIAGNOSIS — Z833 Family history of diabetes mellitus: Secondary | ICD-10-CM | POA: Diagnosis not present

## 2017-07-12 DIAGNOSIS — D62 Acute posthemorrhagic anemia: Secondary | ICD-10-CM | POA: Diagnosis not present

## 2017-07-12 DIAGNOSIS — N390 Urinary tract infection, site not specified: Secondary | ICD-10-CM | POA: Diagnosis not present

## 2017-07-12 DIAGNOSIS — R531 Weakness: Secondary | ICD-10-CM | POA: Diagnosis not present

## 2017-07-12 DIAGNOSIS — Z79891 Long term (current) use of opiate analgesic: Secondary | ICD-10-CM | POA: Diagnosis not present

## 2017-07-12 DIAGNOSIS — D649 Anemia, unspecified: Secondary | ICD-10-CM | POA: Diagnosis not present

## 2017-07-12 DIAGNOSIS — M1990 Unspecified osteoarthritis, unspecified site: Secondary | ICD-10-CM | POA: Diagnosis not present

## 2017-07-12 DIAGNOSIS — Z780 Asymptomatic menopausal state: Secondary | ICD-10-CM | POA: Diagnosis not present

## 2017-07-12 DIAGNOSIS — Z853 Personal history of malignant neoplasm of breast: Secondary | ICD-10-CM | POA: Diagnosis not present

## 2017-07-12 DIAGNOSIS — M109 Gout, unspecified: Secondary | ICD-10-CM | POA: Diagnosis not present

## 2017-07-12 DIAGNOSIS — K219 Gastro-esophageal reflux disease without esophagitis: Secondary | ICD-10-CM | POA: Diagnosis not present

## 2017-07-12 DIAGNOSIS — Z23 Encounter for immunization: Secondary | ICD-10-CM | POA: Diagnosis not present

## 2017-07-13 ENCOUNTER — Encounter: Payer: Self-pay | Admitting: Internal Medicine

## 2017-07-14 ENCOUNTER — Encounter: Payer: Self-pay | Admitting: Internal Medicine

## 2017-07-15 DIAGNOSIS — Z23 Encounter for immunization: Secondary | ICD-10-CM | POA: Diagnosis not present

## 2017-07-16 ENCOUNTER — Encounter: Payer: Self-pay | Admitting: *Deleted

## 2017-07-21 ENCOUNTER — Ambulatory Visit (INDEPENDENT_AMBULATORY_CARE_PROVIDER_SITE_OTHER): Payer: Medicare Other | Admitting: *Deleted

## 2017-07-21 DIAGNOSIS — I4891 Unspecified atrial fibrillation: Secondary | ICD-10-CM

## 2017-07-21 DIAGNOSIS — Z5181 Encounter for therapeutic drug level monitoring: Secondary | ICD-10-CM | POA: Diagnosis not present

## 2017-07-21 NOTE — Progress Notes (Signed)
Pt was started on Xarelto 50m daily for PAF on 12/08/16 by Dr KBronson Ing.    [facility].  States she was very anemic and received 4 U PRBC.  Lab unavailable.  States he had endo and colonoscopy by Dr DAnthony Sar  No problems found to account for blood loss.  Has Hx of anemia and received iron infusions.  Pt is being referred to Dr RLaural Goldenfor further evaluation.  Reviewed patients medication list.  Pt is not currently on any combined P-gp and strong CYP3A4 inhibitors/inducers (ketoconazole, traconazole, ritonavir, carbamazepine, phenytoin, rifampin, St. John's wort).  Reviewed labs from 05/27/17.  SCr 1.3, Weight 82.45, CrCl 47.17  Dose is appropriate based on CrCl.   Hgb and HCT:  10.1/33.9  A full discussion of the nature of anticoagulants has been carried out.  A benefit/risk analysis has been presented to the patient, so that they understand the justification for choosing anticoagulation with Xarelto at this time.  The need for compliance is stressed.  Pt is aware to take the medication once daily with the largest meal of the day.  Side effects of potential bleeding are discussed, including unusual colored urine or stools, coughing up blood or coffee ground emesis, nose bleeds or serious fall or head trauma.  Discussed signs and symptoms of stroke. The patient should avoid any OTC items containing aspirin or ibuprofen.  Avoid alcohol consumption.   Call if any signs of abnormal bleeding.  Discussed financial obligations and resolved any difficulty in obtaining medication.  Next lab test in 6 months.   Appointment made for 01/21/18

## 2017-07-29 DIAGNOSIS — D508 Other iron deficiency anemias: Secondary | ICD-10-CM | POA: Diagnosis not present

## 2017-07-30 ENCOUNTER — Other Ambulatory Visit (HOSPITAL_COMMUNITY): Payer: Self-pay | Admitting: Adult Health

## 2017-07-30 NOTE — Telephone Encounter (Signed)
Patient notified.

## 2017-07-30 NOTE — Telephone Encounter (Signed)
I am declining refilling this medication. It should be refilled by her cardiologist, as they may recommend dose/frequency changes.  Please have her contact her cardiology team for refills.   Mike Craze, NP [place] [phone]

## 2017-07-31 ENCOUNTER — Encounter: Payer: Self-pay | Admitting: Internal Medicine

## 2017-07-31 DIAGNOSIS — I951 Orthostatic hypotension: Secondary | ICD-10-CM | POA: Diagnosis not present

## 2017-07-31 DIAGNOSIS — D508 Other iron deficiency anemias: Secondary | ICD-10-CM | POA: Diagnosis not present

## 2017-08-12 ENCOUNTER — Other Ambulatory Visit (HOSPITAL_COMMUNITY): Payer: Self-pay | Admitting: Hematology

## 2017-08-12 DIAGNOSIS — Z853 Personal history of malignant neoplasm of breast: Secondary | ICD-10-CM

## 2017-08-17 ENCOUNTER — Telehealth (INDEPENDENT_AMBULATORY_CARE_PROVIDER_SITE_OTHER): Payer: Self-pay | Admitting: *Deleted

## 2017-08-17 ENCOUNTER — Telehealth (INDEPENDENT_AMBULATORY_CARE_PROVIDER_SITE_OTHER): Payer: Self-pay | Admitting: Internal Medicine

## 2017-08-17 NOTE — Telephone Encounter (Signed)
err

## 2017-08-17 NOTE — Telephone Encounter (Signed)
We need to schedule patient for GIVENS capsule study, however, we need medical clearance from cardiologist since she has a pacemaker -- GIVENS capsules have not been tested and FDA approved for patients that have pacemakers because there are so many different ones on the market -- please advise if ok to proceed with GIVENS capsule

## 2017-08-17 NOTE — Telephone Encounter (Signed)
Alicia Nash, Given capsule.  Let me know when she is scheduled so I can order a CBC. Thanks.

## 2017-08-17 NOTE — Telephone Encounter (Signed)
I spoke with patient and she has a pacemaker. She does not have a defibrillator

## 2017-08-18 ENCOUNTER — Encounter (HOSPITAL_COMMUNITY): Payer: Self-pay

## 2017-08-18 ENCOUNTER — Other Ambulatory Visit (INDEPENDENT_AMBULATORY_CARE_PROVIDER_SITE_OTHER): Payer: Self-pay | Admitting: Internal Medicine

## 2017-08-18 ENCOUNTER — Ambulatory Visit (HOSPITAL_COMMUNITY)
Admission: RE | Admit: 2017-08-18 | Discharge: 2017-08-18 | Disposition: A | Discharge status: Home / Self Care | Payer: Medicare Other | Source: Ambulatory Visit | Attending: Internal Medicine | Admitting: Internal Medicine

## 2017-08-18 DIAGNOSIS — Z853 Personal history of malignant neoplasm of breast: Secondary | ICD-10-CM | POA: Diagnosis not present

## 2017-08-18 DIAGNOSIS — K922 Gastrointestinal hemorrhage, unspecified: Secondary | ICD-10-CM | POA: Insufficient documentation

## 2017-08-18 DIAGNOSIS — C50911 Malignant neoplasm of unspecified site of right female breast: Secondary | ICD-10-CM

## 2017-08-18 DIAGNOSIS — R928 Other abnormal and inconclusive findings on diagnostic imaging of breast: Secondary | ICD-10-CM | POA: Diagnosis not present

## 2017-08-18 NOTE — Telephone Encounter (Signed)
Noted, note fwd back to A. Wynonia Lawman.

## 2017-08-18 NOTE — Telephone Encounter (Signed)
GIVENS sch'd 08/26/17, patient aware

## 2017-08-18 NOTE — Telephone Encounter (Signed)
Ok to proceed.

## 2017-08-19 ENCOUNTER — Other Ambulatory Visit (HOSPITAL_COMMUNITY): Payer: Medicare Other

## 2017-08-26 ENCOUNTER — Encounter (HOSPITAL_COMMUNITY)
Admission: RE | Disposition: A | Discharge status: Home / Self Care | Payer: Self-pay | Source: Ambulatory Visit | Attending: Internal Medicine

## 2017-08-26 ENCOUNTER — Encounter (HOSPITAL_COMMUNITY): Payer: Self-pay | Admitting: *Deleted

## 2017-08-26 ENCOUNTER — Ambulatory Visit (HOSPITAL_COMMUNITY)
Admission: RE | Admit: 2017-08-26 | Discharge: 2017-08-26 | Disposition: A | Discharge status: Home / Self Care | Payer: Medicare Other | Source: Ambulatory Visit | Attending: Internal Medicine | Admitting: Internal Medicine

## 2017-08-26 DIAGNOSIS — Z7901 Long term (current) use of anticoagulants: Secondary | ICD-10-CM | POA: Diagnosis not present

## 2017-08-26 DIAGNOSIS — D509 Iron deficiency anemia, unspecified: Secondary | ICD-10-CM | POA: Diagnosis present

## 2017-08-26 DIAGNOSIS — K5520 Angiodysplasia of colon without hemorrhage: Secondary | ICD-10-CM | POA: Insufficient documentation

## 2017-08-26 DIAGNOSIS — K922 Gastrointestinal hemorrhage, unspecified: Secondary | ICD-10-CM | POA: Insufficient documentation

## 2017-08-26 HISTORY — PX: GIVENS CAPSULE STUDY: SHX5432

## 2017-08-26 SURGERY — IMAGING PROCEDURE, GI TRACT, INTRALUMINAL, VIA CAPSULE

## 2017-08-26 MED ORDER — SODIUM CHLORIDE 0.9 % IV SOLN: INTRAVENOUS | Status: DC

## 2017-08-28 ENCOUNTER — Encounter (HOSPITAL_COMMUNITY): Payer: Self-pay | Admitting: Internal Medicine

## 2017-09-02 ENCOUNTER — Other Ambulatory Visit (INDEPENDENT_AMBULATORY_CARE_PROVIDER_SITE_OTHER): Payer: Self-pay | Admitting: *Deleted

## 2017-09-02 DIAGNOSIS — D508 Other iron deficiency anemias: Secondary | ICD-10-CM

## 2017-09-02 DIAGNOSIS — D509 Iron deficiency anemia, unspecified: Secondary | ICD-10-CM | POA: Diagnosis not present

## 2017-09-02 DIAGNOSIS — Z9889 Other specified postprocedural states: Secondary | ICD-10-CM | POA: Diagnosis not present

## 2017-09-02 DIAGNOSIS — Q2733 Arteriovenous malformation of digestive system vessel: Secondary | ICD-10-CM | POA: Diagnosis not present

## 2017-09-02 DIAGNOSIS — K921 Melena: Secondary | ICD-10-CM | POA: Diagnosis not present

## 2017-09-02 NOTE — Op Note (Signed)
Small Bowel Givens Capsule Study Procedure date: 08/26/2017  Referring Provider:  Dr. Sherrie Sport, MD PCP:  Dr. [name], MD  Indication for procedure: Patient is 77 year old Caucasian female who was hospitalized [facility] last month with anemia and melena.  She underwent EGD and colonoscopy and no abnormality was found.  Subsequent iron studies show iron deficiency anemia.  She is therefore undergoing small bowel given capsule study looking for source of GI bleed. Please please note patient is on Xarelto.   Findings:  Patient was able to swallow given capsule without any difficulty. No mucosal abnormality noted involving gastric mucosa. Punctate AV malformations best seen on images at 02:18:13, 02:19:14, 03:39:39, 03:46:21 and 03:58:39.  2 of these covered with speck of blood no pooling noted. Limited view of colonic mucosa revealed no abnormality.   First Gastric image: 45 sec First Duodenal image: 57 min and 37 sec First Ileo-Cecal Valve image: 4 hrs 34 min and 33 sec First Cecal image: 4 hrs 42 min and 42 sec Gastric Passage time: 56 min and 52 sec Small Bowel Passage time: 3 hrs and 48 min  Summary & Recommendations:  Study is complete as given capsule reached cecum. Few small small bowel punctate AV malformations noted with speck of blood but no active bleeding noted. Findings reviewed with patient. She will have CBC this week. If bleeding recurs will consider GI bleeding scan.

## 2017-09-09 DIAGNOSIS — D508 Other iron deficiency anemias: Secondary | ICD-10-CM | POA: Diagnosis not present

## 2017-09-09 LAB — CBC
HCT: 30.7 % — ABNORMAL LOW (ref 35.0–45.0)
Hemoglobin: 10 g/dL — ABNORMAL LOW (ref 11.7–15.5)
MCH: 28.3 pg (ref 27.0–33.0)
MCHC: 32.6 g/dL (ref 32.0–36.0)
MCV: 87 fL (ref 80.0–100.0)
MPV: 12.1 fL (ref 7.5–12.5)
Platelets: 272 10*3/uL (ref 140–400)
RBC: 3.53 10*6/uL — ABNORMAL LOW (ref 3.80–5.10)
RDW: 13 % (ref 11.0–15.0)
WBC: 5.6 10*3/uL (ref 3.8–10.8)

## 2017-09-11 ENCOUNTER — Other Ambulatory Visit: Payer: Self-pay

## 2017-09-11 ENCOUNTER — Encounter: Payer: Self-pay | Admitting: Cardiovascular Disease

## 2017-09-11 ENCOUNTER — Ambulatory Visit (INDEPENDENT_AMBULATORY_CARE_PROVIDER_SITE_OTHER): Payer: Medicare Other | Admitting: Cardiovascular Disease

## 2017-09-11 ENCOUNTER — Other Ambulatory Visit (INDEPENDENT_AMBULATORY_CARE_PROVIDER_SITE_OTHER): Payer: Self-pay | Admitting: *Deleted

## 2017-09-11 VITALS — BP 139/74 | HR 60 | Ht 64.0 in | Wt 181.0 lb

## 2017-09-11 DIAGNOSIS — K922 Gastrointestinal hemorrhage, unspecified: Secondary | ICD-10-CM

## 2017-09-11 DIAGNOSIS — Z950 Presence of cardiac pacemaker: Secondary | ICD-10-CM

## 2017-09-11 DIAGNOSIS — I480 Paroxysmal atrial fibrillation: Secondary | ICD-10-CM | POA: Diagnosis not present

## 2017-09-11 DIAGNOSIS — I10 Essential (primary) hypertension: Secondary | ICD-10-CM

## 2017-09-11 NOTE — Patient Instructions (Signed)
Your physician recommends that you schedule a follow-up appointment in: AS NEEDED WITH DR [facility]  Your physician has recommended you make the following change in your medication:   STOP AMIODARONE  Thank you for choosing [facility]!!

## 2017-09-11 NOTE — Progress Notes (Signed)
SUBJECTIVE: The patient presents for follow-up of paroxysmal atrial fibrillation and hypertension.  She has a pacemaker for congenital heart block.  The patient denies any symptoms of chest pain, palpitations, shortness of breath, lightheadedness, dizziness, leg swelling, orthopnea, PND, and syncope.  She is excited as she is expecting to great-grandchildren.  She already has 11 grandchildren and 5 great-grandchildren.   Soc Hx: She has 4 daughters and 6 sons.   Review of Systems: As per "subjective", otherwise negative.  Allergies  Allergen Reactions  . Dihydrocodeine Other (See Comments)  . Other     Blisters after wear compression stockings    Current Outpatient Medications  Medication Sig Dispense Refill  . amiodarone (PACERONE) 200 MG tablet TAKE ONE TABLET BY MOUTH EVERY *OTHER* DAY 15 tablet 0  . benazepril (LOTENSIN) 40 MG tablet Take 1 tablet daily by mouth.    . citalopram (CELEXA) 20 MG tablet Take 1 tablet daily by mouth.    . diazepam (VALIUM) 5 MG tablet Take 1 tablet 2 (two) times daily by mouth.    [name] Calcium (STOOL SOFTENER PO) Take by mouth.    . furosemide (LASIX) 40 MG tablet Take 1 tablet daily by mouth.    Marland Kitchen gemfibrozil (LOPID) 600 MG tablet Take 1 tablet daily by mouth.    . Multiple Vitamin (MULTIVITAMIN) tablet Take 1 tablet by mouth daily.    . pantoprazole (PROTONIX) 20 MG tablet Take 1 tablet daily by mouth.    . rivaroxaban (XARELTO) 20 MG TABS tablet Take 1 tablet (20 mg total) by mouth daily with supper. 90 tablet 3  . ULORIC 40 MG tablet Take 1 tablet daily by mouth.    . verapamil (VERELAN PM) 360 MG 24 hr capsule Take 360 mg by mouth at bedtime.    . vitamin C (ASCORBIC ACID) 500 MG tablet Take 500 mg by mouth daily.    Marland Kitchen anastrozole (ARIMIDEX) 1 MG tablet TAKE ONE TABLET BY MOUTH DAILY 30 tablet 5  . Cranberry 400 MG CAPS Take 1 tablet daily by mouth.     No current facility-administered medications for this visit.     Past  Medical History:  Diagnosis Date  . Arthritis   . Breast cancer ([place])   . Cellulitis    Right leg  . CHF (congestive heart failure) ([place])   . Hyperlipidemia   . Hypertension   . Lobular carcinoma of right breast ([place]) 09/27/2014  . Malignant neoplasm of upper-outer quadrant of right female breast ([place]) 09/27/2014  . Paroxysmal atrial fibrillation (H[facility])   . Varicose veins    venous stasis skin changes    Past Surgical History:  Procedure Laterality Date  . ABDOMINAL HYSTERECTOMY    . CHOLECYSTECTOMY    . FOOT SURGERY     Multiple foot surgeries by Dr. Blanch Media  . GIVENS CAPSULE STUDY N/A 08/26/2017   Procedure: GIVENS CAPSULE STUDY;  Surgeon: Rogene Houston, MD;  Location: [facility];  Service: Endoscopy;  Laterality: N/A;  8:30  . JOINT REPLACEMENT     bilateral knee by Drs. McGee and Watson  . PACEMAKER IMPLANT Left 05/12/2013   MDT Sherril Croon PPM implanted by Dr Dot Lanes for CHB and SSS  . SHOULDER SURGERY      Social History   Socioeconomic History  . Marital status: Divorced    Spouse name: Not on file  . Number of children: Not on file  . Years of education: Not on file  .  Highest education level: Not on file  Occupational History  . Not on file  Social Needs  . Financial resource strain: Not on file  . Food insecurity:    Worry: Not on file    Inability: Not on file  . Transportation needs:    Medical: Not on file    Non-medical: Not on file  Tobacco Use  . Smoking status: Never Smoker  . Smokeless tobacco: Never Used  Substance and Sexual Activity  . Alcohol use: No  . Drug use: No  . Sexual activity: Not on file  Lifestyle  . Physical activity:    Days per week: Not on file    Minutes per session: Not on file  . Stress: Not on file  Relationships  . Social connections:    Talks on phone: Not on file    Gets together: Not on file    Attends religious service: Not on file    Active member of club or organization: Not on file    Attends meetings of  clubs or organizations: Not on file    Relationship status: Not on file  . Intimate partner violence:    Fear of current or ex partner: Not on file    Emotionally abused: Not on file    Physically abused: Not on file    Forced sexual activity: Not on file  Other Topics Concern  . Not on file  Social History Narrative   Lives in [place]   Divorced   Retired from [employer]:   09/11/17 1106  BP: 139/74  Pulse: 60  SpO2: 98%  Weight: 181 lb (82.1 kg)  Height: _0  (1.626 m)    Wt Readings from Last 3 Encounters:  09/11/17 181 lb (82.1 kg)  08/26/17 180 lb (81.6 kg)  05/27/17 185 lb 14.4 oz (84.3 kg)     PHYSICAL EXAM General: NAD HEENT: Normal. Neck: No JVD, no thyromegaly. Lungs: Clear to auscultation bilaterally with normal respiratory effort. CV: Regular rate and rhythm, normal S1/S2, no S3/S4, no murmur. No pretibial or periankle edema.     Abdomen: Soft, nontender, no distention.  Neurologic: Alert and oriented.  Psych: Normal affect. Skin: Normal. Musculoskeletal: No gross deformities.    ECG: Most recent ECG reviewed.   Labs: Lab Results  Component Value Date/Time   K 3.8 05/27/2017 10:05 AM   BUN 17 05/27/2017 10:05 AM   CREATININE 1.30 (H) 05/27/2017 10:05 AM   ALT 15 05/27/2017 10:05 AM   HGB 10.0 (L) 09/09/2017 08:27 AM     Lipids: No results found for: LDLCALC, LDLDIRECT, CHOL, TRIG, HDL     ASSESSMENT AND PLAN: 1. Paroxysmal atrial fibrillation: Symptomatically stable.  She is currently on amiodarone 100 mg daily and Hyson-acting verapamil 240 mg. CHADSVASC score is4. Continue Xarelto 20 mgdaily. I will stop amiodarone.  2. Cardiac pacemaker for congenital heart block: Device function is normal.  She is followed by Dr. Rayann Heman and in our device clinic.  3. Hypertension: Controlled.  No changes to therapy.      Disposition: Follow up with Dr. Rayann Heman as scheduled.  Follow-up with me as  needed.   Kate Sable, M.D., F.A.C.C.

## 2017-09-21 ENCOUNTER — Encounter (INDEPENDENT_AMBULATORY_CARE_PROVIDER_SITE_OTHER): Payer: Self-pay | Admitting: *Deleted

## 2017-09-21 ENCOUNTER — Other Ambulatory Visit (INDEPENDENT_AMBULATORY_CARE_PROVIDER_SITE_OTHER): Payer: Self-pay | Admitting: *Deleted

## 2017-09-21 DIAGNOSIS — K922 Gastrointestinal hemorrhage, unspecified: Secondary | ICD-10-CM

## 2017-09-22 DIAGNOSIS — J40 Bronchitis, not specified as acute or chronic: Secondary | ICD-10-CM | POA: Diagnosis not present

## 2017-10-09 DIAGNOSIS — K922 Gastrointestinal hemorrhage, unspecified: Secondary | ICD-10-CM | POA: Diagnosis not present

## 2017-10-10 LAB — CBC
HCT: 32.5 % — ABNORMAL LOW (ref 35.0–45.0)
Hemoglobin: 10.5 g/dL — ABNORMAL LOW (ref 11.7–15.5)
MCH: 27.9 pg (ref 27.0–33.0)
MCHC: 32.3 g/dL (ref 32.0–36.0)
MCV: 86.4 fL (ref 80.0–100.0)
MPV: 12 fL (ref 7.5–12.5)
Platelets: 256 10*3/uL (ref 140–400)
RBC: 3.76 10*6/uL — ABNORMAL LOW (ref 3.80–5.10)
RDW: 13 % (ref 11.0–15.0)
WBC: 6.3 10*3/uL (ref 3.8–10.8)

## 2017-10-13 ENCOUNTER — Other Ambulatory Visit (INDEPENDENT_AMBULATORY_CARE_PROVIDER_SITE_OTHER): Payer: Self-pay | Admitting: *Deleted

## 2017-10-13 DIAGNOSIS — K922 Gastrointestinal hemorrhage, unspecified: Secondary | ICD-10-CM

## 2017-10-27 ENCOUNTER — Encounter: Payer: Self-pay | Admitting: Cardiology

## 2017-10-29 DIAGNOSIS — E782 Mixed hyperlipidemia: Secondary | ICD-10-CM | POA: Diagnosis not present

## 2017-10-29 DIAGNOSIS — I10 Essential (primary) hypertension: Secondary | ICD-10-CM | POA: Diagnosis not present

## 2017-10-29 DIAGNOSIS — Z0000 Encounter for general adult medical examination without abnormal findings: Secondary | ICD-10-CM | POA: Diagnosis not present

## 2017-10-29 DIAGNOSIS — N182 Chronic kidney disease, stage 2 (mild): Secondary | ICD-10-CM | POA: Diagnosis not present

## 2017-10-29 DIAGNOSIS — Z1389 Encounter for screening for other disorder: Secondary | ICD-10-CM | POA: Diagnosis not present

## 2017-11-09 ENCOUNTER — Other Ambulatory Visit (HOSPITAL_COMMUNITY): Payer: Self-pay | Admitting: Oncology

## 2017-11-09 DIAGNOSIS — C50919 Malignant neoplasm of unspecified site of unspecified female breast: Secondary | ICD-10-CM

## 2017-11-11 ENCOUNTER — Other Ambulatory Visit (HOSPITAL_COMMUNITY): Payer: Self-pay | Admitting: *Deleted

## 2017-11-11 DIAGNOSIS — C50919 Malignant neoplasm of unspecified site of unspecified female breast: Secondary | ICD-10-CM

## 2017-11-11 MED ORDER — ANASTROZOLE 1 MG PO TABS: 1.0000 mg | ORAL_TABLET | Freq: Every day | ORAL | 5 refills | Status: DC

## 2017-11-11 NOTE — Progress Notes (Signed)
Chart reviewed, based on last office note, anastrozole refilled.

## 2017-11-12 DIAGNOSIS — L603 Nail dystrophy: Secondary | ICD-10-CM | POA: Diagnosis not present

## 2017-11-23 DIAGNOSIS — I10 Essential (primary) hypertension: Secondary | ICD-10-CM | POA: Diagnosis not present

## 2017-11-23 DIAGNOSIS — S8992XA Unspecified injury of left lower leg, initial encounter: Secondary | ICD-10-CM | POA: Diagnosis not present

## 2017-11-23 DIAGNOSIS — S81811A Laceration without foreign body, right lower leg, initial encounter: Secondary | ICD-10-CM | POA: Diagnosis not present

## 2017-11-23 DIAGNOSIS — W28XXXA Contact with powered lawn mower, initial encounter: Secondary | ICD-10-CM | POA: Diagnosis not present

## 2017-11-23 DIAGNOSIS — Z950 Presence of cardiac pacemaker: Secondary | ICD-10-CM | POA: Diagnosis not present

## 2017-11-23 DIAGNOSIS — S8011XA Contusion of right lower leg, initial encounter: Secondary | ICD-10-CM | POA: Diagnosis not present

## 2017-11-23 DIAGNOSIS — T148XXA Other injury of unspecified body region, initial encounter: Secondary | ICD-10-CM | POA: Diagnosis not present

## 2017-11-24 ENCOUNTER — Ambulatory Visit (HOSPITAL_COMMUNITY): Payer: Medicare Other

## 2017-11-24 ENCOUNTER — Ambulatory Visit (HOSPITAL_COMMUNITY): Payer: Medicare Other | Admitting: Hematology

## 2017-11-27 ENCOUNTER — Encounter: Payer: Self-pay | Admitting: Cardiology

## 2017-12-04 DIAGNOSIS — S81811D Laceration without foreign body, right lower leg, subsequent encounter: Secondary | ICD-10-CM | POA: Diagnosis not present

## 2017-12-04 DIAGNOSIS — Z4802 Encounter for removal of sutures: Secondary | ICD-10-CM | POA: Diagnosis not present

## 2017-12-08 ENCOUNTER — Ambulatory Visit (INDEPENDENT_AMBULATORY_CARE_PROVIDER_SITE_OTHER): Payer: Medicare Other | Admitting: *Deleted

## 2017-12-08 DIAGNOSIS — I442 Atrioventricular block, complete: Secondary | ICD-10-CM

## 2017-12-09 NOTE — Progress Notes (Signed)
Remote pacemaker transmission.   

## 2017-12-14 ENCOUNTER — Encounter (HOSPITAL_COMMUNITY): Payer: Self-pay | Admitting: Hematology

## 2017-12-14 ENCOUNTER — Inpatient Hospital Stay (HOSPITAL_COMMUNITY): Payer: Medicare Other | Attending: Hematology | Admitting: Hematology

## 2017-12-14 ENCOUNTER — Inpatient Hospital Stay (HOSPITAL_COMMUNITY): Payer: Medicare Other

## 2017-12-14 VITALS — BP 126/54 | HR 65 | Temp 98.1°F | Resp 16 | Wt 177.0 lb

## 2017-12-14 DIAGNOSIS — D509 Iron deficiency anemia, unspecified: Secondary | ICD-10-CM | POA: Insufficient documentation

## 2017-12-14 DIAGNOSIS — I130 Hypertensive heart and chronic kidney disease with heart failure and stage 1 through stage 4 chronic kidney disease, or unspecified chronic kidney disease: Secondary | ICD-10-CM | POA: Diagnosis not present

## 2017-12-14 DIAGNOSIS — I480 Paroxysmal atrial fibrillation: Secondary | ICD-10-CM

## 2017-12-14 DIAGNOSIS — Z79899 Other long term (current) drug therapy: Secondary | ICD-10-CM | POA: Insufficient documentation

## 2017-12-14 DIAGNOSIS — C50411 Malignant neoplasm of upper-outer quadrant of right female breast: Secondary | ICD-10-CM | POA: Diagnosis present

## 2017-12-14 DIAGNOSIS — C50911 Malignant neoplasm of unspecified site of right female breast: Secondary | ICD-10-CM

## 2017-12-14 DIAGNOSIS — E785 Hyperlipidemia, unspecified: Secondary | ICD-10-CM | POA: Insufficient documentation

## 2017-12-14 DIAGNOSIS — I509 Heart failure, unspecified: Secondary | ICD-10-CM | POA: Insufficient documentation

## 2017-12-14 DIAGNOSIS — M1990 Unspecified osteoarthritis, unspecified site: Secondary | ICD-10-CM | POA: Diagnosis not present

## 2017-12-14 DIAGNOSIS — M8580 Other specified disorders of bone density and structure, unspecified site: Secondary | ICD-10-CM | POA: Diagnosis not present

## 2017-12-14 DIAGNOSIS — R5383 Other fatigue: Secondary | ICD-10-CM | POA: Insufficient documentation

## 2017-12-14 DIAGNOSIS — Z170 Estrogen receptor positive status [ER+]: Secondary | ICD-10-CM | POA: Insufficient documentation

## 2017-12-14 DIAGNOSIS — Z79811 Long term (current) use of aromatase inhibitors: Secondary | ICD-10-CM | POA: Diagnosis not present

## 2017-12-14 DIAGNOSIS — D638 Anemia in other chronic diseases classified elsewhere: Secondary | ICD-10-CM

## 2017-12-14 LAB — COMPREHENSIVE METABOLIC PANEL
ALT: 11 U/L (ref 0–44)
AST: 16 U/L (ref 15–41)
Albumin: 3.7 g/dL (ref 3.5–5.0)
Alkaline Phosphatase: 100 U/L (ref 38–126)
Anion gap: 9 (ref 5–15)
BUN: 21 mg/dL (ref 8–23)
CO2: 29 mmol/L (ref 22–32)
Calcium: 9.3 mg/dL (ref 8.9–10.3)
Chloride: 105 mmol/L (ref 98–111)
Creatinine, Ser: 1.17 mg/dL — ABNORMAL HIGH (ref 0.44–1.00)
GFR calc Af Amer: 50 mL/min — ABNORMAL LOW (ref >60)
GFR calc non Af Amer: 43 mL/min — ABNORMAL LOW (ref >60)
Glucose, Bld: 106 mg/dL — ABNORMAL HIGH (ref 70–99)
Potassium: 4.1 mmol/L (ref 3.5–5.1)
Sodium: 143 mmol/L (ref 135–145)
Total Bilirubin: 0.4 mg/dL (ref 0.3–1.2)
Total Protein: 7.4 g/dL (ref 6.5–8.1)

## 2017-12-14 LAB — CBC WITH DIFFERENTIAL/PLATELET
Basophils Absolute: 0.1 10*3/uL (ref 0.0–0.1)
Basophils Relative: 1 %
Eosinophils Absolute: 0.4 10*3/uL (ref 0.0–0.7)
Eosinophils Relative: 5 %
HCT: 34.9 % — ABNORMAL LOW (ref 36.0–46.0)
Hemoglobin: 10.5 g/dL — ABNORMAL LOW (ref 12.0–15.0)
Lymphocytes Relative: 24 %
Lymphs Abs: 1.7 10*3/uL (ref 0.7–4.0)
MCH: 27.9 pg (ref 26.0–34.0)
MCHC: 30.1 g/dL (ref 30.0–36.0)
MCV: 92.8 fL (ref 78.0–100.0)
Monocytes Absolute: 0.6 10*3/uL (ref 0.1–1.0)
Monocytes Relative: 9 %
Neutro Abs: 4.3 10*3/uL (ref 1.7–7.7)
Neutrophils Relative %: 61 %
Platelets: 334 10*3/uL (ref 150–400)
RBC: 3.76 MIL/uL — ABNORMAL LOW (ref 3.87–5.11)
RDW: 14.8 % (ref 11.5–15.5)
WBC: 7.1 10*3/uL (ref 4.0–10.5)

## 2017-12-14 LAB — IRON AND TIBC
Iron: 35 ug/dL (ref 28–170)
Saturation Ratios: 9 % — ABNORMAL LOW (ref 10.4–31.8)
TIBC: 396 ug/dL (ref 250–450)
UIBC: 361 ug/dL

## 2017-12-14 LAB — FERRITIN: Ferritin: 44 ng/mL (ref 11–307)

## 2017-12-14 NOTE — Progress Notes (Signed)
Alicia Nash, [place] [address]   CLINIC:  Medical Oncology/Hematology  PCP:  [name], MD [place] [id number] [phone]   REASON FOR VISIT:  Follow-up for right breast cancer stage IA, ER+/PR+/HER2- and anemia  CURRENT THERAPY: Arimidex daily  BRIEF ONCOLOGIC HISTORY:    Lobular carcinoma of right breast ([place])   08/30/2014 Mammogram    Ill-defined shadowing mass at 10:00 position 6 cm from the nipple measuring [phone] cm in size without lymphadenopathy seen in the right axilla.    09/06/2014 Procedure    Biopsy of right breast mass at 10 o'clock position.    09/08/2014 Pathology Results    Invasive carcinoma, favor invasive ductal carcinoma.  Some features are suggestive of lobular carcinoma.  Negative e-cadherin immunostaining throughout the lesional cells supports the diagnosis of invasive lobular carcinoma.    09/08/2014 Pathology Results    ER 90%, PR 90%. HER2 FISH negative, Ki-67 26%.    09/20/2014 Definitive Surgery    Lumpectomy    09/20/2014 Pathology Results    Invasive lobular carcinoma, 0.6 cm in maximal dimension, closest margin is 0.3 cm at posterior resection margin. Grade 2. No LV I. 0/1 lymph nodes.    09/27/2014 -  Anti-estrogen oral therapy    Arimidex    08/08/2015 Mammogram    BIRADS 2    09/20/2015 Procedure    Right breast lumpectomy by Dr. Anthony Sar with sentinel lymph node biopsy.      CANCER STAGING: Cancer Staging Lobular carcinoma of right breast [facility]) Staging form: Breast, AJCC 7th Edition - Pathologic stage from 09/20/2014: Stage IA (T1b, N0, cM0) - Signed by Baird Cancer, PA-C on 01/15/2016    INTERVAL HISTORY:  Alicia Nash 77 y.o. female returns for routine follow-up for right breast cancer and anemia. Patient is easily fatigued during the day. Her last blood transfusion was in March. Her last iron infusion was in February of this year. She lives at home alone and performs  all her own ADLs. Patient denies any fevers or recent infections. Denies any headaches or vision changes. Denies any bleeding or blood in her stool. She is taking her Arimidex daily as prescribed with no issues. She has occasional hot flash and aches in her knees but this is all stable.    REVIEW OF SYSTEMS:  Review of Systems  Constitutional: Positive for fatigue.  Hematological: Bruises/bleeds easily.  All other systems reviewed and are negative.    PAST MEDICAL/SURGICAL HISTORY:  Past Medical History:  Diagnosis Date  . Arthritis   . Breast cancer ([place])   . Cellulitis    Right leg  . CHF (congestive heart failure) ([place])   . Hyperlipidemia   . Hypertension   . Lobular carcinoma of right breast ([place]) 09/27/2014  . Malignant neoplasm of upper-outer quadrant of right female breast ([place]) 09/27/2014  . Paroxysmal atrial fibrillation ([facility])   . Varicose veins    venous stasis skin changes   Past Surgical History:  Procedure Laterality Date  . ABDOMINAL HYSTERECTOMY    . CHOLECYSTECTOMY    . FOOT SURGERY     Multiple foot surgeries by Dr. Blanch Media  . GIVENS CAPSULE STUDY N/A 08/26/2017   Procedure: GIVENS CAPSULE STUDY;  Surgeon: Rogene Houston, MD;  Location[facility];  Service: Endoscopy;  Laterality: N/A;  8:30  . JOINT REPLACEMENT     bilateral knee by Drs. McGee and Gore  . PACEMAKER IMPLANT  Left 05/12/2013   MDT Sherril Croon PPM implanted by Dr Dot Lanes for CHB and SSS  . SHOULDER SURGERY       SOCIAL HISTORY:  Social History   Socioeconomic History  . Marital status: Divorced    Spouse name: Not on file  . Number of children: Not on file  . Years of education: Not on file  . Highest education level: Not on file  Occupational History  . Not on file  Social Needs  . Financial resource strain: Not on file  . Food insecurity:    Worry: Not on file    Inability: Not on file  . Transportation needs:    Medical: Not on file    Non-medical: Not on file  Tobacco Use    . Smoking status: Never Smoker  . Smokeless tobacco: Never Used  Substance and Sexual Activity  . Alcohol use: No  . Drug use: No  . Sexual activity: Not on file  Lifestyle  . Physical activity:    Days per week: Not on file    Minutes per session: Not on file  . Stress: Not on file  Relationships  . Social connections:    Talks on phone: Not on file    Gets together: Not on file    Attends religious service: Not on file    Active member of club or organization: Not on file    Attends meetings of clubs or organizations: Not on file    Relationship status: Not on file  . Intimate partner violence:    Fear of current or ex partner: Not on file    Emotionally abused: Not on file    Physically abused: Not on file    Forced sexual activity: Not on file  Other Topics Concern  . Not on file  Social History Narrative   Lives in [place]   Divorced   Retired from [employer] HISTORY:  Family History  Problem Relation Age of Onset  . Hypertension Other   . Diabetes Mother     CURRENT MEDICATIONS:  Outpatient Encounter Medications as of 12/14/2017  Medication Sig Note  . anastrozole (ARIMIDEX) 1 MG tablet Take 1 tablet (1 mg total) by mouth daily.   . benazepril (LOTENSIN) 40 MG tablet Take 1 tablet daily by mouth. 01/15/2016: Received from: External Pharmacy  . citalopram (CELEXA) 20 MG tablet Take 1 tablet daily by mouth. 01/15/2016: Received from: External Pharmacy  . Cranberry 400 MG CAPS Take 1 tablet daily by mouth.   . diazepam (VALIUM) 5 MG tablet Take 1 tablet 2 (two) times daily by mouth. 01/15/2016: Received from: External Pharmacy  . Docusate Calcium (STOOL SOFTENER PO) Take by mouth.   . furosemide (LASIX) 40 MG tablet Take 1 tablet daily by mouth. 01/15/2016: Received from: External Pharmacy  . gemfibrozil (LOPID) 600 MG tablet Take 1 tablet daily by mouth. 01/15/2016: Received from: External Pharmacy  . Multiple Vitamin (MULTIVITAMIN)  tablet Take 1 tablet by mouth daily.   . pantoprazole (PROTONIX) 20 MG tablet Take 1 tablet daily by mouth. 01/15/2016: Received from: External Pharmacy  . rivaroxaban (XARELTO) 20 MG TABS tablet Take 1 tablet (20 mg total) by mouth daily with supper.   Marland Kitchen ULORIC 40 MG tablet Take 1 tablet daily by mouth. 01/15/2016: Received from: External Pharmacy  . verapamil (VERELAN PM) 360 MG 24 hr capsule Take 360 mg by mouth at bedtime.   . vitamin C (ASCORBIC ACID) 500 MG tablet  Take 500 mg by mouth daily.    No facility-administered encounter medications on file as of 12/14/2017.     ALLERGIES:  Allergies  Allergen Reactions  . Dihydrocodeine Other (See Comments)  . Other     Blisters after wear compression stockings     PHYSICAL EXAM:  ECOG Performance status: 1  Vitals:   12/14/17 1410  BP: (!) 126/54  Pulse: 65  Resp: 16  Temp: 98.1 F (36.7 C)  SpO2: 98%   Filed Weights   12/14/17 1410  Weight: 177 lb (80.3 kg)    Physical Exam HEENT: No thrush or mucositis. Chest: Bilateral clear to auscultation. CVS: S1-S2 regular rate and rhythm. Breast exam: Right breast upper outer quadrant lumpectomy scar is well-healed.  No palpable mass in bilateral breast. Abdomen: No palpable masses. Extremities: No edema or cyanosis.  LABORATORY DATA:  I have reviewed the labs as listed.  CBC    Component Value Date/Time   WBC 7.1 12/14/2017 1501   RBC 3.76 (L) 12/14/2017 1501   HGB 10.5 (L) 12/14/2017 1501   HCT 34.9 (L) 12/14/2017 1501   PLT 334 12/14/2017 1501   MCV 92.8 12/14/2017 1501   MCH 27.9 12/14/2017 1501   MCHC 30.1 12/14/2017 1501   RDW 14.8 12/14/2017 1501   LYMPHSABS 1.7 12/14/2017 1501   MONOABS 0.6 12/14/2017 1501   EOSABS 0.4 12/14/2017 1501   BASOSABS 0.1 12/14/2017 1501   CMP Latest Ref Rng & Units 12/14/2017 05/27/2017 11/19/2016  Glucose 70 - 99 mg/dL 106(H) 122(H) 101(H)  BUN 8 - 23 mg/dL 21 17 23(H)  Creatinine 0.44 - 1.00 mg/dL 1.17(H) 1.30(H) 1.55(H)    Sodium 135 - 145 mmol/L 143 140 142  Potassium 3.5 - 5.1 mmol/L 4.1 3.8 4.3  Chloride 98 - 111 mmol/L 105 103 105  CO2 22 - 32 mmol/L _0 Calcium 8.9 - 10.3 mg/dL 9.3 8.9 9.1  Total Protein 6.5 - 8.1 g/dL 7.4 6.7 6.9  Total Bilirubin 0.3 - 1.2 mg/dL 0.4 0.5 0.5  Alkaline Phos 38 - 126 U/L 100 88 81  AST 15 - 41 U/L _1 ALT 0 - 44 U/L _2 DIAGNOSTIC IMAGING:  I have personally reviewed mammogram dated 08/18/2017 and discussed with the patient.     ASSESSMENT & PLAN:   Lobular carcinoma of right breast (HCC) 1.  Stage I (PT 1 BP N0) right breast lobular carcinoma: -Status post lumpectomy on 09/20/2014, 0.6 cm, ER/PR positive, HER-2 negative by FISH, Ki-67 of 26%, Arimidex started on 09/27/2014.  For some reason she did not receive radiation therapy. - She is tolerating anastrozole without any major hot flashes or musculoskeletal symptoms.  Today physical examination was benign. -Mammogram on 08/18/2017 reviewed by me shows BI-RADS Category 2. - She will be seen back in 4 months for follow-up.  2.  Normocytic anemia: -This is from combination of CKD and iron deficiency state. -She reportedly received 4 units of PRBC in March at [facility].  She had colonoscopy on 07/14/2017 at [facility] which was normal. - A capsule study on 08/26/2017 by Dr. Laural Golden showed few small bowel punctate AVM with speck of blood, no active bleeding. -Last Feraheme infusion was on 06/25/2017.  Denies any bleeding per rectum or melena. - I reviewed the CBC from today which showed hemoglobin of 10.5 and MCV of 86. - She is on Xarelto for paroxysmal atrial fibrillation. -We have have checked her  ferritin and iron panel today.  Based on the levels, we will recommend further Feraheme infusions.  3.  Osteopenia: - DEXA scan on 08/13/2015 shows T score of -1.7 in the left femoral neck.  She is continuing calcium and vitamin D twice daily. -We will repeat DEXA scan prior to next  visit.      Orders placed this encounter:  Orders Placed This Encounter  Procedures  . DG Bone Density  . CBC with Differential/Platelet  . Comprehensive metabolic panel  . Ferritin  . Iron and TIBC  . CBC with Differential/Platelet  . Comprehensive metabolic panel  . Ferritin  . Iron and TIBC  . Vitamin B12  . Folate      Derek Jack, MD [place] [phone]

## 2017-12-14 NOTE — Assessment & Plan Note (Signed)
1.  Stage I (PT 1 BP N0) right breast lobular carcinoma: -Status post lumpectomy on 09/20/2014, 0.6 cm, ER/PR positive, HER-2 negative by FISH, Ki-67 of 26%, Arimidex started on 09/27/2014.  For some reason she did not receive radiation therapy. - She is tolerating anastrozole without any major hot flashes or musculoskeletal symptoms.  Today physical examination was benign. -Mammogram on 08/18/2017 reviewed by me shows BI-RADS Category 2. - She will be seen back in 4 months for follow-up.  2.  Normocytic anemia: -This is from combination of CKD and iron deficiency state. -She reportedly received 4 units of PRBC in March at [facility].  She had colonoscopy on 07/14/2017 at [facility] which was normal. - A capsule study on 08/26/2017 by Dr. Laural Golden showed few small bowel punctate AVM with speck of blood, no active bleeding. -Last Feraheme infusion was on 06/25/2017.  Denies any bleeding per rectum or melena. - I reviewed the CBC from today which showed hemoglobin of 10.5 and MCV of 86. - She is on Xarelto for paroxysmal atrial fibrillation. -We have have checked her ferritin and iron panel today.  Based on the levels, we will recommend further Feraheme infusions.  3.  Osteopenia: - DEXA scan on 08/13/2015 shows T score of -1.7 in the left femoral neck.  She is continuing calcium and vitamin D twice daily. -We will repeat DEXA scan prior to next visit.

## 2017-12-14 NOTE — Patient Instructions (Signed)
[place] at [facility] Discharge Instructions  You saw Dr. Delton Coombes today.   Thank you for choosing [place] at [facility] to provide your oncology and hematology care.  To afford each patient quality time with our provider, please arrive at least 15 minutes before your scheduled appointment time.   If you have a lab appointment with the [facility] please come in thru the  Main Entrance and check in at the main information desk  You need to re-schedule your appointment should you arrive 10 or more minutes late.  We strive to give you quality time with our providers, and arriving late affects you and other patients whose appointments are after yours.  Also, if you no show three or more times for appointments you may be dismissed from the clinic at the providers discretion.     Again, thank you for choosing [facility].  Our hope is that these requests will decrease the amount of time that you wait before being seen by our physicians.       _____________________________________________________________  Should you have questions after your visit to [facility], please contact our office at [phone] between the hours of 8:00 a.m. and 4:30 p.m.  Voicemails left after 4:00 p.m. will not be returned until the following business day.  For prescription refill requests, have your pharmacy contact our office and allow 72 hours.    Cancer Center Support Programs:   > Cancer Support Group  2nd Tuesday of the month 1pm-2pm, Journey Room

## 2017-12-23 ENCOUNTER — Inpatient Hospital Stay (HOSPITAL_COMMUNITY): Payer: Medicare Other

## 2017-12-23 ENCOUNTER — Encounter (HOSPITAL_COMMUNITY): Payer: Self-pay

## 2017-12-23 VITALS — BP 136/54 | HR 60 | Temp 98.5°F | Resp 18

## 2017-12-23 DIAGNOSIS — M8580 Other specified disorders of bone density and structure, unspecified site: Secondary | ICD-10-CM | POA: Diagnosis not present

## 2017-12-23 DIAGNOSIS — R5383 Other fatigue: Secondary | ICD-10-CM | POA: Diagnosis not present

## 2017-12-23 DIAGNOSIS — I130 Hypertensive heart and chronic kidney disease with heart failure and stage 1 through stage 4 chronic kidney disease, or unspecified chronic kidney disease: Secondary | ICD-10-CM | POA: Diagnosis not present

## 2017-12-23 DIAGNOSIS — C50411 Malignant neoplasm of upper-outer quadrant of right female breast: Secondary | ICD-10-CM | POA: Diagnosis not present

## 2017-12-23 DIAGNOSIS — D509 Iron deficiency anemia, unspecified: Secondary | ICD-10-CM | POA: Diagnosis not present

## 2017-12-23 DIAGNOSIS — Z79899 Other long term (current) drug therapy: Secondary | ICD-10-CM | POA: Diagnosis not present

## 2017-12-23 DIAGNOSIS — M1990 Unspecified osteoarthritis, unspecified site: Secondary | ICD-10-CM | POA: Diagnosis not present

## 2017-12-23 DIAGNOSIS — Z170 Estrogen receptor positive status [ER+]: Secondary | ICD-10-CM | POA: Diagnosis not present

## 2017-12-23 DIAGNOSIS — E785 Hyperlipidemia, unspecified: Secondary | ICD-10-CM | POA: Diagnosis not present

## 2017-12-23 DIAGNOSIS — I509 Heart failure, unspecified: Secondary | ICD-10-CM | POA: Diagnosis not present

## 2017-12-23 DIAGNOSIS — Z79811 Long term (current) use of aromatase inhibitors: Secondary | ICD-10-CM | POA: Diagnosis not present

## 2017-12-23 DIAGNOSIS — I480 Paroxysmal atrial fibrillation: Secondary | ICD-10-CM | POA: Diagnosis not present

## 2017-12-23 DIAGNOSIS — D508 Other iron deficiency anemias: Secondary | ICD-10-CM

## 2017-12-23 MED ORDER — SODIUM CHLORIDE 0.9 % IV SOLN
Freq: Once | INTRAVENOUS | Status: AC
  Administered 2017-12-23: 14:00:00 via INTRAVENOUS

## 2017-12-23 MED ORDER — SODIUM CHLORIDE 0.9 % IV SOLN
510.0000 mg | Freq: Once | INTRAVENOUS | Status: AC
  Administered 2017-12-23: 510 mg via INTRAVENOUS
  Filled 2017-12-23: qty 17

## 2017-12-23 NOTE — Progress Notes (Signed)
Alicia Nash tolerated Feraheme infusion well without complaints or incident. VSS upon discharge. Pt discharged self ambulatory in satisfactory condition

## 2017-12-23 NOTE — Patient Instructions (Signed)
[facility] at [facility] Discharge Instructions  Received Feraheme infusion today. Follow-up as scheduled. Call clinic for any questions or concerns   Thank you for choosing [facility] at [facility] to provide your oncology and hematology care.  To afford each patient quality time with our provider, please arrive at least 15 minutes before your scheduled appointment time.   If you have a lab appointment with the Cancer Center please come in thru the  Main Entrance and check in at the main information desk  You need to re-schedule your appointment should you arrive 10 or more minutes late.  We strive to give you quality time with our providers, and arriving late affects you and other patients whose appointments are after yours.  Also, if you no show three or more times for appointments you may be dismissed from the clinic at the providers discretion.     Again, thank you for choosing [facility].  Our hope is that these requests will decrease the amount of time that you wait before being seen by our physicians.       _____________________________________________________________  Should you have questions after your visit to  Cancer Center, please contact our office at [phone] between the hours of 8:00 a.m. and 4:30 p.m.  Voicemails left after 4:00 p.m. will not be returned until the following business day.  For prescription refill requests, have your pharmacy contact our office and allow 72 hours.    Cancer Center Support Programs:   > Cancer Support Group  2nd Tuesday of the month 1pm-2pm, Journey Room   

## 2017-12-29 ENCOUNTER — Other Ambulatory Visit (HOSPITAL_COMMUNITY): Payer: Self-pay | Admitting: Hematology

## 2017-12-29 LAB — CUP PACEART REMOTE DEVICE CHECK
Battery Impedance: 731 Ohm
Battery Remaining Longevity: 62 mo
Battery Voltage: 2.78 V
Brady Statistic AP VP Percent: 21 %
Brady Statistic AP VS Percent: 0 %
Brady Statistic AS VP Percent: 79 %
Brady Statistic AS VS Percent: 0 %
Date Time Interrogation Session: 20190803000652
Implantable Lead Implant Date: 20150108
Implantable Lead Implant Date: 20150108
Implantable Lead Location: 753859
Implantable Lead Location: 753860
Implantable Lead Model: 5076
Implantable Lead Model: 5076
Implantable Pulse Generator Implant Date: 20150108
Lead Channel Impedance Value: 395 Ohm
Lead Channel Impedance Value: 434 Ohm
Lead Channel Pacing Threshold Amplitude: 0.625 V
Lead Channel Pacing Threshold Amplitude: 0.875 V
Lead Channel Pacing Threshold Pulse Width: 0.4 ms
Lead Channel Pacing Threshold Pulse Width: 0.4 ms
Lead Channel Setting Pacing Amplitude: 2 V
Lead Channel Setting Pacing Amplitude: 2.5 V
Lead Channel Setting Pacing Pulse Width: 0.4 ms
Lead Channel Setting Sensing Sensitivity: 2 mV

## 2017-12-30 ENCOUNTER — Inpatient Hospital Stay (HOSPITAL_COMMUNITY): Payer: Medicare Other

## 2017-12-30 ENCOUNTER — Encounter (HOSPITAL_COMMUNITY): Payer: Self-pay

## 2017-12-30 VITALS — BP 138/52 | HR 54 | Temp 98.2°F | Resp 18

## 2017-12-30 DIAGNOSIS — Z79899 Other long term (current) drug therapy: Secondary | ICD-10-CM | POA: Diagnosis not present

## 2017-12-30 DIAGNOSIS — D508 Other iron deficiency anemias: Secondary | ICD-10-CM

## 2017-12-30 DIAGNOSIS — R5383 Other fatigue: Secondary | ICD-10-CM | POA: Diagnosis not present

## 2017-12-30 DIAGNOSIS — Z79811 Long term (current) use of aromatase inhibitors: Secondary | ICD-10-CM | POA: Diagnosis not present

## 2017-12-30 DIAGNOSIS — D509 Iron deficiency anemia, unspecified: Secondary | ICD-10-CM | POA: Diagnosis not present

## 2017-12-30 DIAGNOSIS — M1990 Unspecified osteoarthritis, unspecified site: Secondary | ICD-10-CM | POA: Diagnosis not present

## 2017-12-30 DIAGNOSIS — M8580 Other specified disorders of bone density and structure, unspecified site: Secondary | ICD-10-CM | POA: Diagnosis not present

## 2017-12-30 DIAGNOSIS — I130 Hypertensive heart and chronic kidney disease with heart failure and stage 1 through stage 4 chronic kidney disease, or unspecified chronic kidney disease: Secondary | ICD-10-CM | POA: Diagnosis not present

## 2017-12-30 DIAGNOSIS — Z170 Estrogen receptor positive status [ER+]: Secondary | ICD-10-CM | POA: Diagnosis not present

## 2017-12-30 DIAGNOSIS — I480 Paroxysmal atrial fibrillation: Secondary | ICD-10-CM | POA: Diagnosis not present

## 2017-12-30 DIAGNOSIS — I509 Heart failure, unspecified: Secondary | ICD-10-CM | POA: Diagnosis not present

## 2017-12-30 DIAGNOSIS — E785 Hyperlipidemia, unspecified: Secondary | ICD-10-CM | POA: Diagnosis not present

## 2017-12-30 DIAGNOSIS — C50411 Malignant neoplasm of upper-outer quadrant of right female breast: Secondary | ICD-10-CM | POA: Diagnosis not present

## 2017-12-30 MED ORDER — SODIUM CHLORIDE 0.9 % IV SOLN
510.0000 mg | Freq: Once | INTRAVENOUS | Status: AC
  Administered 2017-12-30: 510 mg via INTRAVENOUS
  Filled 2017-12-30: qty 17

## 2017-12-30 MED ORDER — SODIUM CHLORIDE 0.9% FLUSH
10.0000 mL | INTRAVENOUS | Status: DC | PRN
  Administered 2017-12-30: 10 mL
  Filled 2017-12-30: qty 10

## 2017-12-30 MED ORDER — SODIUM CHLORIDE 0.9 % IV SOLN
INTRAVENOUS | Status: DC
  Administered 2017-12-30: 14:00:00 via INTRAVENOUS

## 2017-12-30 NOTE — Patient Instructions (Signed)
[facility] at [facility]  Discharge Instructions:  You received an iron infusion today.  _______________________________________________________________  Thank you for choosing [facility] at [facility] to provide your oncology and hematology care.  To afford each patient quality time with our providers, please arrive at least 15 minutes before your scheduled appointment.  You need to re-schedule your appointment if you arrive 10 or more minutes late.  We strive to give you quality time with our providers, and arriving late affects you and other patients whose appointments are after yours.  Also, if you no show three or more times for appointments you may be dismissed from the clinic.  Again, thank you for choosing [facility] at [facility]. Our hope is that these requests will allow you access to exceptional care and in a timely manner. _______________________________________________________________  If you have questions after your visit, please contact our office at [phone] between the hours of 8:30 a.m. and 5:00 p.m. Voicemails left after 4:30 p.m. will not be returned until the following business day. _______________________________________________________________  For prescription refill requests, have your pharmacy contact our office. _______________________________________________________________  Recommendations made by the consultant and any test results will be sent to your referring physician. _______________________________________________________________ 

## 2017-12-30 NOTE — Progress Notes (Signed)
Patient tolerated iron infusion with no complaints voiced.  Good blood return noted before and after administration of therapy.  No swelling or bruising noted at site.  Band aid applied.  VSS with discharge and left ambulatory with no s/s of distress noted.

## 2018-01-08 ENCOUNTER — Ambulatory Visit (INDEPENDENT_AMBULATORY_CARE_PROVIDER_SITE_OTHER): Payer: Medicare Other | Admitting: Internal Medicine

## 2018-01-08 ENCOUNTER — Encounter: Payer: Self-pay | Admitting: Internal Medicine

## 2018-01-08 VITALS — BP 120/56 | HR 69 | Ht 64.0 in | Wt 179.4 lb

## 2018-01-08 DIAGNOSIS — I480 Paroxysmal atrial fibrillation: Secondary | ICD-10-CM | POA: Diagnosis not present

## 2018-01-08 DIAGNOSIS — I10 Essential (primary) hypertension: Secondary | ICD-10-CM

## 2018-01-08 DIAGNOSIS — I442 Atrioventricular block, complete: Secondary | ICD-10-CM | POA: Diagnosis not present

## 2018-01-08 NOTE — Patient Instructions (Signed)
Medication Instructions:  Continue all current medications.  Labwork: none  Testing/Procedures: none  Follow-Up: Your physician wants you to follow up in:  1 year.  You will receive a reminder letter in the mail one-two months in advance.  If you don't receive a letter, please call our office to schedule the follow up appointment   Any Other Special Instructions Will Be Listed Below (If Applicable). Remote monitoring is used to monitor your Pacemaker of ICD from home. This monitoring reduces the number of office visits required to check your device to one time per year. It allows [place] to keep an eye on the functioning of your device to ensure it is working properly. You are scheduled for a device check from home on 03-09-2018. You may send your transmission at any time that day. If you have a wireless device, the transmission will be sent automatically. After your physician reviews your transmission, you will receive a postcard with your next transmission date.  If you need a refill on your cardiac medications before your next appointment, please call your pharmacy.

## 2018-01-08 NOTE — Progress Notes (Signed)
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 female who presents today for routine electrophysiology followup.  Since last being seen in our clinic, the patient reports doing very well.  Today, she denies symptoms of palpitations, chest pain, shortness of breath,  lower extremity edema, dizziness, presyncope, or syncope.  The patient is otherwise without complaint today.   Past Medical History:  Diagnosis Date  . Arthritis   . Breast cancer ([place])   . Cellulitis    Right leg  . CHF (congestive heart failure) ([place])   . Hyperlipidemia   . Hypertension   . Lobular carcinoma of right breast ([place]) 09/27/2014  . Malignant neoplasm of upper-outer quadrant of right female breast ([place]) 09/27/2014  . Paroxysmal atrial fibrillation (HCC)   . Varicose veins    venous stasis skin changes   Past Surgical History:  Procedure Laterality Date  . ABDOMINAL HYSTERECTOMY    . CHOLECYSTECTOMY    . FOOT SURGERY     Multiple foot surgeries by Dr. Blanch Nash  . GIVENS CAPSULE STUDY N/A 08/26/2017   Procedure: GIVENS CAPSULE STUDY;  Surgeon: Alicia Houston, MD;  Location[facility];  Service: Endoscopy;  Laterality: N/A;  8:30  . JOINT REPLACEMENT     bilateral knee by Drs. Alicia Nash and Alicia Nash  . PACEMAKER IMPLANT Left 05/12/2013   MDT Alicia Nash PPM implanted by Dr Alicia Nash for CHB and SSS  . SHOULDER SURGERY      ROS- all systems are reviewed and negative except as per HPI above  Current Outpatient Medications  Medication Sig Dispense Refill  . anastrozole (ARIMIDEX) 1 MG tablet Take 1 tablet (1 mg total) by mouth daily. 30 tablet 5  . benazepril (LOTENSIN) 40 MG tablet Take 1 tablet daily by mouth.    . citalopram (CELEXA) 20 MG tablet Take 1 tablet daily by mouth.    . Cranberry 400 MG CAPS Take 1 tablet daily by mouth.    . diazepam (VALIUM) 5 MG tablet Take 1 tablet 2 (two) times daily by mouth.    Alicia Nash Calcium (STOOL  SOFTENER PO) Take by mouth.    . furosemide (LASIX) 40 MG tablet Take 1 tablet daily by mouth.    Marland Kitchen gemfibrozil (LOPID) 600 MG tablet Take 1 tablet daily by mouth.    . Multiple Vitamin (MULTIVITAMIN) tablet Take 1 tablet by mouth daily.    . pantoprazole (PROTONIX) 20 MG tablet Take 1 tablet daily by mouth.    . rivaroxaban (XARELTO) 20 MG TABS tablet Take 1 tablet (20 mg total) by mouth daily with supper. 90 tablet 3  . ULORIC 40 MG tablet Take 1 tablet daily by mouth.    . verapamil (VERELAN PM) 360 MG 24 hr capsule Take 360 mg by mouth at bedtime.    . vitamin C (ASCORBIC ACID) 500 MG tablet Take 500 mg by mouth daily.     No current facility-administered medications for this visit.     Physical Exam: Vitals:   01/08/18 1219  BP: (!) 120/56  Pulse: 69  SpO2: 97%  Weight: 179 lb 6.4 oz (81.4 kg)  Height: _0  (1.626 m)    GEN- The patient is well appearing, alert and oriented x 3 today.   Head- normocephalic, atraumatic Eyes-  Sclera clear, conjunctiva pink Ears- hearing intact Oropharynx- clear Lungs- Clear to ausculation bilaterally, normal work of breathing Chest- pacemaker pocket is well  healed Heart- Regular rate and rhythm, no murmurs, rubs or gallops, PMI not laterally displaced GI- soft, NT, ND, + BS Extremities- no clubbing, cyanosis, or edema  Pacemaker interrogation- reviewed in detail today,  See PACEART report   Assessment and Plan:  1. Symptomatic sinus bradycardia and complete heart block Normal pacemaker function See Pace Art report No changes today  2. Paroxysmal atrial fibrillation No afib since 1/18 chads2vasc score is 5.  Continue xarelto Dr Alicia Nash stopped amiodarone in May.  She continues to do well  3. HTN Stable No change required today  carelink Return in a year  Alicia Grayer MD, [facility] 01/08/2018 12:42 PM

## 2018-01-10 LAB — CUP PACEART INCLINIC DEVICE CHECK
Battery Impedance: 809 Ohm
Battery Remaining Longevity: 58 mo
Battery Voltage: 2.77 V
Brady Statistic AP VP Percent: 20 %
Brady Statistic AP VS Percent: 0 %
Brady Statistic AS VP Percent: 80 %
Brady Statistic AS VS Percent: 0 %
Date Time Interrogation Session: 20190906165441
Implantable Lead Implant Date: 20150108
Implantable Lead Implant Date: 20150108
Implantable Lead Location: 753859
Implantable Lead Location: 753860
Implantable Lead Model: 5076
Implantable Lead Model: 5076
Implantable Pulse Generator Implant Date: 20150108
Lead Channel Impedance Value: 381 Ohm
Lead Channel Impedance Value: 400 Ohm
Lead Channel Pacing Threshold Amplitude: 0.5 V
Lead Channel Pacing Threshold Amplitude: 0.625 V
Lead Channel Pacing Threshold Amplitude: 0.75 V
Lead Channel Pacing Threshold Amplitude: 0.75 V
Lead Channel Pacing Threshold Pulse Width: 0.4 ms
Lead Channel Pacing Threshold Pulse Width: 0.4 ms
Lead Channel Pacing Threshold Pulse Width: 0.4 ms
Lead Channel Pacing Threshold Pulse Width: 0.4 ms
Lead Channel Sensing Intrinsic Amplitude: 0.7 mV
Lead Channel Setting Pacing Amplitude: 2 V
Lead Channel Setting Pacing Amplitude: 2.5 V
Lead Channel Setting Pacing Pulse Width: 0.4 ms
Lead Channel Setting Sensing Sensitivity: 2 mV

## 2018-01-12 ENCOUNTER — Other Ambulatory Visit: Payer: Self-pay

## 2018-01-12 ENCOUNTER — Observation Stay (HOSPITAL_COMMUNITY)
Admission: EM | Admit: 2018-01-12 | Discharge: 2018-01-14 | Disposition: A | Discharge status: Home / Self Care | Payer: Medicare Other | Attending: Family Medicine | Admitting: Family Medicine

## 2018-01-12 ENCOUNTER — Encounter (HOSPITAL_COMMUNITY): Payer: Self-pay | Admitting: Emergency Medicine

## 2018-01-12 ENCOUNTER — Ambulatory Visit (INDEPENDENT_AMBULATORY_CARE_PROVIDER_SITE_OTHER): Payer: Medicare Other | Admitting: Internal Medicine

## 2018-01-12 ENCOUNTER — Other Ambulatory Visit (INDEPENDENT_AMBULATORY_CARE_PROVIDER_SITE_OTHER): Payer: Self-pay | Admitting: Internal Medicine

## 2018-01-12 ENCOUNTER — Encounter (INDEPENDENT_AMBULATORY_CARE_PROVIDER_SITE_OTHER): Payer: Self-pay | Admitting: Internal Medicine

## 2018-01-12 VITALS — BP 140/50 | HR 72 | Temp 98.1°F | Ht 64.0 in | Wt 178.0 lb

## 2018-01-12 DIAGNOSIS — N183 Chronic kidney disease, stage 3 unspecified: Secondary | ICD-10-CM | POA: Diagnosis present

## 2018-01-12 DIAGNOSIS — F419 Anxiety disorder, unspecified: Secondary | ICD-10-CM | POA: Insufficient documentation

## 2018-01-12 DIAGNOSIS — I480 Paroxysmal atrial fibrillation: Secondary | ICD-10-CM | POA: Diagnosis not present

## 2018-01-12 DIAGNOSIS — K922 Gastrointestinal hemorrhage, unspecified: Secondary | ICD-10-CM

## 2018-01-12 DIAGNOSIS — D509 Iron deficiency anemia, unspecified: Secondary | ICD-10-CM | POA: Diagnosis present

## 2018-01-12 DIAGNOSIS — E559 Vitamin D deficiency, unspecified: Secondary | ICD-10-CM | POA: Diagnosis not present

## 2018-01-12 DIAGNOSIS — Z853 Personal history of malignant neoplasm of breast: Secondary | ICD-10-CM | POA: Diagnosis not present

## 2018-01-12 DIAGNOSIS — D500 Iron deficiency anemia secondary to blood loss (chronic): Principal | ICD-10-CM | POA: Insufficient documentation

## 2018-01-12 DIAGNOSIS — Z7901 Long term (current) use of anticoagulants: Secondary | ICD-10-CM | POA: Diagnosis not present

## 2018-01-12 DIAGNOSIS — I130 Hypertensive heart and chronic kidney disease with heart failure and stage 1 through stage 4 chronic kidney disease, or unspecified chronic kidney disease: Secondary | ICD-10-CM | POA: Insufficient documentation

## 2018-01-12 DIAGNOSIS — M109 Gout, unspecified: Secondary | ICD-10-CM | POA: Insufficient documentation

## 2018-01-12 DIAGNOSIS — D649 Anemia, unspecified: Secondary | ICD-10-CM

## 2018-01-12 DIAGNOSIS — I5032 Chronic diastolic (congestive) heart failure: Secondary | ICD-10-CM | POA: Insufficient documentation

## 2018-01-12 DIAGNOSIS — M8580 Other specified disorders of bone density and structure, unspecified site: Secondary | ICD-10-CM | POA: Diagnosis not present

## 2018-01-12 DIAGNOSIS — E785 Hyperlipidemia, unspecified: Secondary | ICD-10-CM | POA: Diagnosis not present

## 2018-01-12 DIAGNOSIS — K219 Gastro-esophageal reflux disease without esophagitis: Secondary | ICD-10-CM | POA: Diagnosis not present

## 2018-01-12 DIAGNOSIS — Z79899 Other long term (current) drug therapy: Secondary | ICD-10-CM | POA: Diagnosis not present

## 2018-01-12 LAB — BASIC METABOLIC PANEL
Anion gap: 10 (ref 5–15)
BUN: 29 mg/dL — ABNORMAL HIGH (ref 8–23)
CO2: 26 mmol/L (ref 22–32)
Calcium: 8.7 mg/dL — ABNORMAL LOW (ref 8.9–10.3)
Chloride: 107 mmol/L (ref 98–111)
Creatinine, Ser: 1.63 mg/dL — ABNORMAL HIGH (ref 0.44–1.00)
GFR calc Af Amer: 34 mL/min — ABNORMAL LOW (ref >60)
GFR calc non Af Amer: 29 mL/min — ABNORMAL LOW (ref >60)
Glucose, Bld: 109 mg/dL — ABNORMAL HIGH (ref 70–99)
Potassium: 3.9 mmol/L (ref 3.5–5.1)
Sodium: 143 mmol/L (ref 135–145)

## 2018-01-12 LAB — CBC WITH DIFFERENTIAL/PLATELET
Basophils Absolute: 0.1 10*3/uL (ref 0.0–0.1)
Basophils Relative: 1 %
Eosinophils Absolute: 0.5 10*3/uL (ref 0.0–0.7)
Eosinophils Relative: 7 %
HCT: 24.5 % — ABNORMAL LOW (ref 36.0–46.0)
Hemoglobin: 7.2 g/dL — ABNORMAL LOW (ref 12.0–15.0)
Lymphocytes Relative: 19 %
Lymphs Abs: 1.4 10*3/uL (ref 0.7–4.0)
MCH: 29.6 pg (ref 26.0–34.0)
MCHC: 29.4 g/dL — ABNORMAL LOW (ref 30.0–36.0)
MCV: 100.8 fL — ABNORMAL HIGH (ref 78.0–100.0)
Monocytes Absolute: 0.6 10*3/uL (ref 0.1–1.0)
Monocytes Relative: 8 %
Neutro Abs: 5 10*3/uL (ref 1.7–7.7)
Neutrophils Relative %: 65 %
Platelets: 327 10*3/uL (ref 150–400)
RBC: 2.43 MIL/uL — ABNORMAL LOW (ref 3.87–5.11)
RDW: 20.6 % — ABNORMAL HIGH (ref 11.5–15.5)
WBC: 7.5 10*3/uL (ref 4.0–10.5)

## 2018-01-12 LAB — ABO/RH: ABO/RH(D): A POS

## 2018-01-12 LAB — PREPARE RBC (CROSSMATCH)

## 2018-01-12 MED ORDER — SODIUM CHLORIDE 0.9% FLUSH
3.0000 mL | Freq: Two times a day (BID) | INTRAVENOUS | Status: DC
  Administered 2018-01-12 – 2018-01-14 (×4): 3 mL via INTRAVENOUS

## 2018-01-12 MED ORDER — DIAZEPAM 5 MG PO TABS
5.0000 mg | ORAL_TABLET | Freq: Two times a day (BID) | ORAL | Status: DC
  Administered 2018-01-13 – 2018-01-14 (×3): 5 mg via ORAL
  Filled 2018-01-12 (×3): qty 1

## 2018-01-12 MED ORDER — ANASTROZOLE 1 MG PO TABS
1.0000 mg | ORAL_TABLET | Freq: Every morning | ORAL | Status: DC
  Administered 2018-01-13 – 2018-01-14 (×2): 1 mg via ORAL
  Filled 2018-01-12 (×3): qty 1

## 2018-01-12 MED ORDER — ACETAMINOPHEN 650 MG RE SUPP: 650.0000 mg | Freq: Four times a day (QID) | RECTAL | Status: DC | PRN

## 2018-01-12 MED ORDER — BENAZEPRIL HCL 10 MG PO TABS
40.0000 mg | ORAL_TABLET | Freq: Every morning | ORAL | Status: DC
  Administered 2018-01-13 – 2018-01-14 (×2): 40 mg via ORAL
  Filled 2018-01-12 (×2): qty 4

## 2018-01-12 MED ORDER — ACETAMINOPHEN 325 MG PO TABS: 650.0000 mg | ORAL_TABLET | Freq: Four times a day (QID) | ORAL | Status: DC | PRN

## 2018-01-12 MED ORDER — INFLUENZA VAC SPLIT HIGH-DOSE 0.5 ML IM SUSY
0.5000 mL | PREFILLED_SYRINGE | INTRAMUSCULAR | Status: DC
  Filled 2018-01-12: qty 0.5

## 2018-01-12 MED ORDER — FUROSEMIDE 10 MG/ML IJ SOLN
20.0000 mg | Freq: Once | INTRAMUSCULAR | Status: AC
  Administered 2018-01-13: 20 mg via INTRAVENOUS
  Filled 2018-01-12: qty 2

## 2018-01-12 MED ORDER — SODIUM CHLORIDE 0.9 % IV SOLN: 250.0000 mL | INTRAVENOUS | Status: DC | PRN

## 2018-01-12 MED ORDER — LORATADINE 10 MG PO TABS: 10.0000 mg | ORAL_TABLET | Freq: Every day | ORAL | Status: DC | PRN

## 2018-01-12 MED ORDER — SODIUM CHLORIDE 0.9 % IV SOLN
10.0000 mL/h | Freq: Once | INTRAVENOUS | Status: AC
  Administered 2018-01-12: 10 mL/h via INTRAVENOUS

## 2018-01-12 MED ORDER — GEMFIBROZIL 600 MG PO TABS
600.0000 mg | ORAL_TABLET | Freq: Every morning | ORAL | Status: DC
  Administered 2018-01-13 – 2018-01-14 (×2): 600 mg via ORAL
  Filled 2018-01-12 (×2): qty 1

## 2018-01-12 MED ORDER — DOCUSATE SODIUM 100 MG PO CAPS
100.0000 mg | ORAL_CAPSULE | Freq: Every morning | ORAL | Status: DC
  Administered 2018-01-13 – 2018-01-14 (×2): 100 mg via ORAL
  Filled 2018-01-12 (×2): qty 1

## 2018-01-12 MED ORDER — VERAPAMIL HCL ER 180 MG PO TBCR
360.0000 mg | EXTENDED_RELEASE_TABLET | Freq: Every morning | ORAL | Status: DC
  Administered 2018-01-13 – 2018-01-14 (×2): 360 mg via ORAL
  Filled 2018-01-12 (×2): qty 2

## 2018-01-12 MED ORDER — PANTOPRAZOLE SODIUM 40 MG PO TBEC
40.0000 mg | DELAYED_RELEASE_TABLET | Freq: Every morning | ORAL | Status: DC
  Administered 2018-01-13 – 2018-01-14 (×2): 40 mg via ORAL
  Filled 2018-01-12 (×2): qty 1

## 2018-01-12 MED ORDER — CITALOPRAM HYDROBROMIDE 20 MG PO TABS
20.0000 mg | ORAL_TABLET | Freq: Every morning | ORAL | Status: DC
  Administered 2018-01-13 – 2018-01-14 (×2): 20 mg via ORAL
  Filled 2018-01-12 (×2): qty 1

## 2018-01-12 MED ORDER — VITAMIN C 500 MG PO TABS
500.0000 mg | ORAL_TABLET | Freq: Every morning | ORAL | Status: DC
  Administered 2018-01-13 – 2018-01-14 (×2): 500 mg via ORAL
  Filled 2018-01-12 (×2): qty 1

## 2018-01-12 MED ORDER — CRANBERRY 400 MG PO CAPS: 400.0000 mg | ORAL_CAPSULE | Freq: Every morning | ORAL | Status: DC

## 2018-01-12 MED ORDER — ADULT MULTIVITAMIN W/MINERALS CH
1.0000 | ORAL_TABLET | Freq: Every morning | ORAL | Status: DC
  Administered 2018-01-13 – 2018-01-14 (×2): 1 via ORAL
  Filled 2018-01-12 (×2): qty 1

## 2018-01-12 MED ORDER — FUROSEMIDE 40 MG PO TABS
40.0000 mg | ORAL_TABLET | Freq: Every morning | ORAL | Status: DC
  Administered 2018-01-13 – 2018-01-14 (×2): 40 mg via ORAL
  Filled 2018-01-12 (×2): qty 1

## 2018-01-12 MED ORDER — FEBUXOSTAT 40 MG PO TABS
40.0000 mg | ORAL_TABLET | Freq: Every morning | ORAL | Status: DC
  Administered 2018-01-13 – 2018-01-14 (×2): 40 mg via ORAL
  Filled 2018-01-12 (×2): qty 1

## 2018-01-12 MED ORDER — SODIUM CHLORIDE 0.9% FLUSH: 3.0000 mL | INTRAVENOUS | Status: DC | PRN

## 2018-01-12 NOTE — H&P (Addendum)
TRH H&P   Patient Demographics:    Alicia Nash, is a 77 y.o. female  MRN: [id number]   DOB - 1940/07/29  Admit Date - 01/12/2018  Outpatient Primary MD for the patient is Alicia Burly, MD  Referring MD/NP/PA: Carmin Nash  Outpatient Specialists:  Dr. Laural Golden (GI)   Burke Keels (GI)  Patient coming from:  Home  Chief Complaint  Patient presents with  . Abnormal Lab      HPI:    Alicia Nash  is a 77 y.o. female, w hx of Pafib, CHF (CKD stage 3, Anemia 07/2017 s/p colonoscopy 07/14/2017 => normal, EGD 07/14/2017=> normal apparently presented with slight lighheadedness to Dr. Otelia Nash office,  Pt had labs that showed significant drop in Hgb and sent to ED for evaluation.  Pt denies fever, chills, n/v, abd pain, diarrhea, brbpr.  Pt has black stool but this is due to iron.    In ED,  T 98  P 66 Bp 131/51  Pox 94% on RA  Wbc 7.5, Hgb 7.2, plt 327 Na 143, K 3.9, Bun 29, Creatinine 1.63,  Glucose 109  Pt will be admitted for w/up of anemia, possible GI bleeding.      Review of systems:    In addition to the HPI above,  No Fever-chills, No Headache, No changes with Vision or hearing, No problems swallowing food or Liquids, No Chest pain, Cough or Shortness of Breath, No Abdominal pain, No Nausea or Vommitting, Bowel movements are regular, No Blood in stool or Urine, No dysuria, No new skin rashes or bruises, No new joints pains-aches,  No new weakness, tingling, numbness in any extremity, No recent weight gain or loss, No polyuria, polydypsia or polyphagia, No significant Mental Stressors.  A full 10 point Review of Systems was done, except as stated above, all other Review of Systems were negative.   With Past History of the following :    Past Medical History:  Diagnosis Date  . Arthritis   . Breast cancer ([place])   . Cellulitis    Right leg  . CHF  (congestive heart failure) ([place])   . Hyperlipidemia   . Hypertension   . Lobular carcinoma of right breast ([place]) 09/27/2014  . Malignant neoplasm of upper-outer quadrant of right female breast ([place]) 09/27/2014  . Paroxysmal atrial fibrillation (HCC)   . Varicose veins    venous stasis skin changes      Past Surgical History:  Procedure Laterality Date  . ABDOMINAL HYSTERECTOMY    . CHOLECYSTECTOMY    . FOOT SURGERY     Multiple foot surgeries by Dr. Blanch Media  . GIVENS CAPSULE STUDY N/A 08/26/2017   Procedure: GIVENS CAPSULE STUDY;  Surgeon: Rogene Houston, MD;  Location: [facility];  Service: Endoscopy;  Laterality: N/A;  8:30  . JOINT REPLACEMENT     bilateral knee by Drs. Sallee Provencal  and McGinley  . PACEMAKER IMPLANT Left 05/12/2013   MDT Sherril Croon PPM implanted by Dr Dot Lanes for CHB and SSS  . SHOULDER SURGERY        Social History:     Social History   Tobacco Use  . Smoking status: Never Smoker  . Smokeless tobacco: Never Used  Substance Use Topics  . Alcohol use: No     Lives - at home Mobility - walks by self   Family History :     Family History  Problem Relation Age of Onset  . Hypertension Other   . Diabetes Mother       Home Medications:   Prior to Admission medications   Medication Sig Start Date End Date Taking? Authorizing Provider  acetaminophen (TYLENOL) 500 MG tablet Take 500 mg by mouth daily as needed for mild pain or moderate pain.   Yes [provider]  anastrozole (ARIMIDEX) 1 MG tablet Take 1 tablet (1 mg total) by mouth daily. Patient taking differently: Take 1 mg by mouth every morning.  11/11/17  Yes Derek Jack, MD  benazepril (LOTENSIN) 40 MG tablet Take 1 tablet by mouth every morning.  11/20/15  Yes [provider]  cetirizine (ZYRTEC) 10 MG tablet Take 10 mg by mouth daily as needed for allergies.  01/05/18  Yes [provider]  citalopram (CELEXA) 20 MG tablet Take 20 mg by mouth every morning.  12/12/15  Yes  [provider]  Cranberry 400 MG CAPS Take 1 tablet by mouth every morning.    Yes [provider]  diazepam (VALIUM) 5 MG tablet Take 1 tablet 2 (two) times daily by mouth. 12/14/15  Yes [provider]  docusate sodium (COLACE) 100 MG capsule Take 100 mg by mouth every morning.   Yes [provider]  furosemide (LASIX) 40 MG tablet Take 1 tablet by mouth every morning.  01/08/16  Yes [provider]  gemfibrozil (LOPID) 600 MG tablet Take 1 tablet by mouth every morning.  11/20/15  Yes [provider]  Multiple Vitamin (MULTIVITAMIN) tablet Take 1 tablet by mouth every morning.    Yes [provider]  pantoprazole (PROTONIX) 40 MG tablet Take 40 mg by mouth every morning.  12/07/15  Yes [provider]  rivaroxaban (XARELTO) 20 MG TABS tablet Take 1 tablet (20 mg total) by mouth daily with supper. Patient taking differently: Take 20 mg by mouth every morning.  12/30/16  Yes Herminio Commons, MD  ULORIC 40 MG tablet Take 1 tablet by mouth every morning.  01/08/16  Yes [provider]  verapamil (VERELAN PM) 360 MG 24 hr capsule Take 360 mg by mouth every morning.    Yes [provider]  vitamin C (ASCORBIC ACID) 500 MG tablet Take 500 mg by mouth every morning.    Yes [provider]     Allergies:     Allergies  Allergen Reactions  . Dihydrocodeine Other (See Comments)    No reaction noted. "funny feeling"  . Other     Blisters after wear compression stockings     Physical Exam:   Vitals  Blood pressure (!) 131/51, pulse 66, temperature 98 F (36.7 C), temperature source Oral, resp. rate 16, height _0  (1.626 m), weight 80.7 kg, SpO2 94 %.   1. General  lying in bed in NAD  2. Normal affect and insight, Not Suicidal or Homicidal, Awake Alert, Oriented X 3.,   3. No F.N deficits, ALL C.Nerves  Intact, Strength 5/5 all 4 extremities, Sensation intact all 4 extremities, Plantars down  going.  4. Ears and Eyes appear Normal, Conjunctivae pale , PERRLA. Moist Oral Mucosa.  5. Supple Neck, No JVD, No cervical lymphadenopathy appriciated, No Carotid Bruits.  6. Symmetrical Chest wall movement, Good air movement bilaterally, CTAB.  7. RRR, No Gallops, Rubs or Murmurs, No Parasternal Heave.  8. Positive Bowel Sounds, Abdomen Soft, No tenderness, No organomegaly appriciated,No rebound -guarding or rigidity.  9.  No Cyanosis, Normal Skin Turgor, No Skin Rash or Bruise.  10. Good muscle tone,  joints appear normal , no effusions, Normal ROM.  11. No Palpable Lymph Nodes in Neck or Axillae     Data Review:    CBC Recent Labs  Lab 01/12/18 0959 01/12/18 1603  WBC  --  7.5  HGB 7.0* 7.2*  HCT 21.3* 24.5*  PLT  --  327  MCV  --  100.8*  MCH  --  29.6  MCHC  --  29.4*  RDW  --  20.6*  LYMPHSABS  --  1.4  MONOABS  --  0.6  EOSABS  --  0.5  BASOSABS  --  0.1   ------------------------------------------------------------------------------------------------------------------  Chemistries  Recent Labs  Lab 01/12/18 1603  NA 143  K 3.9  CL 107  CO2 26  GLUCOSE 109*  BUN 29*  CREATININE 1.63*  CALCIUM 8.7*   ------------------------------------------------------------------------------------------------------------------ estimated creatinine clearance is 29.2 mL/min (A) (by C-G formula based on SCr of 1.63 mg/dL (H)). ------------------------------------------------------------------------------------------------------------------ No results for input(s): TSH, T4TOTAL, T3FREE, THYROIDAB in the last 72 hours.  Invalid input(s): FREET3  Coagulation profile No results for input(s): INR, PROTIME in the last 168 hours. ------------------------------------------------------------------------------------------------------------------- No results for input(s): DDIMER in the last 72  hours. -------------------------------------------------------------------------------------------------------------------  Cardiac Enzymes No results for input(s): CKMB, TROPONINI, MYOGLOBIN in the last 168 hours.  Invalid input(s): CK ------------------------------------------------------------------------------------------------------------------ No results found for: BNP   ---------------------------------------------------------------------------------------------------------------  Urinalysis No results found for: COLORURINE, APPEARANCEUR, LABSPEC, [place], GLUCOSEU, HGBUR, BILIRUBINUR, KETONESUR, PROTEINUR, UROBILINOGEN, NITRITE, LEUKOCYTESUR  ------------------------------------------------------------------------------   Imaging Results:    No results found.     Assessment & Plan:    Principal Problem:   Anemia Active Problems:   Chronic renal disease, stage 3, moderately decreased glomerular filtration rate between 30-59 mL/min/1.73 square meter (HCC)   Iron deficiency anemia   Gastrointestinal hemorrhage    Anemia Iron Def R/o GI bleeding NPO after MN Transfuse 2 units prbc GI consult per ED May need bleeding scan defer to GI or capsule endoscopy   Pafib/ Chronic CHF (diastolic) Hold Xarelto overnite in case GI would like to do another endoscopy Cont Benazepril 12m po qday Cont Verapamil 3650mpo qday Cont Lasix 4068mo qday  CKD stage 3 Check cmp in AM  Hyperlipidemia Cont gemfibrozil   Gerd Cont PPI  H/o breast cancer Cont Arimidex  Gout Cont Uloric 30m30m qday  Anxiety  Cont Diazepam 5mg 61mbid      DVT Prophylaxis - SCDs  AM Labs Ordered, also please review Full Orders  Family Communication: Admission, patients condition and plan of care including tests being ordered have been discussed with the patient who indicate understanding and agree with the plan and Code Status.  Code Status  FULL CODE    Likely DC to   home  Condition GUARDED    Consults called:  GI by ED, appreciate input  Admission status:  Observation, pt has symptomatic anemia, and requires hospital admission for transfusion  of 2units prbc.    Time spent in minutes : 70    Jani Gravel M.D on 01/12/2018 at 7:12 PM  Between 7am to 7pm - Pager - [phone]  After 7pm go to [email or web address] - password [facility]  [facility] - Office  [phone]

## 2018-01-12 NOTE — Patient Instructions (Signed)
H and H today

## 2018-01-12 NOTE — Progress Notes (Signed)
err

## 2018-01-12 NOTE — ED Triage Notes (Addendum)
PT states she had outpatient labs done today for Dr. Laural Golden. The office called her this evening and told her to come to the ED due to low hemoglobin. PT states some generalized weakness lightheadedness as well. Hgb 7.0 today.

## 2018-01-12 NOTE — Progress Notes (Signed)
Subjective:    Patient ID: Alicia Nash, female    DOB: 07/14/1940, 77 y.o.   MRN: [id number]  HPIPresents today with c/o dark stools.  She tells me she is woozy and light headed. She says she has been having black stools.  She says she feels like she needs blood. She takes Iron daily.Started 4 weeks ago. Received 2 [facility] infusion at [facility] in August.  Admitted to [facility] in April EGD and colonoscopy were normal. Found to IDA. She received 4 units of blood [facility].  Referred to Dr. Laural Golden for a Given's capsule.  Underwent a Given's capsule in April of this year.  Study is complete as given capsule reached cecum. Few small small bowel punctate AV malformations noted with speck of blood but no active bleeding noted. Findings reviewed with patient. She will have CBC this week. If bleeding recurs will consider GI bleeding scan.  EGD  311/2019  Normal. Dr. Anthony Sar (melena) Colonoscopy  07/14/2017 Normal. Dr. Anthony Sar ([place])  Maintained on Xarelto for atrial fib. Followed at cancer center for hx of rt breast carcinoma.  12/14/2017 Ferritin 44, Iron 35, TIBC 396, Sat 9, UIBC 361.  CBC    Component Value Date/Time   WBC 7.1 12/14/2017 1501   RBC 3.76 (L) 12/14/2017 1501   HGB 10.5 (L) 12/14/2017 1501   HCT 34.9 (L) 12/14/2017 1501   PLT 334 12/14/2017 1501   MCV 92.8 12/14/2017 1501   MCH 27.9 12/14/2017 1501   MCHC 30.1 12/14/2017 1501   RDW 14.8 12/14/2017 1501   LYMPHSABS 1.7 12/14/2017 1501   MONOABS 0.6 12/14/2017 1501   EOSABS 0.4 12/14/2017 1501   BASOSABS 0.1 12/14/2017 1501       Review of Systems Past Medical History:  Diagnosis Date  . Arthritis   . Breast cancer ([place])   . Cellulitis    Right leg  . CHF (congestive heart failure) ([place])   . Hyperlipidemia   . Hypertension   . Lobular carcinoma of right breast ([place]) 09/27/2014  . Malignant neoplasm of upper-outer quadrant of right female breast ([place]) 09/27/2014  . Paroxysmal atrial fibrillation (HCC)   . Varicose  veins    venous stasis skin changes    Past Surgical History:  Procedure Laterality Date  . ABDOMINAL HYSTERECTOMY    . CHOLECYSTECTOMY    . FOOT SURGERY     Multiple foot surgeries by Dr. Blanch Media  . GIVENS CAPSULE STUDY N/A 08/26/2017   Procedure: GIVENS CAPSULE STUDY;  Surgeon: Rogene Houston, MD;  Location[facility];  Service: Endoscopy;  Laterality: N/A;  8:30  . JOINT REPLACEMENT     bilateral knee by Drs. McGee and Bolivar  . PACEMAKER IMPLANT Left 05/12/2013   MDT Sherril Croon PPM implanted by Dr Dot Lanes for CHB and SSS  . SHOULDER SURGERY      Allergies  Allergen Reactions  . Dihydrocodeine Other (See Comments)  . Other     Blisters after wear compression stockings    Current Outpatient Medications on File Prior to Visit  Medication Sig Dispense Refill  . anastrozole (ARIMIDEX) 1 MG tablet Take 1 tablet (1 mg total) by mouth daily. 30 tablet 5  . benazepril (LOTENSIN) 40 MG tablet Take 1 tablet daily by mouth.    . citalopram (CELEXA) 20 MG tablet Take 1 tablet daily by mouth.    . Cranberry 400 MG CAPS Take 1 tablet daily by mouth.    . diazepam (VALIUM) 5 MG tablet Take 1  tablet 2 (two) times daily by mouth.    [name] Calcium (STOOL SOFTENER PO) Take by mouth.    . furosemide (LASIX) 40 MG tablet Take 1 tablet daily by mouth.    Marland Kitchen gemfibrozil (LOPID) 600 MG tablet Take 1 tablet daily by mouth.    . Multiple Vitamin (MULTIVITAMIN) tablet Take 1 tablet by mouth daily.    . pantoprazole (PROTONIX) 20 MG tablet Take 1 tablet daily by mouth.    . rivaroxaban (XARELTO) 20 MG TABS tablet Take 1 tablet (20 mg total) by mouth daily with supper. 90 tablet 3  . ULORIC 40 MG tablet Take 1 tablet daily by mouth.    . verapamil (VERELAN PM) 360 MG 24 hr capsule Take 360 mg by mouth at bedtime.    . vitamin C (ASCORBIC ACID) 500 MG tablet Take 500 mg by mouth daily.     No current facility-administered medications on file prior to visit.         Objective:   Physical Exam  Blood pressure (!) 140/50, pulse 72, temperature 98.1 F (36.7 C), height 5' 4" (1.626 m), weight 178 lb (80.7 kg). Alert and oriented. Skin warm and dry. Oral mucosa is moist.   . Sclera anicteric, conjunctivae is pink. Thyroid not enlarged. No cervical lymphadenopathy. Lungs clear. Heart regular rate and rhythm.  Abdomen is soft. Bowel sounds are positive. No hepatomegaly. No abdominal masses felt. No tenderness.  No edema to lower extremities.  Stool brown and guaiac positive.          Assessment & Plan:  GI bleed. Am going to get an H and H. Further recommendations to follow. May need a bleeding scan.

## 2018-01-12 NOTE — ED Provider Notes (Signed)
[facility] EMERGENCY DEPARTMENT Provider Note   CSN: [id number] Arrival date & time: 01/12/18  1457     History   Chief Complaint Chief Complaint  Patient presents with  . Abnormal Lab    HPI Alicia Nash is a 77 y.o. female.  HPI Patient presents with concern of weakness, fatigue. Patient went to her gastroenterologist office today, for follow-up after labs were performed yesterday. She is seeing a gastroneurologist due to history of chronic/intermittent GI bleed. Today, labs notable for anemia and she was sent here for evaluation. She notes that she has been feeling weaker over the past few days 2 weeks, without obvious bleeding, rectal, urinary or hematemesis. She has a history of receiving prior transfusions, as well as iron. She is on Xarelto due to a history of atrial fibrillation. Patient has had multiple prior investigations into her bleeding, including capsule endoscopy, endoscopy, colonoscopy, without firm source of bleeding identified. She notes that in addition to the weakness, she feels mild lightheadedness, but no focal pain, no confusion has had no episodes of syncope.    Past Medical History:  Diagnosis Date  . Arthritis   . Breast cancer ([place])   . Cellulitis    Right leg  . CHF (congestive heart failure) ([place])   . Hyperlipidemia   . Hypertension   . Lobular carcinoma of right breast ([place]) 09/27/2014  . Malignant neoplasm of upper-outer quadrant of right female breast ([place]) 09/27/2014  . Paroxysmal atrial fibrillation ([facility])   . Varicose veins    venous stasis skin changes    Patient Active Problem List   Diagnosis Date Noted  . Gastrointestinal hemorrhage 08/18/2017  . Iron deficiency anemia 06/25/2017  . Anemia of chronic disease 08/11/2015  . Arthritis 08/11/2015  . BMI 32.0-32.9,adult 08/11/2015  . Osteopenia 08/11/2015  . Aromatase inhibitor use 12/28/2014  . Chronic renal disease, stage 3, moderately decreased glomerular filtration rate  between 30-59 mL/min/1.73 square meter ([place]) 12/28/2014  . Mild vitamin D deficiency 12/28/2014  . Lobular carcinoma of right breast ([place]) 09/27/2014  . Post-menopausal 09/27/2014  . Primary ovarian failure 09/27/2014  . Complete heart block ([place]) 05/11/2013  . Swelling of limb 07/28/2011    Past Surgical History:  Procedure Laterality Date  . ABDOMINAL HYSTERECTOMY    . CHOLECYSTECTOMY    . FOOT SURGERY     Multiple foot surgeries by Dr. Blanch Media  . GIVENS CAPSULE STUDY N/A 08/26/2017   Procedure: GIVENS CAPSULE STUDY;  Surgeon: Rogene Houston, MD;  Location: [facility] ENDO SUITE;  Service: Endoscopy;  Laterality: N/A;  8:30  . JOINT REPLACEMENT     bilateral knee by Drs. McGee and [place]  . PACEMAKER IMPLANT Left 05/12/2013   MDT Sherril Croon PPM implanted by Dr Dot Lanes for CHB and SSS  . SHOULDER SURGERY       OB History    Gravida      Para      Term      Preterm      AB      Living  10     SAB      TAB      Ectopic      Multiple      Live Births               Home Medications    Prior to Admission medications   Medication Sig Start Date End Date Taking? Authorizing Provider  acetaminophen (TYLENOL) 500 MG tablet Take 500 mg by mouth daily  as needed for mild pain or moderate pain.   Yes [provider]  anastrozole (ARIMIDEX) 1 MG tablet Take 1 tablet (1 mg total) by mouth daily. Patient taking differently: Take 1 mg by mouth every morning.  11/11/17  Yes Derek Jack, MD  benazepril (LOTENSIN) 40 MG tablet Take 1 tablet by mouth every morning.  11/20/15  Yes [provider]  cetirizine (ZYRTEC) 10 MG tablet Take 10 mg by mouth daily as needed for allergies.  01/05/18  Yes [provider]  citalopram (CELEXA) 20 MG tablet Take 20 mg by mouth every morning.  12/12/15  Yes [provider]  Cranberry 400 MG CAPS Take 1 tablet by mouth every morning.    Yes [provider]  diazepam (VALIUM) 5 MG tablet Take 1 tablet 2  (two) times daily by mouth. 12/14/15  Yes [provider]  docusate sodium (COLACE) 100 MG capsule Take 100 mg by mouth every morning.   Yes [provider]  furosemide (LASIX) 40 MG tablet Take 1 tablet by mouth every morning.  01/08/16  Yes [provider]  gemfibrozil (LOPID) 600 MG tablet Take 1 tablet by mouth every morning.  11/20/15  Yes [provider]  Multiple Vitamin (MULTIVITAMIN) tablet Take 1 tablet by mouth every morning.    Yes [provider]  pantoprazole (PROTONIX) 40 MG tablet Take 40 mg by mouth every morning.  12/07/15  Yes [provider]  rivaroxaban (XARELTO) 20 MG TABS tablet Take 1 tablet (20 mg total) by mouth daily with supper. Patient taking differently: Take 20 mg by mouth every morning.  12/30/16  Yes Herminio Commons, MD  ULORIC 40 MG tablet Take 1 tablet by mouth every morning.  01/08/16  Yes [provider]  verapamil (VERELAN PM) 360 MG 24 hr capsule Take 360 mg by mouth every morning.    Yes [provider]  vitamin C (ASCORBIC ACID) 500 MG tablet Take 500 mg by mouth every morning.    Yes [provider]    Family History Family History  Problem Relation Age of Onset  . Hypertension Other   . Diabetes Mother     Social History Social History   Tobacco Use  . Smoking status: Never Smoker  . Smokeless tobacco: Never Used  Substance Use Topics  . Alcohol use: No  . Drug use: No     Allergies   Dihydrocodeine and Other   Review of Systems Review of Systems  Constitutional:       Per HPI, otherwise negative  HENT:       Per HPI, otherwise negative  Respiratory:       Per HPI, otherwise negative  Cardiovascular:       Per HPI, otherwise negative  Gastrointestinal: Negative for vomiting.  Endocrine:       Negative aside from HPI  Genitourinary:       Neg aside from HPI   Musculoskeletal:       Per HPI, otherwise negative  Skin: Negative.   Neurological:  Positive for weakness. Negative for syncope.     Physical Exam Updated Vital Signs BP (!) 119/49 (BP Location: Left Arm)   Pulse 66   Temp 98 F (36.7 C) (Oral)   Resp 16   Ht _0  (1.626 m)   Wt 80.7 kg   SpO2 98%   BMI 30.55 kg/m   Physical Exam  Constitutional: She is oriented to person, place, and time. She appears well-developed  and well-nourished. No distress.  HENT:  Head: Normocephalic and atraumatic.  Eyes: Conjunctivae and EOM are normal.  Cardiovascular: Normal rate and regular rhythm.  Pulmonary/Chest: Effort normal and breath sounds normal. No stridor. No respiratory distress.  Abdominal: She exhibits no distension. There is no tenderness. There is no guarding.  Musculoskeletal: She exhibits no edema.  Neurological: She is alert and oriented to person, place, and time. No cranial nerve deficit.  Skin: Skin is warm and dry.  Psychiatric: She has a normal mood and affect.  Nursing note and vitals reviewed.    ED Treatments / Results  Labs (all labs ordered are listed, but only abnormal results are displayed) Labs Reviewed  BASIC METABOLIC PANEL - Abnormal; Notable for the following components:      Result Value   Glucose, Bld 109 (*)    BUN 29 (*)    Creatinine, Ser 1.63 (*)    Calcium 8.7 (*)    GFR calc non Af Amer 29 (*)    GFR calc Af Amer 34 (*)    All other components within normal limits  CBC WITH DIFFERENTIAL/PLATELET - Abnormal; Notable for the following components:   RBC 2.43 (*)    Hemoglobin 7.2 (*)    HCT 24.5 (*)    MCV 100.8 (*)    MCHC 29.4 (*)    RDW 20.6 (*)    All other components within normal limits  TYPE AND SCREEN  PREPARE RBC (CROSSMATCH)    EKG None  Radiology No results found.  Procedures Procedures (including critical care time)  Medications Ordered in ED Medications  0.9 %  sodium chloride infusion (has no administration in time range)     Initial Impression / Assessment and Plan / ED Course  I have  reviewed the triage vital signs and the nursing notes.  Pertinent labs & imaging results that were available during my care of the patient were reviewed by me and considered in my medical decision making (see chart for details).    Review performed after initial visit notable for notes from GI office earlier today, as below:   Stool brown and guaiac positive.    She takes Iron daily.Started 4 weeks ago. Received 2 iron infusion at [facility] in August.  Admitted to [facility] in April EGD and colonoscopy were normal. Found to IDA. She received 4 units of blood at [facility].  Referred to Dr. Laural Golden for a Given's capsule.  Underwent a Given's capsule in April of this year.   Study is complete as given capsule reached cecum. Few small small bowel punctate AV malformations noted with speck of blood but no active bleeding noted.  6:45 PM Patient pain is similar presentation on repeat evaluation, fatigued in appearance, but in no distress. Labs notable for hemoglobin 7.2, consistent with outpatient value. Given Hemoccult positive status, concern for ongoing bleeding, particularly given her use of Xarelto, patient will be admitted for further evaluation, management, including consideration of nuclear medicine bleeding study, per GI recommendation. Patient has had a type and cross, is preparing for transfusion of 2 units blood.   Final Clinical Impressions(s) / ED Diagnoses  Symptomatic anemia  CRITICAL CARE Performed by: Carmin Muskrat Total critical care time: 35 minutes Critical care time was exclusive of separately billable procedures and treating other patients. Critical care was necessary to treat or prevent imminent or life-threatening deterioration. Critical care was time spent personally by me on the following activities: development of treatment plan with patient and/or surrogate as well  as nursing, discussions with consultants, evaluation of patient's response to treatment, examination of patient,  obtaining history from patient or surrogate, ordering and performing treatments and interventions, ordering and review of laboratory studies, ordering and review of radiographic studies, pulse oximetry and re-evaluation of patient's condition.    Carmin Muskrat, MD 01/12/18 [phone]

## 2018-01-13 ENCOUNTER — Encounter (HOSPITAL_COMMUNITY)
Admission: EM | Disposition: A | Discharge status: Home / Self Care | Payer: Self-pay | Source: Home / Self Care | Attending: Emergency Medicine

## 2018-01-13 ENCOUNTER — Encounter (HOSPITAL_COMMUNITY): Payer: Self-pay | Admitting: *Deleted

## 2018-01-13 DIAGNOSIS — K922 Gastrointestinal hemorrhage, unspecified: Secondary | ICD-10-CM | POA: Diagnosis not present

## 2018-01-13 DIAGNOSIS — D509 Iron deficiency anemia, unspecified: Secondary | ICD-10-CM

## 2018-01-13 DIAGNOSIS — N183 Chronic kidney disease, stage 3 (moderate): Secondary | ICD-10-CM | POA: Diagnosis not present

## 2018-01-13 DIAGNOSIS — K921 Melena: Secondary | ICD-10-CM

## 2018-01-13 DIAGNOSIS — D649 Anemia, unspecified: Secondary | ICD-10-CM

## 2018-01-13 DIAGNOSIS — D62 Acute posthemorrhagic anemia: Secondary | ICD-10-CM | POA: Diagnosis not present

## 2018-01-13 DIAGNOSIS — D500 Iron deficiency anemia secondary to blood loss (chronic): Secondary | ICD-10-CM | POA: Diagnosis not present

## 2018-01-13 HISTORY — PX: ESOPHAGOGASTRODUODENOSCOPY: SHX5428

## 2018-01-13 LAB — CBC WITH DIFFERENTIAL/PLATELET
Basophils Absolute: 0.1 10*3/uL (ref 0.0–0.1)
Basophils Relative: 1 %
Eosinophils Absolute: 0.5 10*3/uL (ref 0.0–0.7)
Eosinophils Relative: 7 %
HCT: 31.2 % — ABNORMAL LOW (ref 36.0–46.0)
Hemoglobin: 9.5 g/dL — ABNORMAL LOW (ref 12.0–15.0)
Lymphocytes Relative: 16 %
Lymphs Abs: 1.1 10*3/uL (ref 0.7–4.0)
MCH: 29.1 pg (ref 26.0–34.0)
MCHC: 30.4 g/dL (ref 30.0–36.0)
MCV: 95.7 fL (ref 78.0–100.0)
Monocytes Absolute: 0.5 10*3/uL (ref 0.1–1.0)
Monocytes Relative: 7 %
Neutro Abs: 4.6 10*3/uL (ref 1.7–7.7)
Neutrophils Relative %: 69 %
Platelets: 301 10*3/uL (ref 150–400)
RBC: 3.26 MIL/uL — ABNORMAL LOW (ref 3.87–5.11)
RDW: 19.4 % — ABNORMAL HIGH (ref 11.5–15.5)
WBC: 6.8 10*3/uL (ref 4.0–10.5)

## 2018-01-13 LAB — HEMOGLOBIN AND HEMATOCRIT, BLOOD
HCT: 21.3 % — ABNORMAL LOW (ref 35.0–45.0)
Hemoglobin: 7 g/dL — ABNORMAL LOW (ref 11.7–15.5)

## 2018-01-13 LAB — BASIC METABOLIC PANEL
Anion gap: 10 (ref 5–15)
BUN: 23 mg/dL (ref 8–23)
CO2: 27 mmol/L (ref 22–32)
Calcium: 8.9 mg/dL (ref 8.9–10.3)
Chloride: 106 mmol/L (ref 98–111)
Creatinine, Ser: 1.31 mg/dL — ABNORMAL HIGH (ref 0.44–1.00)
GFR calc Af Amer: 44 mL/min — ABNORMAL LOW (ref >60)
GFR calc non Af Amer: 38 mL/min — ABNORMAL LOW (ref >60)
Glucose, Bld: 96 mg/dL (ref 70–99)
Potassium: 3.8 mmol/L (ref 3.5–5.1)
Sodium: 143 mmol/L (ref 135–145)

## 2018-01-13 SURGERY — EGD (ESOPHAGOGASTRODUODENOSCOPY)
Anesthesia: Moderate Sedation

## 2018-01-13 MED ORDER — MIDAZOLAM HCL 5 MG/5ML IJ SOLN
INTRAMUSCULAR | Status: DC | PRN
  Administered 2018-01-13: 1 mg via INTRAVENOUS
  Administered 2018-01-13 (×2): 2 mg via INTRAVENOUS

## 2018-01-13 MED ORDER — MIDAZOLAM HCL 5 MG/5ML IJ SOLN
INTRAMUSCULAR | Status: AC
  Filled 2018-01-13: qty 10

## 2018-01-13 MED ORDER — LIDOCAINE VISCOUS HCL 2 % MT SOLN
OROMUCOSAL | Status: DC | PRN
  Administered 2018-01-13: 1 via OROMUCOSAL

## 2018-01-13 MED ORDER — MEPERIDINE HCL 50 MG/ML IJ SOLN
INTRAMUSCULAR | Status: AC
  Filled 2018-01-13: qty 1

## 2018-01-13 MED ORDER — MEPERIDINE HCL 50 MG/ML IJ SOLN
INTRAMUSCULAR | Status: DC | PRN
  Administered 2018-01-13 (×2): 25 mg via INTRAVENOUS

## 2018-01-13 MED ORDER — STERILE WATER FOR IRRIGATION IR SOLN
Status: DC | PRN
  Administered 2018-01-13: 1.25 mL

## 2018-01-13 MED ORDER — LIDOCAINE VISCOUS HCL 2 % MT SOLN
OROMUCOSAL | Status: AC
  Filled 2018-01-13: qty 15

## 2018-01-13 MED ORDER — SODIUM CHLORIDE 0.9 % IV SOLN
INTRAVENOUS | Status: DC
  Administered 2018-01-13: 15:00:00 via INTRAVENOUS

## 2018-01-13 MED ORDER — SODIUM CHLORIDE 0.9% FLUSH
INTRAVENOUS | Status: AC
  Filled 2018-01-13: qty 10

## 2018-01-13 NOTE — Progress Notes (Addendum)
Admitted yesterday thru the ED. Seen in our office yesterday. C/o dark stools. Felt woozy and light headed. States she had been having black stools. Felt she needed to be transfused. On Iron daily x 4 weeks.  Received 2 iron infusion at [facility] in August. Admitted to [name] in April EGD and colonoscopy were normal. Found to IDA. She received 4 units of blood at [facility].  Referred to Dr. Laural Golden for a Given's capsule.  Underwent a Given's capsule in April of this year.  Study is complete as given capsule reached cecum. Few small small bowel punctate AV malformations noted with speck of blood but no active  Maintained on Xarelto for atrial fib. [facility] for hx of rt breast cancer. Since admission she has received 2 units of PRBCs. She states she feels better and is not weak. Her stool was dark brown in office yesterday and was guaiac positive. I ordered a CBC which showed a hemoglobin of 7.0. Patient was asked to go to [facility] for possible admission and transfusion. I talked with Dr. Laverta Baltimore in the ED yesterday and advised him she would be coming.  Post transfusion hemoglobin 9.4. No BM since admission.  Blood pressure (!) 135/57, pulse 71, temperature 97.9 F (36.6 C), temperature source Oral, resp. rate 16, height _0  (1.626 m), weight 80.5 kg, SpO2 95 %. Assessment: GI bleed. Dr. Laural Golden is aware. Possible EGD.   GI attending note: Patient interviewed and examined.  Patient is 77 year old Caucasian female who has history of paroxysmal atrial fibrillation who was admitted to [facility] at 6 months ago with GI bleed.  She underwent EGD and colonoscopy and no source was found.  She received 4 units of PRBCs.  She underwent small bowel given capsule study on 08/26/2017. Study revealed few punctate small bowel AV malformations without active bleeding. She now returns with melena and weakness.  Her hemoglobin was 7 g. Patient has received 2 units of PRBCs.  She denies nausea vomiting or abdominal  pain.  Tarry stool started 5 days ago.  Stool was normal this morning.  She does not take OTC NSAIDs. Abdominal examination is within normal limits.  Posttransfusion H&H is 9.5 and 31.2.  Recurrent GI bleed possibly from upper GI tract or small bowel.  Suspect she has bleeding from angiodysplasia or Derul[place]oy lesion is hard to see unless he is actively bleeding. She presents with melena again it would be reasonable to review her upper GI tract. Patient is agreeable.  Esophagogastroduodenoscopy later today.

## 2018-01-13 NOTE — Op Note (Signed)
[facility] Patient Name: Alicia Nash Procedure Date: 01/13/2018 3:41 PM MRN: [id number] Date of Birth: Jan 28, 1941 Attending MD: Hildred Laser , MD CSN: [id number] Age: 77 Admit Type: Outpatient Procedure:                Upper GI endoscopy Indications:              Acute post hemorrhagic anemia, Melena Providers:                Hildred Laser, MD, Lurline Del, RN, Aram Candela Referring MD:             Stoney Bang, MD Medicines:                Lidocaine spray, Meperidine 50 mg IV, Midazolam 5                            mg IV Complications:            No immediate complications. Estimated Blood Loss:     Estimated blood loss: none. Procedure:                Pre-Anesthesia Assessment:                           - Prior to the procedure, a History and Physical                            was performed, and patient medications and                            allergies were reviewed. The patient's tolerance of                            previous anesthesia was also reviewed. The risks                            and benefits of the procedure and the sedation                            options and risks were discussed with the patient.                            All questions were answered, and informed consent                            was obtained. Prior Anticoagulants: The patient                            last took Xarelto (rivaroxaban) 1 day prior to the                            procedure. ASA Grade Assessment: III - A patient                            with severe systemic disease. After reviewing the  risks and benefits, the patient was deemed in                            satisfactory condition to undergo the procedure.                           After obtaining informed consent, the endoscope was                            passed under direct vision. Throughout the                            procedure, the patient's blood pressure, pulse, and                       oxygen saturations were monitored continuously. The                            [id number]-H190 ([id number]) scope was introduced through the                            mouth, and advanced to the third part of duodenum.                            The upper GI endoscopy was accomplished without                            difficulty. The patient tolerated the procedure                            well. Scope In: 4:16:42 PM Scope Out: 4:22:23 PM Total Procedure Duration: 0 hours 5 minutes 41 seconds  Findings:      The examined esophagus was normal.      The Z-line was regular and was found 43 cm from the incisors.      The entire examined stomach was normal.      The duodenal bulb, second portion of the duodenum and third portion of       the duodenum were normal. Impression:               - Normal esophagus.                           - Z-line regular, 43 cm from the incisors.                           - Normal stomach.                           - Normal duodenal bulb, second portion of the                            duodenum and third portion of the duodenum.                           - No specimens collected. Moderate Sedation:  Moderate (conscious) sedation was administered by the endoscopy nurse       and supervised by the endoscopist. The following parameters were       monitored: oxygen saturation, heart rate, blood pressure, CO2       capnography and response to care. Total physician intraservice time was       13 minutes. Recommendation:           - Return patient to hospital ward for ongoing care.                           - Low sodium diet today.                           - Continue present medications.                           - H/H in am.                           - Hold anti-coagulant for another 5 days.                           - Referral to [facility] for small bowel endoscopy on an                            outpatient basis. Procedure Code(s):        ---  Professional ---                           [id number], Esophagogastroduodenoscopy, flexible,                            transoral; diagnostic, including collection of                            specimen(s) by brushing or washing, when performed                            (separate procedure)                           G0500, Moderate sedation services provided by the                            same physician or other qualified health care                            professional performing a gastrointestinal                            endoscopic service that sedation supports,                            requiring the presence of an independent trained                            observer  to assist in the monitoring of the                            patient's level of consciousness and physiological                            status; initial 15 minutes of intra-service time;                            patient age 33 years or older (additional time may                            be reported with [phone], as appropriate) Diagnosis Code(s):        --- Professional ---                           D62, Acute posthemorrhagic anemia                           K92.1, Melena (includes Hematochezia) CPT copyright 2017 American [employer] Association. All rights reserved. The codes documented in this report are preliminary and upon coder review may  be revised to meet current compliance requirements. Hildred Laser, MD Hildred Laser, MD 01/13/2018 4:38:29 PM This report has been signed electronically. Number of Addenda: 0

## 2018-01-13 NOTE — Progress Notes (Signed)
Brief EGD note.  Normal esophagus, stomach, first, second and third part of duodenum.

## 2018-01-13 NOTE — Progress Notes (Addendum)
Patient Demographics:    Alicia Nash, is a 77 y.o. female, DOB - 03-Jan-1941, SUP:[id number]  Admit date - 01/12/2018   Admitting Physician Jani Gravel, MD  Outpatient Primary MD for the patient is [name], MD  LOS - 0   Chief Complaint  Patient presents with  . Abnormal Lab        Subjective:    Alicia Nash today has no fevers, no emesis,  No chest pain, feels hungry, wants to eat, no vomiting, had dark stools  Assessment  & Plan :    Principal Problem:   Anemia Active Problems:   Chronic renal disease, stage 3, moderately decreased glomerular filtration rate between 30-59 mL/min/1.73 square meter (HCC)   Iron deficiency anemia   Gastrointestinal hemorrhage  Brief Summary 77 year old female with history of recurrent GI bleed on chronic blood loss anemia due to presumed GI blood loss as well as history of CKD stage III, atrial fibrillation and diastolic dysfunction CHF admitted on 01/12/18 with concerns about dropping hemoglobin from baseline and dark stools  Plan:- 1) acute on chronic anemia----patient with dark stools, scheduled for EGD on 01/13/18 , prior colonoscopy and EGD from March 2019 without acute findings as well, capsule endoscopy also did not reveal any significant abnormalities except for possible angioectasia that were not bleeding.  Discussed with GI physician Dr. Laural Golden, repeat CBC in a.m. continue PPI, continue to hold Xarelto for now, patient hemoglobin recently around 10.5, on admission on the 01/12/2018 hemoglobin was down to 7.0, after transfusion of 2 units of packed cells hemoglobin is up to 9.5, possible discharge home if H&H stable in 24 hours, if further drop in H&H or more dark/melanotic stools patient may need further transfusion  2)PAFiB-stable, continue verapamil for rate control, Xarelto on hold due to GI bleed  3)HFpEF--- history of chronic diastolic  dysfunction CHF, no acute exacerbation at this time, continue Lasix and benazepril  4)CKD III--posttransfusion creatinine is better down to 1.3 from 1.6 on admission  Disposition/Need for in-Hospital Stay- patient unable to be discharged at this time due to acute GI bleed requiring transfusion, will recheck H&H in a.m., patient may need further transfusion or intervention if H&H drops again after transfusion. patient hemoglobin recently around 10.5, on admission on the 01/12/2018 hemoglobin was down to 7.0, after transfusion of 2 units of packed cells hemoglobin is up to 9.5, possible discharge home if H&H stable in 24 hours, if further drop in H&H or more dark/melanotic stools patient may need further transfusion   Code Status : Full   Disposition Plan  : home  Consults  :  Gi   DVT Prophylaxis  :    SCDs   Lab Results  Component Value Date   PLT 301 01/13/2018    Inpatient Medications  Scheduled Meds: . anastrozole  1 mg Oral q morning - 10a  . benazepril  40 mg Oral q morning - 10a  . citalopram  20 mg Oral q morning - 10a  . diazepam  5 mg Oral BID  . docusate sodium  100 mg Oral q morning - 10a  . febuxostat  40 mg Oral q morning - 10a  . furosemide  40 mg Oral q morning - 10a  .  gemfibrozil  600 mg Oral q morning - 10a  . Influenza vac split quadrivalent PF  0.5 mL Intramuscular Tomorrow-1000  . lidocaine      . meperidine      . midazolam      . multivitamin with minerals  1 tablet Oral q morning - 10a  . pantoprazole  40 mg Oral q morning - 10a  . sodium chloride flush  3 mL Intravenous Q12H  . sodium chloride flush      . verapamil  360 mg Oral q morning - 10a  . vitamin C  500 mg Oral q morning - 10a   Continuous Infusions: . sodium chloride     PRN Meds:.sodium chloride, acetaminophen **OR** acetaminophen, loratadine, sodium chloride flush    Anti-infectives (From admission, onward)   None        Objective:   Vitals:   01/13/18 1625 01/13/18 1630  01/13/18 1635 01/13/18 1656  BP: (!) 117/50 (!) 117/50 (!) 119/50 (!) 98/56  Pulse: (!) 59 60 (!) 59 (!) 59  Resp: _0 Temp:      TempSrc:      SpO2: 95% 94% 93% 94%  Weight:      Height:        Wt Readings from Last 3 Encounters:  01/13/18 80.5 kg  01/12/18 80.7 kg  01/08/18 81.4 kg     Intake/Output Summary (Last 24 hours) at 01/13/2018 1807 Last data filed at 01/13/2018 1300 Gross per 24 hour  Intake 240 ml  Output 1000 ml  Net -760 ml     Physical Exam Patient is examined daily including today on 01/13/18 , exams remain the same as of yesterday except that has changed   Gen:- Awake Alert,  In no apparent distress  HEENT:- .AT, No sclera icterus Neck-Supple Neck,No JVD,.  Lungs-  CTAB , good air movement CV- S1, S2 normal, irregular Abd-  +ve B.Sounds, Abd Soft, No tenderness,    Extremity/Skin:- No  edema,   good pulses Psych-affect is appropriate, oriented x3 Neuro-no new focal deficits, no tremors   Data Review:   Micro Results No results found for this or any previous visit (from the past 240 hour(s)).  Radiology Reports No results found.   CBC Recent Labs  Lab 01/12/18 0959 01/12/18 1603 01/13/18 0900  WBC  --  7.5 6.8  HGB 7.0* 7.2* 9.5*  HCT 21.3* 24.5* 31.2*  PLT  --  327 301  MCV  --  100.8* 95.7  MCH  --  29.6 29.1  MCHC  --  29.4* 30.4  RDW  --  20.6* 19.4*  LYMPHSABS  --  1.4 1.1  MONOABS  --  0.6 0.5  EOSABS  --  0.5 0.5  BASOSABS  --  0.1 0.1    Chemistries  Recent Labs  Lab 01/12/18 1603 01/13/18 0900  NA 143 143  K 3.9 3.8  CL 107 106  CO2 26 27  GLUCOSE 109* 96  BUN 29* 23  CREATININE 1.63* 1.31*  CALCIUM 8.7* 8.9   ------------------------------------------------------------------------------------------------------------------ No results for input(s): CHOL, HDL, LDLCALC, TRIG, CHOLHDL, LDLDIRECT in the last 72 hours.  No results found for:  HGBA1C ------------------------------------------------------------------------------------------------------------------ No results for input(s): TSH, T4TOTAL, T3FREE, THYROIDAB in the last 72 hours.  Invalid input(s): FREET3 ------------------------------------------------------------------------------------------------------------------ No results for input(s): VITAMINB12, FOLATE, FERRITIN, TIBC, IRON, RETICCTPCT in the last 72 hours.  Coagulation profile No results for input(s): INR, PROTIME in the last 168 hours.  No  results for input(s): DDIMER in the last 72 hours.  Cardiac Enzymes No results for input(s): CKMB, TROPONINI, MYOGLOBIN in the last 168 hours.  Invalid input(s): CK ------------------------------------------------------------------------------------------------------------------ No results found for: BNP   Roxan Hockey M.D on 01/13/2018 at 6:07 PM  Pager---[phone] Go to [email or web address] - password [email or web address] for contact info  [facility] - Office  [phone]

## 2018-01-13 NOTE — Care Management Obs Status (Signed)
[facility] NOTIFICATION   Patient Details  Name: Alicia Nash MRN: [id number] Date of Birth: 01-12-1941   Medicare Observation Status Notification Given:  Yes    Yaslin Kirtley, Chauncey Reading, RN 01/13/2018, 8:30 AM

## 2018-01-14 DIAGNOSIS — K921 Melena: Secondary | ICD-10-CM | POA: Diagnosis not present

## 2018-01-14 DIAGNOSIS — N183 Chronic kidney disease, stage 3 (moderate): Secondary | ICD-10-CM | POA: Diagnosis not present

## 2018-01-14 DIAGNOSIS — D649 Anemia, unspecified: Secondary | ICD-10-CM | POA: Diagnosis not present

## 2018-01-14 DIAGNOSIS — D500 Iron deficiency anemia secondary to blood loss (chronic): Secondary | ICD-10-CM | POA: Diagnosis not present

## 2018-01-14 DIAGNOSIS — K922 Gastrointestinal hemorrhage, unspecified: Secondary | ICD-10-CM

## 2018-01-14 DIAGNOSIS — D62 Acute posthemorrhagic anemia: Secondary | ICD-10-CM | POA: Diagnosis not present

## 2018-01-14 DIAGNOSIS — D509 Iron deficiency anemia, unspecified: Secondary | ICD-10-CM | POA: Diagnosis not present

## 2018-01-14 LAB — TYPE AND SCREEN
ABO/RH(D): A POS
Antibody Screen: NEGATIVE
Unit division: 0
Unit division: 0

## 2018-01-14 LAB — BPAM RBC
Blood Product Expiration Date: 201910052359
Blood Product Expiration Date: 201910052359
ISSUE DATE / TIME: 201909102117
ISSUE DATE / TIME: 201909110204
Unit Type and Rh: 6200
Unit Type and Rh: 6200

## 2018-01-14 LAB — HEMOGLOBIN AND HEMATOCRIT, BLOOD
HCT: 31.1 % — ABNORMAL LOW (ref 36.0–46.0)
Hemoglobin: 9.6 g/dL — ABNORMAL LOW (ref 12.0–15.0)

## 2018-01-14 LAB — CREATININE, SERUM
Creatinine, Ser: 1.45 mg/dL — ABNORMAL HIGH (ref 0.44–1.00)
GFR calc Af Amer: 39 mL/min — ABNORMAL LOW (ref >60)
GFR calc non Af Amer: 34 mL/min — ABNORMAL LOW (ref >60)

## 2018-01-14 LAB — BUN: BUN: 23 mg/dL (ref 8–23)

## 2018-01-14 MED ORDER — PANTOPRAZOLE SODIUM 40 MG PO TBEC: 40.0000 mg | DELAYED_RELEASE_TABLET | Freq: Every day | ORAL | 4 refills | Status: DC

## 2018-01-14 MED ORDER — RIVAROXABAN 20 MG PO TABS: 20.0000 mg | ORAL_TABLET | Freq: Every day | ORAL | 3 refills | Status: DC

## 2018-01-14 NOTE — Discharge Summary (Signed)
Alicia Nash, is a 77 y.o. female  DOB 1941-04-07  MRN [id number].  Admission date:  01/12/2018  Admitting Physician  Jani Gravel, MD  Discharge Date:  01/14/2018   Primary MD  [name], MD  Recommendations for primary care physician for things to follow:   1)Hold Xarelto until Monday 01/18/18--- Dr. Laural Golden OR  Dr. Elmo Putt will call you to let you know if you should continue Xarelto 2)Avoid ibuprofen/Advil/Aleve/Motrin/Goody Powders/Naproxen/BC powders/Meloxicam/Diclofenac/Indomethacin and other Nonsteroidal anti-inflammatory medications as these will make you more likely to bleed and can cause stomach ulcers, can also cause Kidney problems.  3)Follow-up with your primary care doctor or with your cardiologist next week for recheck /reevaluation and CBC check 4)Dr. Laural Golden will Help  to schedule you for [facility] for possible small bowel Endoscopy 5) call or return if black or dark stools or if any further concerns about bleeding from your bowels 6) your creatinine is trending up, you need to repeat your BMP/kidney test next week when you are seen by your doctor  Admission Diagnosis  Abnormal labs   Discharge Diagnosis  Abnormal labs    Principal Problem:   Anemia Active Problems:   Chronic renal disease, stage 3, moderately decreased glomerular filtration rate between 30-59 mL/Nash/1.73 square meter (HCC)   Iron deficiency anemia   Gastrointestinal hemorrhage      Past Medical History:  Diagnosis Date  . Arthritis   . Breast cancer ([place])   . Cellulitis    Right leg  . CHF (congestive heart failure) ([place])   . Hyperlipidemia   . Hypertension   . Lobular carcinoma of right breast ([place]) 09/27/2014  . Malignant neoplasm of upper-outer quadrant of right female breast ([place]) 09/27/2014  . Paroxysmal atrial fibrillation (HCC)   . Varicose veins    venous stasis skin changes     Past Surgical History:  Procedure Laterality Date  . ABDOMINAL HYSTERECTOMY    . CHOLECYSTECTOMY    . FOOT SURGERY     Multiple foot surgeries by Dr. Blanch Media  . GIVENS CAPSULE STUDY N/A 08/26/2017   Procedure: GIVENS CAPSULE STUDY;  Surgeon: Rogene Houston, MD;  Location: [facility] END[facility]ITE;  Service: Endoscopy;  Laterality: N/A;  8:30  . JOINT REPLACEMENT     bilateral knee by Drs. McGee and [place]  . PACEMAKER IMPLANT Left 05/12/2013   MDT Sherril Croon PPM implanted by Dr Dot Lanes for CHB and SSS  . SHOULDER SURGERY         HPI  from the history and physical done on the day of admission:    Alicia Nash  is a 77 y.o. female, w hx of Pafib, CHF (CKD stage 3, Anemia 07/2017 s/p colonoscopy 07/14/2017 => normal, EGD 07/14/2017=> normal apparently presented with slight lighheadedness to Dr. Otelia Limes office,  Pt had labs that showed significant drop in Hgb and sent to ED for evaluation.  Pt denies fever, chills, n/v, abd pain, diarrhea, brbpr.  Pt has black stool but this is due to iron.  In ED,  T 98  P 66 Bp 131/51  Pox 94% on RA  Wbc 7.5, Hgb 7.2, plt 327 Na 143, K 3.9, Bun 29, Creatinine 1.63,  Glucose 109  Pt will be admitted for w/up of anemia, possible GI bleeding.        Hospital Course:    Brief Summary 77 year old female with history of recurrent GI bleed on chronic blood loss anemia due to presumed GI blood loss as well as history of CKD stage III, atrial fibrillation and diastolic dysfunction CHF admitted on 01/12/18 with concerns about dropping hemoglobin from baseline and dark stools  Plan:- 1) acute on chronic anemia---- EGD on 01/13/18 with Normal esophagus, stomach,first,second and third part of duodenum, prior colonoscopy and EGD from March 2019 without acute findings as well, capsule endoscopy also did not reveal any significant abnormalities except for possible angiogram ataxia that were not bleeding.  Discussed with GI physician Dr. Laural Golden, H/h stable (9.6),  continue PPI, continue to hold Xarelto for now, patient hemoglobin recently around 10.5, on admission on the 01/12/2018 hemoglobin was down to 7.0, after transfusion of 2 units of packed cells hemoglobin is up to 9.6, Dr. Laural Golden will Help  to schedule her for [facility] for possible small bowel Endoscopy  2)PAFiB-stable, continue verapamil for rate control, Xarelto on hold due to GI bleed,  C/n to Hold Xarelto until Monday 01/18/18--- Dr. Laural Golden OR  Dr. Elmo Putt will call her to let her know if she should continue Xarelto or switch to Eliquis.  CHA2DS2- VASc score   is = 5  (Age (2), HTN, gender, dCHF)  Which is  equal to = 7.2% annual risk of stroke    3)HFpEF--- history of chronic diastolic dysfunction CHF, no acute exacerbation at this time, continue Lasix and benazepril, recent echo not available to evaluate EF  4)CKD III--posttransfusion creatinine is better , down to 1.4 from 1.6 on admission, repeat BMP next week with PCP avoid nephrotoxic agents   Code Status : Full   Disposition Plan  : home  Consults  :  Gi    Discharge Condition: stable  Follow UP- Gi/cardiology and PCP    Diet and Activity recommendation:  As advised  Discharge Instructions    \ Discharge Instructions    Call MD for:  difficulty breathing, headache or visual disturbances   Complete by:  As directed    Call MD for:  extreme fatigue   Complete by:  As directed    Call MD for:  persistant dizziness or light-headedness   Complete by:  As directed    Call MD for:  persistant nausea and vomiting   Complete by:  As directed    Call MD for:  severe uncontrolled pain   Complete by:  As directed    Call MD for:  temperature >100.4   Complete by:  As directed    Diet - low sodium heart healthy   Complete by:  As directed    Discharge instructions   Complete by:  As directed    1)Hold Xarelto until Monday 01/18/18--- Dr. Laural Golden OR  Dr. Elmo Putt will call you to let you know if you  should continue Xarelto 2)Avoid ibuprofen/Advil/Aleve/Motrin/Goody Powders/Naproxen/BC powders/Meloxicam/Diclofenac/Indomethacin and other Nonsteroidal anti-inflammatory medications as these will make you more likely to bleed and can cause stomach ulcers, can also cause Kidney problems.  3)Follow-up with your primary care doctor or with your cardiologist next week for recheck /reevaluation and CBC check 4)Dr. Laural Golden will Help  to schedule you for [facility] for possible small bowel Endoscopy 5) call or return if black or dark stools or if any further concerns about bleeding from your bowels 6) your creatinine is trending up, you need to repeat your BMP/kidney test next week when you are seen by your doctor   Increase activity slowly   Complete by:  As directed         Discharge Medications     Allergies as of 01/14/2018      Reactions   Dihydrocodeine Other (See Comments)   No reaction noted. "funny feeling"   Other    Blisters after wear compression stockings      Medication List    TAKE these medications   acetaminophen 500 MG tablet Commonly known as:  TYLENOL Take 500 mg by mouth daily as needed for mild pain or moderate pain.   anastrozole 1 MG tablet Commonly known as:  ARIMIDEX Take 1 tablet (1 mg total) by mouth daily. What changed:  when to take this   benazepril 40 MG tablet Commonly known as:  LOTENSIN Take 1 tablet by mouth every morning.   cetirizine 10 MG tablet Commonly known as:  ZYRTEC Take 10 mg by mouth daily as needed for allergies.   citalopram 20 MG tablet Commonly known as:  CELEXA Take 20 mg by mouth every morning.   Cranberry 400 MG Caps Take 1 tablet by mouth every morning.   diazepam 5 MG tablet Commonly known as:  VALIUM Take 1 tablet 2 (two) times daily by mouth.   docusate sodium 100 MG capsule Commonly known as:  COLACE Take 100 mg by mouth every morning.   furosemide 40 MG tablet Commonly known as:  LASIX Take 1  tablet by mouth every morning.   gemfibrozil 600 MG tablet Commonly known as:  LOPID Take 1 tablet by mouth every morning.   multivitamin tablet Take 1 tablet by mouth every morning.   pantoprazole 40 MG tablet Commonly known as:  PROTONIX Take 1 tablet (40 mg total) by mouth daily. What changed:  when to take this   rivaroxaban 20 MG Tabs tablet Commonly known as:  XARELTO Take 1 tablet (20 mg total) by mouth daily with supper. Hold until Monday 01/18/18 What changed:  additional instructions   ULORIC 40 MG tablet Generic drug:  febuxostat Take 1 tablet by mouth every morning.   verapamil 360 MG 24 hr capsule Commonly known as:  VERELAN PM Take 360 mg by mouth every morning.   vitamin C 500 MG tablet Commonly known as:  ASCORBIC ACID Take 500 mg by mouth every morning.       Major procedures and Radiology Reports - PLEASE review detailed and final reports for all details, in brief -  Micro Results   Today   Subjective    Alicia Nash today has no new concerns, patient had brown stool, no dizziness no palpitations no chest pains no shortness of breath          Patient has been seen and examined prior to discharge   Objective   Blood pressure 135/77, pulse 70, temperature 98 F (36.7 C), temperature source Oral, resp. rate 18, height _0  (1.626 m), weight 80.3 kg, SpO2 97 %.   Intake/Output Summary (Last 24 hours) at 01/14/2018 1141 Last data filed at 01/14/2018 0900 Gross per 24 hour  Intake 720 ml  Output 450 ml  Net 270 ml    Exam Gen:- Awake Alert,  In  no apparent distress  HEENT:- [place].AT, No sclera icterus Neck-Supple Neck,No JVD,.  Lungs-  CTAB , good air movement CV- S1, S2 normal, irregular Abd-  +ve B.Sounds, Abd Soft, No tenderness,    Extremity/Skin:- No  edema,   good pulses Psych-affect is appropriate, oriented x3 Neuro-no new focal deficits, no tremors   Data Review   CBC w Diff:  Lab Results  Component Value Date   WBC 6.8  01/13/2018   HGB 9.6 (L) 01/14/2018   HCT 31.1 (L) 01/14/2018   PLT 301 01/13/2018   LYMPHOPCT 16 01/13/2018   MONOPCT 7 01/13/2018   EOSPCT 7 01/13/2018   BASOPCT 1 01/13/2018    CMP:  Lab Results  Component Value Date   NA 143 01/13/2018   K 3.8 01/13/2018   CL 106 01/13/2018   CO2 27 01/13/2018   BUN 23 01/14/2018   CREATININE 1.45 (H) 01/14/2018   PROT 7.4 12/14/2017   ALBUMIN 3.7 12/14/2017   BILITOT 0.4 12/14/2017   ALKPHOS 100 12/14/2017   AST 16 12/14/2017   ALT 11 12/14/2017  .   Total Discharge time is about 33 minutes  Roxan Hockey M.D on 01/14/2018 at 11:41 AM  Pager---[phone]  Go to [email or web address] - password [email or web address] for contact info  [facility] - Office  [phone]

## 2018-01-14 NOTE — Progress Notes (Signed)
IV discontinued,catheter intact. Discharge instructions given on medications and follow up visits,patient verbalized understanding. Prescriptions sent with patient.

## 2018-01-14 NOTE — Discharge Instructions (Signed)
1)Hold Xarelto until Monday 01/18/18--- Dr. Laural Golden OR  Dr. Elmo Putt will call you to let you know if you should continue Xarelto 2)Avoid ibuprofen/Advil/Aleve/Motrin/Goody Powders/Naproxen/BC powders/Meloxicam/Diclofenac/Indomethacin and other Nonsteroidal anti-inflammatory medications as these will make you more likely to bleed and can cause stomach ulcers, can also cause Kidney problems.  3)Follow-up with your primary care doctor or with your cardiologist next week for recheck /reevaluation and CBC check 4)Dr. Laural Golden will Help  to schedule you for [facility] for possible small bowel Endoscopy 5) call or return if black or dark stools or if any further concerns about bleeding from your bowels 6) your creatinine is trending up, you need to repeat your BMP/kidney test next week when you are seen by your doctor

## 2018-01-14 NOTE — Progress Notes (Addendum)
Patient ID: Alicia Nash, female   DOB: [birth year], 77 y.o.   MRN: [id number] Alert. She has no complaints. She states she feels much better. Hemoglobin 9.6 this am, stable from yesterday. EGD yesterday did not reveal source of bleeding. CBC    Component Value Date/Time   WBC 6.8 01/13/2018 0900   RBC 3.26 (L) 01/13/2018 0900   HGB 9.6 (L) 01/14/2018 0513   HCT 31.1 (L) 01/14/2018 0513   PLT 301 01/13/2018 0900   MCV 95.7 01/13/2018 0900   MCH 29.1 01/13/2018 0900   MCHC 30.4 01/13/2018 0900   RDW 19.4 (H) 01/13/2018 0900   LYMPHSABS 1.1 01/13/2018 0900   MONOABS 0.5 01/13/2018 0900   EOSABS 0.5 01/13/2018 0900   BASOSABS 0.1 01/13/2018 0900    Assessment/Plan: GI bleed.  Dr. Laural Golden will refer to [facility] for small bowel endoscopy.  GI attending note: Patient feels fine.  She has no complaints.  She is not passing normal stool. Patient stable for discharge. Patient can resume Xarelto on 01/18/2018. Has an appointment with her cardiologist on 01/28/2018. I will talk with Dr. Bronson Ing if there is any benefit of switching her anticoagulant to Eliquis. We will arrange for outpatient small bowel endoscopy and [facility] unless decision made to stop anticoagulant.

## 2018-01-19 ENCOUNTER — Ambulatory Visit (INDEPENDENT_AMBULATORY_CARE_PROVIDER_SITE_OTHER): Payer: Medicare Other | Admitting: Internal Medicine

## 2018-01-19 ENCOUNTER — Encounter (HOSPITAL_COMMUNITY): Payer: Self-pay | Admitting: Internal Medicine

## 2018-01-19 ENCOUNTER — Telehealth: Payer: Self-pay | Admitting: Cardiovascular Disease

## 2018-01-19 ENCOUNTER — Other Ambulatory Visit (INDEPENDENT_AMBULATORY_CARE_PROVIDER_SITE_OTHER): Payer: Self-pay | Admitting: Internal Medicine

## 2018-01-19 MED ORDER — APIXABAN 2.5 MG PO TABS: 2.5000 mg | ORAL_TABLET | Freq: Two times a day (BID) | ORAL | 1 refills | Status: DC

## 2018-01-19 NOTE — Telephone Encounter (Signed)
Talked with patient earlier today. She forgot to resume her Xarelto yesterday. Discussed with Su Hilt, PA at Dr. Bronson Ing office. Prescription for Eliquis 2.5 mg twice daily sent to her pharmacy. She will pick up a card/coupon from their office. She has an appointment to see Dr. Bronson Ing next week. Unless anticoagulation status changes will refer her to [facility] for small bowel endoscopy.

## 2018-01-19 NOTE — Telephone Encounter (Signed)
Per Dr. Laural Golden, pt is going to be switching to Eliquis due to having 2 bleeds and he'd like for her to have a one of the cards. States she will pick it up later

## 2018-01-19 NOTE — Telephone Encounter (Signed)
Put 30 day free card in cabinet in front office.

## 2018-01-28 ENCOUNTER — Ambulatory Visit (INDEPENDENT_AMBULATORY_CARE_PROVIDER_SITE_OTHER): Payer: Medicare Other | Admitting: *Deleted

## 2018-01-28 DIAGNOSIS — Z5181 Encounter for therapeutic drug level monitoring: Secondary | ICD-10-CM | POA: Diagnosis not present

## 2018-01-28 DIAGNOSIS — I4891 Unspecified atrial fibrillation: Secondary | ICD-10-CM | POA: Diagnosis not present

## 2018-01-28 NOTE — Progress Notes (Signed)
Pt was changed to Eliquis 2.74m bid on 9/16 by Dr RLaural Goldendue to recurrent GI bleed.  Xarelto was discontinued when pt was admitted to the hospital. Hx: PAF   [facility] due to heme + stools.  States she was very anemic again and received 2 U PRBC.  HGB 7.0  She had endo and colonoscopy by Dr RLaural Golden  No problems found to account for blood loss.  Has Hx of anemia and received iron infusions.  [facility]  Reviewed patients medication list. Pt is notcurrently on any combined P-gp and strong CYP3A4 inhibitors/inducers (ketoconazole, traconazole, ritonavir, carbamazepine, phenytoin, rifampin, St. John's wort). Reviewed labs from 01/14/18. SCr 1.4, Weight81.7,CrCl 42.71 Dose isappropriate based on CrCl. Hgb and HCT: 9.6/31  A full discussion of the nature of anticoagulants has been carried out. A benefit/risk analysis has been presented to the patient, so that they understand the justification for choosing anticoagulation with Xarelto at this time. The need for compliance is stressed. Pt is aware to take the medication once daily with the largest meal of the day. Side effects of potential bleeding are discussed, including unusual colored urine or stools, coughing up blood or coffee ground emesis, nose bleeds or serious fall or head trauma. Discussed signs and symptoms of stroke. The patient should avoid any OTC items containing aspirin or ibuprofen. Avoid alcohol consumption. Call if any signs of abnormal bleeding. Discussed financial obligations and resolved any difficulty in obtaining medication. Next lab test in 1 month.   Appointment made for 03/04/18

## 2018-02-01 DIAGNOSIS — K210 Gastro-esophageal reflux disease with esophagitis: Secondary | ICD-10-CM | POA: Diagnosis not present

## 2018-02-01 DIAGNOSIS — I10 Essential (primary) hypertension: Secondary | ICD-10-CM | POA: Diagnosis not present

## 2018-02-02 DIAGNOSIS — Z131 Encounter for screening for diabetes mellitus: Secondary | ICD-10-CM | POA: Diagnosis not present

## 2018-02-02 DIAGNOSIS — L603 Nail dystrophy: Secondary | ICD-10-CM | POA: Diagnosis not present

## 2018-02-02 DIAGNOSIS — Z79899 Other long term (current) drug therapy: Secondary | ICD-10-CM | POA: Diagnosis not present

## 2018-03-04 ENCOUNTER — Ambulatory Visit (INDEPENDENT_AMBULATORY_CARE_PROVIDER_SITE_OTHER): Payer: Medicare Other | Admitting: *Deleted

## 2018-03-04 DIAGNOSIS — I4891 Unspecified atrial fibrillation: Secondary | ICD-10-CM

## 2018-03-04 DIAGNOSIS — Z5181 Encounter for therapeutic drug level monitoring: Secondary | ICD-10-CM

## 2018-03-04 NOTE — Progress Notes (Signed)
Pt was changed to Eliquis 2.63m bid on 9/16 by Dr RLaural Goldendue to recurrent GI bleed.  Xarelto was discontinued when pt was admitted to the hospital. Hx: PAF  Pt is doing well since starting Eliquis.  She has not had any bleeding, excessive bruising or recurrent GI problems.  Reviewed patients medication list. Pt is notcurrently on any combined P-gp and strong CYP3A4 inhibitors/inducers (ketoconazole, traconazole, ritonavir, carbamazepine, phenytoin, rifampin, St. John's wort). Reviewed labsfrom 02/05/18 @ [facility]. SCr 1.16 , Weight80kg,CrCl50.84Dose isappropriate based on CrCl. Hgb and HCT: 12.5/41.3  Plats 281  A full discussion of the nature of anticoagulants has been carried out. A benefit/risk analysis has been presented to the patient, so that they understand the justification for choosing anticoagulation with Eliquis at this time. The need for compliance is stressed. Pt is aware to take the medication once daily with the largest meal of the day. Side effects of potential bleeding are discussed, including unusual colored urine or stools, coughing up blood or coffee ground emesis, nose bleeds or serious fall or head trauma. Discussed signs and symptoms of stroke. The patient should avoid any OTC items containing aspirin or ibuprofen. Avoid alcohol consumption. Call if any signs of abnormal bleeding. Discussed financial obligations and resolved any difficulty in obtaining medication. Next lab test in 6 months.  LMOM for pt that labs were stable.  Hgb has improved.  6 month flollow up placed in recall.

## 2018-03-08 ENCOUNTER — Other Ambulatory Visit (HOSPITAL_COMMUNITY)
Admission: RE | Admit: 2018-03-08 | Discharge: 2018-03-08 | Disposition: A | Discharge status: Home / Self Care | Payer: Medicare Other | Source: Ambulatory Visit | Attending: Cardiovascular Disease | Admitting: Cardiovascular Disease

## 2018-03-08 DIAGNOSIS — Z5181 Encounter for therapeutic drug level monitoring: Secondary | ICD-10-CM | POA: Diagnosis present

## 2018-03-08 DIAGNOSIS — I4891 Unspecified atrial fibrillation: Secondary | ICD-10-CM | POA: Diagnosis present

## 2018-03-08 LAB — BASIC METABOLIC PANEL
Anion gap: 9 (ref 5–15)
BUN: 29 mg/dL — ABNORMAL HIGH (ref 8–23)
CO2: 26 mmol/L (ref 22–32)
Calcium: 9.2 mg/dL (ref 8.9–10.3)
Chloride: 104 mmol/L (ref 98–111)
Creatinine, Ser: 1.16 mg/dL — ABNORMAL HIGH (ref 0.44–1.00)
GFR calc Af Amer: 51 mL/min — ABNORMAL LOW (ref >60)
GFR calc non Af Amer: 44 mL/min — ABNORMAL LOW (ref >60)
Glucose, Bld: 87 mg/dL (ref 70–99)
Potassium: 3.7 mmol/L (ref 3.5–5.1)
Sodium: 139 mmol/L (ref 135–145)

## 2018-03-08 LAB — CBC
HCT: 41.3 % (ref 36.0–46.0)
Hemoglobin: 12.5 g/dL (ref 12.0–15.0)
MCH: 28.6 pg (ref 26.0–34.0)
MCHC: 30.3 g/dL (ref 30.0–36.0)
MCV: 94.5 fL (ref 80.0–100.0)
Platelets: 281 10*3/uL (ref 150–400)
RBC: 4.37 MIL/uL (ref 3.87–5.11)
RDW: 13.9 % (ref 11.5–15.5)
WBC: 6.5 10*3/uL (ref 4.0–10.5)
nRBC: 0 % (ref 0.0–0.2)

## 2018-03-09 ENCOUNTER — Encounter: Payer: Medicare Other | Admitting: *Deleted

## 2018-03-09 ENCOUNTER — Telehealth: Payer: Self-pay

## 2018-03-09 NOTE — Telephone Encounter (Signed)
Attempted to confirm remote transmission with pt. No answer and was unable to leave a message.   

## 2018-03-15 ENCOUNTER — Encounter: Payer: Self-pay | Admitting: Cardiology

## 2018-03-16 ENCOUNTER — Telehealth: Payer: Self-pay | Admitting: *Deleted

## 2018-03-16 NOTE — Telephone Encounter (Signed)
Notes recorded by Laurine Blazer, LPN on 02/26/8526 at 9:08 AM EST Patient notified and verbalized understanding. Copy to pmd.   ------  Notes recorded by Laurine Blazer, LPN on 78/06/4233 at 36:14 AM EST No answer.  ------  Notes recorded by Herminio Commons, MD on 03/08/2018 at 4:27 PM EST The following labs are stable: Renal function has improved. All other results are normal or within acceptable limits. Medication changes / Follow up labs / Other changes or recommendations:  None. Kate Sable, MD 03/08/2018 4:27 PM

## 2018-03-18 DIAGNOSIS — J206 Acute bronchitis due to rhinovirus: Secondary | ICD-10-CM | POA: Diagnosis not present

## 2018-04-12 ENCOUNTER — Telehealth: Payer: Self-pay | Admitting: *Deleted

## 2018-04-12 NOTE — Telephone Encounter (Signed)
Please give pt call

## 2018-04-13 NOTE — Telephone Encounter (Signed)
Spoke with patient.  She wants to know if she can hold Eilquis 1 day before having permanent tattooing of her eyebrow.  Told pt 1 day is fine.

## 2018-04-15 ENCOUNTER — Inpatient Hospital Stay (HOSPITAL_COMMUNITY): Payer: Medicare Other | Attending: Hematology

## 2018-04-15 ENCOUNTER — Ambulatory Visit (HOSPITAL_COMMUNITY)
Admission: RE | Admit: 2018-04-15 | Discharge: 2018-04-15 | Disposition: A | Discharge status: Home / Self Care | Payer: Medicare Other | Source: Ambulatory Visit | Attending: Nurse Practitioner | Admitting: Nurse Practitioner

## 2018-04-15 DIAGNOSIS — Z170 Estrogen receptor positive status [ER+]: Secondary | ICD-10-CM | POA: Diagnosis not present

## 2018-04-15 DIAGNOSIS — Z7901 Long term (current) use of anticoagulants: Secondary | ICD-10-CM | POA: Diagnosis not present

## 2018-04-15 DIAGNOSIS — Z780 Asymptomatic menopausal state: Secondary | ICD-10-CM | POA: Diagnosis not present

## 2018-04-15 DIAGNOSIS — Z90710 Acquired absence of both cervix and uterus: Secondary | ICD-10-CM | POA: Diagnosis not present

## 2018-04-15 DIAGNOSIS — Z79811 Long term (current) use of aromatase inhibitors: Secondary | ICD-10-CM | POA: Diagnosis not present

## 2018-04-15 DIAGNOSIS — M8580 Other specified disorders of bone density and structure, unspecified site: Secondary | ICD-10-CM

## 2018-04-15 DIAGNOSIS — C50411 Malignant neoplasm of upper-outer quadrant of right female breast: Secondary | ICD-10-CM | POA: Diagnosis present

## 2018-04-15 DIAGNOSIS — M8589 Other specified disorders of bone density and structure, multiple sites: Secondary | ICD-10-CM | POA: Diagnosis not present

## 2018-04-15 DIAGNOSIS — Z79899 Other long term (current) drug therapy: Secondary | ICD-10-CM | POA: Insufficient documentation

## 2018-04-15 DIAGNOSIS — D638 Anemia in other chronic diseases classified elsewhere: Secondary | ICD-10-CM

## 2018-04-15 DIAGNOSIS — N189 Chronic kidney disease, unspecified: Secondary | ICD-10-CM | POA: Insufficient documentation

## 2018-04-15 DIAGNOSIS — D649 Anemia, unspecified: Secondary | ICD-10-CM | POA: Diagnosis not present

## 2018-04-15 DIAGNOSIS — I480 Paroxysmal atrial fibrillation: Secondary | ICD-10-CM | POA: Diagnosis not present

## 2018-04-15 DIAGNOSIS — I10 Essential (primary) hypertension: Secondary | ICD-10-CM | POA: Diagnosis not present

## 2018-04-15 LAB — COMPREHENSIVE METABOLIC PANEL
ALT: 17 U/L (ref 0–44)
AST: 17 U/L (ref 15–41)
Albumin: 4.2 g/dL (ref 3.5–5.0)
Alkaline Phosphatase: 97 U/L (ref 38–126)
Anion gap: 10 (ref 5–15)
BUN: 33 mg/dL — ABNORMAL HIGH (ref 8–23)
CO2: 26 mmol/L (ref 22–32)
Calcium: 9.7 mg/dL (ref 8.9–10.3)
Chloride: 104 mmol/L (ref 98–111)
Creatinine, Ser: 1.44 mg/dL — ABNORMAL HIGH (ref 0.44–1.00)
GFR calc Af Amer: 40 mL/min — ABNORMAL LOW (ref >60)
GFR calc non Af Amer: 35 mL/min — ABNORMAL LOW (ref >60)
Glucose, Bld: 97 mg/dL (ref 70–99)
Potassium: 4 mmol/L (ref 3.5–5.1)
Sodium: 140 mmol/L (ref 135–145)
Total Bilirubin: 0.4 mg/dL (ref 0.3–1.2)
Total Protein: 7.8 g/dL (ref 6.5–8.1)

## 2018-04-15 LAB — CBC WITH DIFFERENTIAL/PLATELET
Abs Immature Granulocytes: 0.03 10*3/uL (ref 0.00–0.07)
Basophils Absolute: 0.1 10*3/uL (ref 0.0–0.1)
Basophils Relative: 1 %
Eosinophils Absolute: 0.2 10*3/uL (ref 0.0–0.5)
Eosinophils Relative: 3 %
HCT: 39.9 % (ref 36.0–46.0)
Hemoglobin: 12.1 g/dL (ref 12.0–15.0)
Immature Granulocytes: 0 %
Lymphocytes Relative: 16 %
Lymphs Abs: 1.4 10*3/uL (ref 0.7–4.0)
MCH: 28.5 pg (ref 26.0–34.0)
MCHC: 30.3 g/dL (ref 30.0–36.0)
MCV: 94.1 fL (ref 80.0–100.0)
Monocytes Absolute: 0.6 10*3/uL (ref 0.1–1.0)
Monocytes Relative: 6 %
Neutro Abs: 6.5 10*3/uL (ref 1.7–7.7)
Neutrophils Relative %: 74 %
Platelets: 275 10*3/uL (ref 150–400)
RBC: 4.24 MIL/uL (ref 3.87–5.11)
RDW: 13.8 % (ref 11.5–15.5)
WBC: 8.8 10*3/uL (ref 4.0–10.5)
nRBC: 0 % (ref 0.0–0.2)

## 2018-04-15 LAB — VITAMIN B12: Vitamin B-12: 550 pg/mL (ref 180–914)

## 2018-04-15 LAB — FERRITIN: Ferritin: 78 ng/mL (ref 11–307)

## 2018-04-15 LAB — IRON AND TIBC
Iron: 33 ug/dL (ref 28–170)
Saturation Ratios: 10 % — ABNORMAL LOW (ref 10.4–31.8)
TIBC: 341 ug/dL (ref 250–450)
UIBC: 308 ug/dL

## 2018-04-15 LAB — FOLATE: Folate: 47.4 ng/mL (ref >5.9)

## 2018-04-22 ENCOUNTER — Other Ambulatory Visit: Payer: Self-pay

## 2018-04-22 ENCOUNTER — Encounter (HOSPITAL_COMMUNITY): Payer: Self-pay | Admitting: Hematology

## 2018-04-22 ENCOUNTER — Inpatient Hospital Stay (HOSPITAL_BASED_OUTPATIENT_CLINIC_OR_DEPARTMENT_OTHER): Payer: Medicare Other | Admitting: Hematology

## 2018-04-22 VITALS — BP 112/61 | HR 71 | Temp 98.7°F | Resp 16 | Wt 176.1 lb

## 2018-04-22 DIAGNOSIS — Z79899 Other long term (current) drug therapy: Secondary | ICD-10-CM | POA: Diagnosis not present

## 2018-04-22 DIAGNOSIS — C50411 Malignant neoplasm of upper-outer quadrant of right female breast: Secondary | ICD-10-CM

## 2018-04-22 DIAGNOSIS — N189 Chronic kidney disease, unspecified: Secondary | ICD-10-CM | POA: Diagnosis not present

## 2018-04-22 DIAGNOSIS — D649 Anemia, unspecified: Secondary | ICD-10-CM | POA: Diagnosis not present

## 2018-04-22 DIAGNOSIS — I480 Paroxysmal atrial fibrillation: Secondary | ICD-10-CM

## 2018-04-22 DIAGNOSIS — Z79811 Long term (current) use of aromatase inhibitors: Secondary | ICD-10-CM | POA: Diagnosis not present

## 2018-04-22 DIAGNOSIS — C50911 Malignant neoplasm of unspecified site of right female breast: Secondary | ICD-10-CM

## 2018-04-22 DIAGNOSIS — Z170 Estrogen receptor positive status [ER+]: Secondary | ICD-10-CM

## 2018-04-22 DIAGNOSIS — D638 Anemia in other chronic diseases classified elsewhere: Secondary | ICD-10-CM

## 2018-04-22 DIAGNOSIS — Z7901 Long term (current) use of anticoagulants: Secondary | ICD-10-CM | POA: Diagnosis not present

## 2018-04-22 DIAGNOSIS — I10 Essential (primary) hypertension: Secondary | ICD-10-CM

## 2018-04-22 DIAGNOSIS — M8580 Other specified disorders of bone density and structure, unspecified site: Secondary | ICD-10-CM

## 2018-04-22 DIAGNOSIS — Z90710 Acquired absence of both cervix and uterus: Secondary | ICD-10-CM

## 2018-04-22 NOTE — Progress Notes (Signed)
[place] [address], [place] [id number]   CLINIC:  Medical Oncology/Hematology  PCP:  [name], MD [place] [id number] [phone]   REASON FOR VISIT: Follow-up for right breast cancer stage IA, ER+/PR+/HER2- AND anemia  CURRENT THERAPY: Arimidex daily AND observation  BRIEF ONCOLOGIC HISTORY:    Lobular carcinoma of right breast ([place])   08/30/2014 Mammogram    Ill-defined shadowing mass at 10:00 position 6 cm from the nipple measuring [phone] cm in size without lymphadenopathy seen in the right axilla.    09/06/2014 Procedure    Biopsy of right breast mass at 10 o'clock position.    09/08/2014 Pathology Results    Invasive carcinoma, favor invasive ductal carcinoma.  Some features are suggestive of lobular carcinoma.  Negative e-cadherin immunostaining throughout the lesional cells supports the diagnosis of invasive lobular carcinoma.    09/08/2014 Pathology Results    ER 90%, PR 90%. HER2 FISH negative, Ki-67 26%.    09/20/2014 Definitive Surgery    Lumpectomy    09/20/2014 Pathology Results    Invasive lobular carcinoma, 0.6 cm in maximal dimension, closest margin is 0.3 cm at posterior resection margin. Grade 2. No LV I. 0/1 lymph nodes.    09/27/2014 -  Anti-estrogen oral therapy    Arimidex    08/08/2015 Mammogram    BIRADS 2    09/20/2015 Procedure    Right breast lumpectomy by Dr. Anthony Sar with sentinel lymph node biopsy.      CANCER STAGING: Cancer Staging Lobular carcinoma of right breast [facility]) Staging form: Breast, AJCC 7th Edition - Pathologic stage from 09/20/2014: Stage IA (T1b, N0, cM0) - Signed by Baird Cancer, PA-C on 01/15/2016    INTERVAL HISTORY:  Alicia Nash 77 y.o. female returns for routine follow-up for right breast cancer and anemia.  She denies any bleeding per rectum or melena.  After her iron infusion in August, her energy levels have improved.  She is tolerating anastrozole very well.   No major hot flashes or musculoskeletal pains.  Denies any fevers or infections or recent hospitalizations.  She is continuing to take calcium and vitamin D.  No new pains were reported.  Energy and appetite is 75%.   REVIEW OF SYSTEMS:  Review of Systems  All other systems reviewed and are negative.    PAST MEDICAL/SURGICAL HISTORY:  Past Medical History:  Diagnosis Date  . Arthritis   . Breast cancer ([place] [facility])   . Cellulitis    Right leg  . CHF (congestive heart failure) ([facility])   . Hyperlipidemia   . Hypertension   . Lobular carcinoma of right breast ([facility]) 09/27/2014  . Malignant neoplasm of upper-outer quadrant of right female breast ([place]) 09/27/2014  . Paroxysmal atrial fibrillation ([facility])   . Varicose veins    venous stasis skin changes   Past Surgical History:  Procedure Laterality Date  . ABDOMINAL HYSTERECTOMY    . CHOLECYSTECTOMY    . ESOPHAGOGASTRODUODENOSCOPY N/A 01/13/2018   Procedure: ESOPHAGOGASTRODUODENOSCOPY (EGD);  Surgeon: Rogene Houston, MD;  Location: [facility];  Service: Endoscopy;  Laterality: N/A;  . FOOT SURGERY     Multiple foot surgeries by Dr. Blanch Media  . GIVENS CAPSULE STUDY N/A 08/26/2017   Procedure: GIVENS CAPSULE STUDY;  Surgeon: Rogene Houston, MD;  Location: [facility];  Service: Endoscopy;  Laterality: N/A;  8:30  . JOINT REPLACEMENT     bilateral knee by Drs. McGee and [place]  .  PACEMAKER IMPLANT Left 05/12/2013   MDT Sherril Croon PPM implanted by Dr Dot Lanes for CHB and SSS  . SHOULDER SURGERY       SOCIAL HISTORY:  Social History   Socioeconomic History  . Marital status: Divorced    Spouse name: Not on file  . Number of children: Not on file  . Years of education: Not on file  . Highest education level: Not on file  Occupational History  . Not on file  Social Needs  . Financial resource strain: Not on file  . Food insecurity:    Worry: Not on file    Inability: Not on file  . Transportation needs:    Medical: Not on file     Non-medical: Not on file  Tobacco Use  . Smoking status: Never Smoker  . Smokeless tobacco: Never Used  Substance and Sexual Activity  . Alcohol use: No  . Drug use: No  . Sexual activity: Not on file  Lifestyle  . Physical activity:    Days per week: Not on file    Minutes per session: Not on file  . Stress: Not on file  Relationships  . Social connections:    Talks on phone: Not on file    Gets together: Not on file    Attends religious service: Not on file    Active member of club or organization: Not on file    Attends meetings of clubs or organizations: Not on file    Relationship status: Not on file  . Intimate partner violence:    Fear of current or ex partner: Not on file    Emotionally abused: Not on file    Physically abused: Not on file    Forced sexual activity: Not on file  Other Topics Concern  . Not on file  Social History Narrative   Lives in [place]   Divorced   Retired from [employer] HISTORY:  Family History  Problem Relation Age of Onset  . Hypertension Other   . Diabetes Mother     CURRENT MEDICATIONS:  Outpatient Encounter Medications as of 04/22/2018  Medication Sig  . acetaminophen (TYLENOL) 500 MG tablet Take 500 mg by mouth daily as needed for mild pain or moderate pain.  Marland Kitchen anastrozole (ARIMIDEX) 1 MG tablet Take 1 tablet (1 mg total) by mouth daily. (Patient taking differently: Take 1 mg by mouth every morning. )  . apixaban (ELIQUIS) 2.5 MG TABS tablet Take 1 tablet (2.5 mg total) by mouth 2 (two) times daily.  . benazepril (LOTENSIN) 40 MG tablet Take 1 tablet by mouth every morning.   . cetirizine (ZYRTEC) 10 MG tablet Take 10 mg by mouth daily as needed for allergies.   . citalopram (CELEXA) 20 MG tablet Take 20 mg by mouth every morning.   . Cranberry 400 MG CAPS Take 1 tablet by mouth every morning.   . diazepam (VALIUM) 5 MG tablet Take 1 tablet 2 (two) times daily by mouth.  . docusate sodium  (COLACE) 100 MG capsule Take 100 mg by mouth every morning.  . furosemide (LASIX) 40 MG tablet Take 1 tablet by mouth every morning.   Marland Kitchen gemfibrozil (LOPID) 600 MG tablet Take 1 tablet by mouth every morning.   . Multiple Vitamin (MULTIVITAMIN) tablet Take 1 tablet by mouth every morning.   . pantoprazole (PROTONIX) 40 MG tablet Take 1 tablet (40 mg total) by mouth daily.  Marland Kitchen ULORIC 40 MG tablet Take 1  tablet by mouth every morning.   . verapamil (VERELAN PM) 360 MG 24 hr capsule Take 360 mg by mouth every morning.   . vitamin C (ASCORBIC ACID) 500 MG tablet Take 500 mg by mouth every morning.    No facility-administered encounter medications on file as of 04/22/2018.     ALLERGIES:  Allergies  Allergen Reactions  . Dihydrocodeine Other (See Comments)    No reaction noted. "funny feeling"  . Other     Blisters after wear compression stockings     PHYSICAL EXAM:  ECOG Performance status: 1  I reviewed her vitals.  Blood pressure is 112/61 pulse rate is 71 respiratory 16 temperature is 98.7 saturations are 96%. Physical Exam HEENT: No thrush or mucositis. Chest: Bilateral clear to auscultation Cardiovascular S1-S2 regular rate and rhythm: Abdomen: No palpable hepatosplenomegaly. Breast exam: No palpable masses in bilateral breast.  Right breast upper outer quadrant scar is well-healed. Extremities: No edema or cyanosis.  LABORATORY DATA:  I have reviewed the labs as listed.  CBC    Component Value Date/Time   WBC 8.8 04/15/2018 1250   RBC 4.24 04/15/2018 1250   HGB 12.1 04/15/2018 1250   HCT 39.9 04/15/2018 1250   PLT 275 04/15/2018 1250   MCV 94.1 04/15/2018 1250   MCH 28.5 04/15/2018 1250   MCHC 30.3 04/15/2018 1250   RDW 13.8 04/15/2018 1250   LYMPHSABS 1.4 04/15/2018 1250   MONOABS 0.6 04/15/2018 1250   EOSABS 0.2 04/15/2018 1250   BASOSABS 0.1 04/15/2018 1250   CMP Latest Ref Rng & Units 04/15/2018 03/08/2018 01/14/2018  Glucose 70 - 99 mg/dL 97 87 -  BUN 8 - 23  mg/dL 33(H) 29(H) 23  Creatinine 0.44 - 1.00 mg/dL 1.44(H) 1.16(H) 1.45(H)  Sodium 135 - 145 mmol/L 140 139 -  Potassium 3.5 - 5.1 mmol/L 4.0 3.7 -  Chloride 98 - 111 mmol/L 104 104 -  CO2 22 - 32 mmol/L 26 26 -  Calcium 8.9 - 10.3 mg/dL 9.7 9.2 -  Total Protein 6.5 - 8.1 g/dL 7.8 - -  Total Bilirubin 0.3 - 1.2 mg/dL 0.4 - -  Alkaline Phos 38 - 126 U/L 97 - -  AST 15 - 41 U/L 17 - -  ALT 0 - 44 U/L 17 - -       DIAGNOSTIC IMAGING:  I have independently reviewed the scans and discussed with the patient.   I have reviewed Francene Finders, NP's note and agree with the documentation.  I personally performed a face-to-face visit, made revisions and my assessment and plan is as follows.    ASSESSMENT & PLAN:   Lobular carcinoma of right breast ([facility]) 1.  Stage I (PT 1 BP N0) right breast lobular carcinoma: -Status post lumpectomy on 09/20/2014, 0.6 cm, ER/PR positive, HER-2 negative by FISH, Ki-67 of 26%, Arimidex started on 09/27/2014.  For some reason she did not receive radiation therapy. - She is tolerating anastrozole without any hot flashes or musculoskeletal symptoms. -Today's physical examination shows right breast upper outer quadrant scar within normal limits.  No palpable masses bilaterally. - Her mammogram dated 08/18/2017 was BI-RADS Category 2 reviewed by me. - We will see her back in 4 months for follow-up.  I plan to arrange another mammogram in April.  2.  Normocytic anemia: -This is from combination of CKD and iron deficiency state. -She reportedly received 4 units of PRBC in March at [facility].  She had colonoscopy on 07/14/2017 at [facility] which  was normal. - A capsule study on 08/26/2017 by Dr. Laural Golden showed few small bowel punctate AVM with speck of blood, no active bleeding. -At last visit we have recommended Feraheme infusion which she received on 12/23/2017 and 12/30/2017. -She felt better after receiving infusions.  Her hemoglobin improved to 12.1.  Her  ferritin improved to 78 from 44.  Percent saturation was 10.  I have recommended 2 more infusions prior to her next visit in 4 months as it is likely to go down. -She is on a blood thinner and might be having occult bleeding.  3.  Osteopenia: - DEXA scan on 08/13/2015 shows T score of -1.7 in the left femoral neck.   - We reviewed the results of the DEXA scan dated 04/15/2018 which shows T score of -1.7 and stable.  She will continue calcium and vitamin D supplements.  She will also continue weightbearing exercises.      Orders placed this encounter:  Orders Placed This Encounter  Procedures  . MM Digital Diagnostic Bilat  . CBC with Differential  . Comprehensive metabolic panel  . Ferritin  . Iron and TIBC      Derek Jack, MD [place] [phone]

## 2018-04-22 NOTE — Assessment & Plan Note (Signed)
1.  Stage I (PT 1 BP N0) right breast lobular carcinoma: -Status post lumpectomy on 09/20/2014, 0.6 cm, ER/PR positive, HER-2 negative by FISH, Ki-67 of 26%, Arimidex started on 09/27/2014.  For some reason she did not receive radiation therapy. - She is tolerating anastrozole without any hot flashes or musculoskeletal symptoms. -Today's physical examination shows right breast upper outer quadrant scar within normal limits.  No palpable masses bilaterally. - Her mammogram dated 08/18/2017 was BI-RADS Category 2 reviewed by me. - We will see her back in 4 months for follow-up.  I plan to arrange another mammogram in April.  2.  Normocytic anemia: -This is from combination of CKD and iron deficiency state. -She reportedly received 4 units of PRBC in March at [facility].  She had colonoscopy on 07/14/2017 at [facility] which was normal. - A capsule study on 08/26/2017 by Dr. Laural Golden showed few small bowel punctate AVM with speck of blood, no active bleeding. -At last visit we have recommended Feraheme infusion which she received on 12/23/2017 and 12/30/2017. -She felt better after receiving infusions.  Her hemoglobin improved to 12.1.  Her ferritin improved to 78 from 44.  Percent saturation was 10.  I have recommended 2 more infusions prior to her next visit in 4 months as it is likely to go down. -She is on a blood thinner and might be having occult bleeding.  3.  Osteopenia: - DEXA scan on 08/13/2015 shows T score of -1.7 in the left femoral neck.   - We reviewed the results of the DEXA scan dated 04/15/2018 which shows T score of -1.7 and stable.  She will continue calcium and vitamin D supplements.  She will also continue weightbearing exercises.

## 2018-04-22 NOTE — Addendum Note (Signed)
Addended by: Glennie Isle on: 04/22/2018 04:05 PM   Modules accepted: Orders

## 2018-04-24 ENCOUNTER — Other Ambulatory Visit (HOSPITAL_COMMUNITY): Payer: Self-pay | Admitting: Hematology

## 2018-04-24 DIAGNOSIS — C50919 Malignant neoplasm of unspecified site of unspecified female breast: Secondary | ICD-10-CM

## 2018-04-26 DIAGNOSIS — J40 Bronchitis, not specified as acute or chronic: Secondary | ICD-10-CM | POA: Diagnosis not present

## 2018-05-07 ENCOUNTER — Inpatient Hospital Stay (HOSPITAL_COMMUNITY): Payer: Medicare Other | Attending: Hematology

## 2018-05-07 ENCOUNTER — Other Ambulatory Visit: Payer: Self-pay

## 2018-05-07 ENCOUNTER — Encounter (HOSPITAL_COMMUNITY): Payer: Self-pay

## 2018-05-07 VITALS — BP 130/59 | HR 60 | Temp 98.3°F | Resp 18

## 2018-05-07 DIAGNOSIS — C50411 Malignant neoplasm of upper-outer quadrant of right female breast: Secondary | ICD-10-CM | POA: Insufficient documentation

## 2018-05-07 DIAGNOSIS — Z170 Estrogen receptor positive status [ER+]: Secondary | ICD-10-CM | POA: Diagnosis not present

## 2018-05-07 DIAGNOSIS — D508 Other iron deficiency anemias: Secondary | ICD-10-CM

## 2018-05-07 DIAGNOSIS — D509 Iron deficiency anemia, unspecified: Secondary | ICD-10-CM | POA: Insufficient documentation

## 2018-05-07 MED ORDER — SODIUM CHLORIDE 0.9 % IV SOLN
510.0000 mg | Freq: Once | INTRAVENOUS | Status: AC
  Administered 2018-05-07: 510 mg via INTRAVENOUS
  Filled 2018-05-07: qty 17

## 2018-05-07 MED ORDER — SODIUM CHLORIDE 0.9 % IV SOLN
INTRAVENOUS | Status: AC
  Administered 2018-05-07: 15:00:00 via INTRAVENOUS

## 2018-05-07 NOTE — Progress Notes (Unsigned)
Tolerated infusion w/o adverse reaction.  Alert, in no distress.  VSS.  Discharged ambulatory in c/o spouse.

## 2018-05-14 ENCOUNTER — Encounter (HOSPITAL_COMMUNITY): Payer: Self-pay

## 2018-05-14 ENCOUNTER — Inpatient Hospital Stay (HOSPITAL_COMMUNITY): Payer: Medicare Other

## 2018-05-14 VITALS — BP 129/51 | HR 62 | Temp 98.0°F | Resp 18

## 2018-05-14 DIAGNOSIS — D508 Other iron deficiency anemias: Secondary | ICD-10-CM

## 2018-05-14 DIAGNOSIS — C50411 Malignant neoplasm of upper-outer quadrant of right female breast: Secondary | ICD-10-CM | POA: Diagnosis not present

## 2018-05-14 DIAGNOSIS — Z170 Estrogen receptor positive status [ER+]: Secondary | ICD-10-CM | POA: Diagnosis not present

## 2018-05-14 DIAGNOSIS — D509 Iron deficiency anemia, unspecified: Secondary | ICD-10-CM | POA: Diagnosis not present

## 2018-05-14 MED ORDER — SODIUM CHLORIDE 0.9% FLUSH
10.0000 mL | Freq: Once | INTRAVENOUS | Status: AC
  Administered 2018-05-14: 10 mL via INTRAVENOUS

## 2018-05-14 MED ORDER — SODIUM CHLORIDE 0.9 % IV SOLN
510.0000 mg | Freq: Once | INTRAVENOUS | Status: AC
  Administered 2018-05-14: 510 mg via INTRAVENOUS
  Filled 2018-05-14: qty 17

## 2018-05-14 MED ORDER — SODIUM CHLORIDE 0.9 % IV SOLN
INTRAVENOUS | Status: DC
  Administered 2018-05-14: 15:00:00 via INTRAVENOUS

## 2018-05-14 NOTE — Progress Notes (Signed)
Patient tolerated iron infusion with no complaints voiced.  Site clean and dry with no bruising or swelling noted.  Gauze and paper used per patients request.  Good blood return noted before and after infusion.  VSS with discharge and left with no s/s of distress noted.

## 2018-05-14 NOTE — Patient Instructions (Signed)
[place] at [facility]  Discharge Instructions:  _______________________________________________________________  Thank you for choosing [place] at [facility] to provide your oncology and hematology care.  To afford each patient quality time with our providers, please arrive at least 15 minutes before your scheduled appointment.  You need to re-schedule your appointment if you arrive 10 or more minutes late.  We strive to give you quality time with our providers, and arriving late affects you and other patients whose appointments are after yours.  Also, if you no show three or more times for appointments you may be dismissed from the clinic.  Again, thank you for choosing [place] at [place] hope is that these requests will allow you access to exceptional care and in a timely manner. _______________________________________________________________  If you have questions after your visit, please contact our office at [phone] between the hours of 8:30 a.m. and 5:00 p.m. Voicemails left after 4:30 p.m. will not be returned until the following business day. _______________________________________________________________  For prescription refill requests, have your pharmacy contact our office. _______________________________________________________________  Recommendations made by the consultant and any test results will be sent to your referring physician. _______________________________________________________________

## 2018-05-27 ENCOUNTER — Other Ambulatory Visit (HOSPITAL_COMMUNITY): Payer: Self-pay | Admitting: Emergency Medicine

## 2018-05-27 DIAGNOSIS — C50919 Malignant neoplasm of unspecified site of unspecified female breast: Secondary | ICD-10-CM

## 2018-05-27 MED ORDER — ANASTROZOLE 1 MG PO TABS: 1.0000 mg | ORAL_TABLET | Freq: Every day | ORAL | 5 refills | Status: DC

## 2018-07-26 DIAGNOSIS — E7849 Other hyperlipidemia: Secondary | ICD-10-CM | POA: Diagnosis not present

## 2018-07-26 DIAGNOSIS — K210 Gastro-esophageal reflux disease with esophagitis: Secondary | ICD-10-CM | POA: Diagnosis not present

## 2018-07-26 DIAGNOSIS — Z1389 Encounter for screening for other disorder: Secondary | ICD-10-CM | POA: Diagnosis not present

## 2018-07-26 DIAGNOSIS — Z0000 Encounter for general adult medical examination without abnormal findings: Secondary | ICD-10-CM | POA: Diagnosis not present

## 2018-07-26 DIAGNOSIS — J40 Bronchitis, not specified as acute or chronic: Secondary | ICD-10-CM | POA: Diagnosis not present

## 2018-07-26 DIAGNOSIS — I10 Essential (primary) hypertension: Secondary | ICD-10-CM | POA: Diagnosis not present

## 2018-07-28 ENCOUNTER — Ambulatory Visit (HOSPITAL_COMMUNITY): Payer: Medicare Other | Admitting: Hematology

## 2018-07-28 ENCOUNTER — Other Ambulatory Visit (HOSPITAL_COMMUNITY): Payer: Medicare Other

## 2018-08-18 ENCOUNTER — Other Ambulatory Visit (HOSPITAL_COMMUNITY): Payer: Self-pay | Admitting: Hematology

## 2018-08-18 DIAGNOSIS — D638 Anemia in other chronic diseases classified elsewhere: Secondary | ICD-10-CM

## 2018-08-18 DIAGNOSIS — C50911 Malignant neoplasm of unspecified site of right female breast: Secondary | ICD-10-CM

## 2018-08-24 ENCOUNTER — Ambulatory Visit (HOSPITAL_COMMUNITY): Payer: Medicare Other

## 2018-08-24 ENCOUNTER — Other Ambulatory Visit: Payer: Self-pay

## 2018-08-24 ENCOUNTER — Ambulatory Visit (HOSPITAL_COMMUNITY)
Admission: RE | Admit: 2018-08-24 | Discharge: 2018-08-24 | Disposition: A | Discharge status: Home / Self Care | Payer: Medicare Other | Source: Ambulatory Visit | Attending: Hematology | Admitting: Hematology

## 2018-08-24 ENCOUNTER — Inpatient Hospital Stay (HOSPITAL_COMMUNITY): Payer: Medicare Other | Attending: Hematology

## 2018-08-24 DIAGNOSIS — D638 Anemia in other chronic diseases classified elsewhere: Secondary | ICD-10-CM | POA: Diagnosis present

## 2018-08-24 DIAGNOSIS — Z853 Personal history of malignant neoplasm of breast: Secondary | ICD-10-CM | POA: Diagnosis not present

## 2018-08-24 DIAGNOSIS — Z79811 Long term (current) use of aromatase inhibitors: Secondary | ICD-10-CM | POA: Diagnosis not present

## 2018-08-24 DIAGNOSIS — Z170 Estrogen receptor positive status [ER+]: Secondary | ICD-10-CM | POA: Diagnosis not present

## 2018-08-24 DIAGNOSIS — C50911 Malignant neoplasm of unspecified site of right female breast: Secondary | ICD-10-CM

## 2018-08-24 DIAGNOSIS — C50411 Malignant neoplasm of upper-outer quadrant of right female breast: Secondary | ICD-10-CM | POA: Insufficient documentation

## 2018-08-24 LAB — COMPREHENSIVE METABOLIC PANEL
ALT: 12 U/L (ref 0–44)
AST: 16 U/L (ref 15–41)
Albumin: 3.9 g/dL (ref 3.5–5.0)
Alkaline Phosphatase: 100 U/L (ref 38–126)
Anion gap: 9 (ref 5–15)
BUN: 20 mg/dL (ref 8–23)
CO2: 30 mmol/L (ref 22–32)
Calcium: 9.7 mg/dL (ref 8.9–10.3)
Chloride: 105 mmol/L (ref 98–111)
Creatinine, Ser: 0.95 mg/dL (ref 0.44–1.00)
GFR calc Af Amer: >60 mL/min (ref >60)
GFR calc non Af Amer: 57 mL/min — ABNORMAL LOW (ref >60)
Glucose, Bld: 133 mg/dL — ABNORMAL HIGH (ref 70–99)
Potassium: 3.9 mmol/L (ref 3.5–5.1)
Sodium: 144 mmol/L (ref 135–145)
Total Bilirubin: 0.4 mg/dL (ref 0.3–1.2)
Total Protein: 7.2 g/dL (ref 6.5–8.1)

## 2018-08-24 LAB — CBC WITH DIFFERENTIAL/PLATELET
Abs Immature Granulocytes: 0.04 10*3/uL (ref 0.00–0.07)
Basophils Absolute: 0.1 10*3/uL (ref 0.0–0.1)
Basophils Relative: 1 %
Eosinophils Absolute: 0.3 10*3/uL (ref 0.0–0.5)
Eosinophils Relative: 4 %
HCT: 41.3 % (ref 36.0–46.0)
Hemoglobin: 13 g/dL (ref 12.0–15.0)
Immature Granulocytes: 1 %
Lymphocytes Relative: 18 %
Lymphs Abs: 1.3 10*3/uL (ref 0.7–4.0)
MCH: 30.7 pg (ref 26.0–34.0)
MCHC: 31.5 g/dL (ref 30.0–36.0)
MCV: 97.4 fL (ref 80.0–100.0)
Monocytes Absolute: 0.6 10*3/uL (ref 0.1–1.0)
Monocytes Relative: 8 %
Neutro Abs: 4.7 10*3/uL (ref 1.7–7.7)
Neutrophils Relative %: 68 %
Platelets: 261 10*3/uL (ref 150–400)
RBC: 4.24 MIL/uL (ref 3.87–5.11)
RDW: 12.6 % (ref 11.5–15.5)
WBC: 7 10*3/uL (ref 4.0–10.5)
nRBC: 0 % (ref 0.0–0.2)

## 2018-08-24 LAB — FERRITIN: Ferritin: 222 ng/mL (ref 11–307)

## 2018-08-24 LAB — IRON AND TIBC
Iron: 56 ug/dL (ref 28–170)
Saturation Ratios: 18 % (ref 10.4–31.8)
TIBC: 317 ug/dL (ref 250–450)
UIBC: 261 ug/dL

## 2018-08-25 ENCOUNTER — Ambulatory Visit (HOSPITAL_COMMUNITY): Payer: Medicare Other | Admitting: Hematology

## 2018-09-08 ENCOUNTER — Encounter (HOSPITAL_COMMUNITY): Payer: Self-pay | Admitting: Hematology

## 2018-09-08 ENCOUNTER — Inpatient Hospital Stay (HOSPITAL_COMMUNITY): Payer: Medicare Other | Attending: Hematology | Admitting: Hematology

## 2018-09-08 ENCOUNTER — Other Ambulatory Visit: Payer: Self-pay

## 2018-09-08 VITALS — BP 139/57 | HR 70 | Temp 98.4°F | Resp 18 | Wt 176.1 lb

## 2018-09-08 DIAGNOSIS — C50911 Malignant neoplasm of unspecified site of right female breast: Secondary | ICD-10-CM

## 2018-09-08 DIAGNOSIS — Z170 Estrogen receptor positive status [ER+]: Secondary | ICD-10-CM | POA: Diagnosis not present

## 2018-09-08 DIAGNOSIS — E785 Hyperlipidemia, unspecified: Secondary | ICD-10-CM | POA: Diagnosis not present

## 2018-09-08 DIAGNOSIS — Z7901 Long term (current) use of anticoagulants: Secondary | ICD-10-CM | POA: Insufficient documentation

## 2018-09-08 DIAGNOSIS — I480 Paroxysmal atrial fibrillation: Secondary | ICD-10-CM | POA: Insufficient documentation

## 2018-09-08 DIAGNOSIS — D649 Anemia, unspecified: Secondary | ICD-10-CM | POA: Insufficient documentation

## 2018-09-08 DIAGNOSIS — C50411 Malignant neoplasm of upper-outer quadrant of right female breast: Secondary | ICD-10-CM | POA: Insufficient documentation

## 2018-09-08 DIAGNOSIS — Z79899 Other long term (current) drug therapy: Secondary | ICD-10-CM | POA: Insufficient documentation

## 2018-09-08 DIAGNOSIS — C50919 Malignant neoplasm of unspecified site of unspecified female breast: Secondary | ICD-10-CM

## 2018-09-08 DIAGNOSIS — I10 Essential (primary) hypertension: Secondary | ICD-10-CM | POA: Insufficient documentation

## 2018-09-08 NOTE — Patient Instructions (Addendum)
[place] at [facility] Discharge Instructions  You were seen today by Dr. Delton Coombes. He went over your recent lab results. He did a manual exam and found no new lumps or bumps. He will see you back in 6 months for labs and follow up.   Thank you for choosing [place] at [facility] to provide your oncology and hematology care.  To afford each patient quality time with our provider, please arrive at least 15 minutes before your scheduled appointment time.   If you have a lab appointment with the [facility] please come in thru the  Main Entrance and check in at the main information desk  You need to re-schedule your appointment should you arrive 10 or more minutes late.  We strive to give you quality time with our providers, and arriving late affects you and other patients whose appointments are after yours.  Also, if you no show three or more times for appointments you may be dismissed from the clinic at the providers discretion.     Again, thank you for choosing [facility].  Our hope is that these requests will decrease the amount of time that you wait before being seen by our physicians.       _____________________________________________________________  Should you have questions after your visit to [facility], please contact our office at [phone] between the hours of 8:00 a.m. and 4:30 p.m.  Voicemails left after 4:00 p.m. will not be returned until the following business day.  For prescription refill requests, have your pharmacy contact our office and allow 72 hours.    Cancer Center Support Programs:   > Cancer Support Group  2nd Tuesday of the month 1pm-2pm, Journey Room

## 2018-09-08 NOTE — Progress Notes (Signed)
[place], [place] [address]   CLINIC:  Medical Oncology/Hematology  PCP:  [name], MD [place] [phone]   REASON FOR VISIT:  Follow-up for right breast cancer stage IA, ER+/PR+/HER2- AND anemia  CURRENT THERAPY:Arimidex daily AND observation    BRIEF ONCOLOGIC HISTORY:    Lobular carcinoma of right breast ([place])   08/30/2014 Mammogram    Ill-defined shadowing mass at 10:00 position 6 cm from the nipple measuring [phone] cm in size without lymphadenopathy seen in the right axilla.    09/06/2014 Procedure    Biopsy of right breast mass at 10 o'clock position.    09/08/2014 Pathology Results    Invasive carcinoma, favor invasive ductal carcinoma.  Some features are suggestive of lobular carcinoma.  Negative e-cadherin immunostaining throughout the lesional cells supports the diagnosis of invasive lobular carcinoma.    09/08/2014 Pathology Results    ER 90%, PR 90%. HER2 FISH negative, Ki-67 26%.    09/20/2014 Definitive Surgery    Lumpectomy    09/20/2014 Pathology Results    Invasive lobular carcinoma, 0.6 cm in maximal dimension, closest margin is 0.3 cm at posterior resection margin. Grade 2. No LV I. 0/1 lymph nodes.    09/27/2014 -  Anti-estrogen oral therapy    Arimidex    08/08/2015 Mammogram    BIRADS 2    09/20/2015 Procedure    Right breast lumpectomy by Dr. Anthony Sar with sentinel lymph node biopsy.      CANCER STAGING: Cancer Staging Lobular carcinoma of right breast [facility]) Staging form: Breast, AJCC 7th Edition - Pathologic stage from 09/20/2014: Stage IA (T1b, N0, cM0) - Signed by Baird Cancer, PA-C on 01/15/2016    INTERVAL HISTORY:  Ms. Heimann 78 y.o. female returns for routine follow-up. She is here today alone. She states that she has been feeling well since her last visit. She denies any new lumps or bumps in her breasts. Denies any nausea, vomiting, or diarrhea. Denies  any new pains. Had not noticed any recent bleeding such as epistaxis, hematuria or hematochezia. Denies recent chest pain on exertion, pre-syncopal episodes, or palpitations. Denies any numbness or tingling in hands or feet. Denies any recent fevers, infections, or recent hospitalizations. Patient reports appetite at 100% and energy level at 75%.    REVIEW OF SYSTEMS:  Review of Systems  Respiratory: Positive for shortness of breath.      PAST MEDICAL/SURGICAL HISTORY:  Past Medical History:  Diagnosis Date  . Arthritis   . Breast cancer ([place])   . Cellulitis    Right leg  . CHF (congestive heart failure) ([place])   . Hyperlipidemia   . Hypertension   . Lobular carcinoma of right breast ([place]) 09/27/2014  . Malignant neoplasm of upper-outer quadrant of right female breast ([place]) 09/27/2014  . Paroxysmal atrial fibrillation ([facility])   . Varicose veins    venous stasis skin changes   Past Surgical History:  Procedure Laterality Date  . ABDOMINAL HYSTERECTOMY    . CHOLECYSTECTOMY    . ESOPHAGOGASTRODUODENOSCOPY N/A 01/13/2018   Procedure: ESOPHAGOGASTRODUODENOSCOPY (EGD);  Surgeon: Rogene Houston, MD;  Location: [facility];  Service: Endoscopy;  Laterality: N/A;  . FOOT SURGERY     Multiple foot surgeries by Dr. Blanch Media  . GIVENS CAPSULE STUDY N/A 08/26/2017   Procedure: GIVENS CAPSULE STUDY;  Surgeon: Rogene Houston, MD;  Location: [facility] ENDO SUITE;  Service: Endoscopy;  Laterality: N/A;  8:30  . JOINT REPLACEMENT     bilateral knee by Drs. McGee and [place]  . PACEMAKER IMPLANT Left 05/12/2013   MDT Sherril Croon PPM implanted by Dr Dot Lanes for CHB and SSS  . SHOULDER SURGERY       SOCIAL HISTORY:  Social History   Socioeconomic History  . Marital status: Divorced    Spouse name: Not on file  . Number of children: Not on file  . Years of education: Not on file  . Highest education level: Not on file  Occupational History  . Not on file  Social Needs  . Financial resource strain: Not  on file  . Food insecurity:    Worry: Not on file    Inability: Not on file  . Transportation needs:    Medical: Not on file    Non-medical: Not on file  Tobacco Use  . Smoking status: Never Smoker  . Smokeless tobacco: Never Used  Substance and Sexual Activity  . Alcohol use: No  . Drug use: No  . Sexual activity: Not on file  Lifestyle  . Physical activity:    Days per week: Not on file    Minutes per session: Not on file  . Stress: Not on file  Relationships  . Social connections:    Talks on phone: Not on file    Gets together: Not on file    Attends religious service: Not on file    Active member of club or organization: Not on file    Attends meetings of clubs or organizations: Not on file    Relationship status: Not on file  . Intimate partner violence:    Fear of current or ex partner: Not on file    Emotionally abused: Not on file    Physically abused: Not on file    Forced sexual activity: Not on file  Other Topics Concern  . Not on file  Social History Narrative   Lives in [place]   Divorced   Retired from [employer] HISTORY:  Family History  Problem Relation Age of Onset  . Hypertension Other   . Diabetes Mother     CURRENT MEDICATIONS:  Outpatient Encounter Medications as of 09/08/2018  Medication Sig  . acetaminophen (TYLENOL) 500 MG tablet Take 500 mg by mouth daily as needed for mild pain or moderate pain.  Marland Kitchen anastrozole (ARIMIDEX) 1 MG tablet Take 1 tablet (1 mg total) by mouth daily.  Marland Kitchen apixaban (ELIQUIS) 2.5 MG TABS tablet Take 1 tablet (2.5 mg total) by mouth 2 (two) times daily.  . benazepril (LOTENSIN) 40 MG tablet Take 1 tablet by mouth every morning.   . cetirizine (ZYRTEC) 10 MG tablet Take 10 mg by mouth daily as needed for allergies.   . citalopram (CELEXA) 20 MG tablet Take 20 mg by mouth every morning.   . Cranberry 400 MG CAPS Take 1 tablet by mouth every morning.   . diazepam (VALIUM) 5 MG tablet Take  1 tablet 2 (two) times daily by mouth.  . docusate sodium (COLACE) 100 MG capsule Take 100 mg by mouth every morning.  . furosemide (LASIX) 40 MG tablet Take 1 tablet by mouth every morning.   Marland Kitchen gemfibrozil (LOPID) 600 MG tablet Take 1 tablet by mouth every morning.   . Multiple Vitamin (MULTIVITAMIN) tablet Take 1 tablet by mouth every morning.   . pantoprazole (PROTONIX) 40 MG tablet Take 1 tablet (40 mg total) by mouth daily.  Marland Kitchen  silver sulfADIAZINE (SILVADENE) 1 % cream Apply topically See admin instructions.  . triamcinolone cream (KENALOG) 0.1 % APPLY A THIN LAYER TO THE AFFECTED AREAS DAILY  . ULORIC 40 MG tablet Take 1 tablet by mouth every morning.   . verapamil (VERELAN PM) 360 MG 24 hr capsule Take 360 mg by mouth every morning.   . vitamin C (ASCORBIC ACID) 500 MG tablet Take 500 mg by mouth every morning.    Facility-Administered Encounter Medications as of 09/08/2018  Medication  . 0.9 %  sodium chloride infusion    ALLERGIES:  Allergies  Allergen Reactions  . Dihydrocodeine Other (See Comments)    No reaction noted. "funny feeling"  . Other     Blisters after wear compression stockings     PHYSICAL EXAM:  ECOG Performance status: 1  Vitals:   09/08/18 1131  BP: (!) 139/57  Pulse: 70  Resp: 18  Temp: 98.4 F (36.9 C)  SpO2: 94%   Filed Weights   09/08/18 1131  Weight: 176 lb 1.6 oz (79.9 kg)    Physical Exam Vitals signs reviewed.  Constitutional:      Appearance: Normal appearance.  Cardiovascular:     Rate and Rhythm: Normal rate and regular rhythm.     Heart sounds: Normal heart sounds.  Pulmonary:     Effort: Pulmonary effort is normal.     Breath sounds: Normal breath sounds.  Abdominal:     General: There is no distension.     Palpations: Abdomen is soft. There is no mass.  Musculoskeletal:        General: No swelling.  Skin:    General: Skin is warm.  Neurological:     General: No focal deficit present.     Mental Status: She is alert  and oriented to person, place, and time.  Psychiatric:        Mood and Affect: Mood normal.        Behavior: Behavior normal.      LABORATORY DATA:  I have reviewed the labs as listed.  CBC    Component Value Date/Time   WBC 7.0 08/24/2018 1132   RBC 4.24 08/24/2018 1132   HGB 13.0 08/24/2018 1132   HCT 41.3 08/24/2018 1132   PLT 261 08/24/2018 1132   MCV 97.4 08/24/2018 1132   MCH 30.7 08/24/2018 1132   MCHC 31.5 08/24/2018 1132   RDW 12.6 08/24/2018 1132   LYMPHSABS 1.3 08/24/2018 1132   MONOABS 0.6 08/24/2018 1132   EOSABS 0.3 08/24/2018 1132   BASOSABS 0.1 08/24/2018 1132   CMP Latest Ref Rng & Units 08/24/2018 04/15/2018 03/08/2018  Glucose 70 - 99 mg/dL 133(H) 97 87  BUN 8 - 23 mg/dL 20 33(H) 29(H)  Creatinine 0.44 - 1.00 mg/dL 0.95 1.44(H) 1.16(H)  Sodium 135 - 145 mmol/L 144 140 139  Potassium 3.5 - 5.1 mmol/L 3.9 4.0 3.7  Chloride 98 - 111 mmol/L 105 104 104  CO2 22 - 32 mmol/L _0 Calcium 8.9 - 10.3 mg/dL 9.7 9.7 9.2  Total Protein 6.5 - 8.1 g/dL 7.2 7.8 -  Total Bilirubin 0.3 - 1.2 mg/dL 0.4 0.4 -  Alkaline Phos 38 - 126 U/L 100 97 -  AST 15 - 41 U/L 16 17 -  ALT 0 - 44 U/L 12 17 -       DIAGNOSTIC IMAGING:  I have independently reviewed the scans and discussed with the patient.   I have reviewed Venita Lick LPN's note and  agree with the documentation.  I personally performed a face-to-face visit, made revisions and my assessment and plan is as follows.    ASSESSMENT & PLAN:   Lobular carcinoma of right breast ([facility]) 1.  Stage I (PT 1 BP N0) right breast lobular carcinoma: -Status post lumpectomy on 09/20/2014, 0.6 cm, ER/PR positive, HER-2 negative by FISH, Ki-67 of 26%, Arimidex started on 09/27/2014.  For some reason she did not receive radiation therapy. - She is continuing to tolerate anastrozole without any major problems. -Physical exam today shows right upper outer quadrant scar within normal limits.  No palpable masses bilaterally.  -We reviewed the reports of mammogram dated 08/24/2018, BI-RADS Category 2. -I will see her back in 6 months for follow-up.  2.  Normocytic anemia: -This is from combination of CKD and iron deficiency state. -She reportedly received 4 units of PRBC in March at [facility].  She had colonoscopy on 07/14/2017 at [facility] which was normal. - A capsule study on 08/26/2017 by Dr. Laural Golden showed few small bowel punctate AVM with speck of blood, no active bleeding. -Last Feraheme infusion was on 05/07/2018 and 05/14/2018. -She felt better after the infusions.  Hemoglobin today is 13.  Ferritin improved to 222 and percent saturation of 18. -She does not require any further intravenous iron.  Will check her labs again in 6 months for follow-up.  3.  Osteopenia: - DEXA scan on 08/13/2015 shows T score of -1.7 in the left femoral neck.   - DEXA scan on 04/15/2018 shows T score of -1.7 and stable. -She will continue calcium and vitamin D supplements.      Orders placed this encounter:  Orders Placed This Encounter  Procedures  . CBC with Differential/Platelet  . Comprehensive metabolic panel  . Iron and TIBC  . Ferritin      Derek Jack, MD [place] [phone]

## 2018-09-08 NOTE — Assessment & Plan Note (Signed)
1.  Stage I (PT 1 BP N0) right breast lobular carcinoma: -Status post lumpectomy on 09/20/2014, 0.6 cm, ER/PR positive, HER-2 negative by FISH, Ki-67 of 26%, Arimidex started on 09/27/2014.  For some reason she did not receive radiation therapy. - She is continuing to tolerate anastrozole without any major problems. -Physical exam today shows right upper outer quadrant scar within normal limits.  No palpable masses bilaterally. -We reviewed the reports of mammogram dated 08/24/2018, BI-RADS Category 2. -I will see her back in 6 months for follow-up.  2.  Normocytic anemia: -This is from combination of CKD and iron deficiency state. -She reportedly received 4 units of PRBC in March at [facility].  She had colonoscopy on 07/14/2017 at [facility] which was normal. - A capsule study on 08/26/2017 by Dr. Laural Golden showed few small bowel punctate AVM with speck of blood, no active bleeding. -Last Feraheme infusion was on 05/07/2018 and 05/14/2018. -She felt better after the infusions.  Hemoglobin today is 13.  Ferritin improved to 222 and percent saturation of 18. -She does not require any further intravenous iron.  Will check her labs again in 6 months for follow-up.  3.  Osteopenia: - DEXA scan on 08/13/2015 shows T score of -1.7 in the left femoral neck.   - DEXA scan on 04/15/2018 shows T score of -1.7 and stable. -She will continue calcium and vitamin D supplements.

## 2018-09-14 ENCOUNTER — Ambulatory Visit (INDEPENDENT_AMBULATORY_CARE_PROVIDER_SITE_OTHER): Payer: Medicare Other | Admitting: *Deleted

## 2018-09-14 ENCOUNTER — Other Ambulatory Visit: Payer: Self-pay

## 2018-09-14 DIAGNOSIS — Z5181 Encounter for therapeutic drug level monitoring: Secondary | ICD-10-CM

## 2018-09-14 DIAGNOSIS — I4891 Unspecified atrial fibrillation: Secondary | ICD-10-CM

## 2018-09-14 NOTE — Patient Instructions (Signed)
TELEPHONE VISIT DUE TO COVID 19:  Pt was changed to Alicia Nash 2.76m bid on 9/16 by Dr RLaural Goldendue to recurrent GI bleed. Xarelto was discontinued when pt was admitted to the hospital. Hx: PAF  Pt continues to do well since starting Alicia Nash.  She has not had any bleeding, excessive bruising or recurrent GI problems.  Reviewed patients medication list. Pt is notcurrently on any combined P-gp and strong CYP3A4 inhibitors/inducers (ketoconazole, traconazole, ritonavir, carbamazepine, phenytoin, rifampin, St. John's wort). [facility]. SCr 0.95 ,Weight79.9kg,CrCl61.64Dose isappropriate based on age, weight and SCr. Hgb and HCT: 13/41.3  Plats 261  A full discussion of the nature of anticoagulants has been carried out. A benefit/risk analysis has been presented to the patient, so that they understand the justification for choosing anticoagulation with Alicia Nash at this time. The need for compliance is stressed. Pt is aware to take the medication once daily with the largest meal of the day. Side effects of potential bleeding are discussed, including unusual colored urine or stools, coughing up blood or coffee ground emesis, nose bleeds or serious fall or head trauma. Discussed signs and symptoms of stroke. The patient should avoid any OTC items containing aspirin or ibuprofen. Avoid alcohol consumption. Call if any signs of abnormal bleeding. Discussed financial obligations and resolved any difficulty in obtaining medication. Next lab test in 649month  Labs are stable.   SCr and Hgb has improved.  6 month flollow up appointment made for 03/22/19 @ 10:00am.

## 2018-09-28 DIAGNOSIS — J0191 Acute recurrent sinusitis, unspecified: Secondary | ICD-10-CM | POA: Diagnosis not present

## 2018-09-28 DIAGNOSIS — M545 Low back pain: Secondary | ICD-10-CM | POA: Diagnosis not present

## 2018-10-13 DIAGNOSIS — J208 Acute bronchitis due to other specified organisms: Secondary | ICD-10-CM | POA: Diagnosis not present

## 2018-10-26 DIAGNOSIS — I10 Essential (primary) hypertension: Secondary | ICD-10-CM | POA: Diagnosis not present

## 2018-10-26 DIAGNOSIS — Z0000 Encounter for general adult medical examination without abnormal findings: Secondary | ICD-10-CM | POA: Diagnosis not present

## 2018-10-26 DIAGNOSIS — E785 Hyperlipidemia, unspecified: Secondary | ICD-10-CM | POA: Diagnosis not present

## 2018-10-26 DIAGNOSIS — K210 Gastro-esophageal reflux disease with esophagitis: Secondary | ICD-10-CM | POA: Diagnosis not present

## 2018-10-28 DIAGNOSIS — M85852 Other specified disorders of bone density and structure, left thigh: Secondary | ICD-10-CM | POA: Diagnosis not present

## 2018-10-28 DIAGNOSIS — M810 Age-related osteoporosis without current pathological fracture: Secondary | ICD-10-CM | POA: Diagnosis not present

## 2018-11-02 ENCOUNTER — Other Ambulatory Visit (HOSPITAL_COMMUNITY): Payer: Self-pay | Admitting: Hematology

## 2018-11-02 DIAGNOSIS — C50919 Malignant neoplasm of unspecified site of unspecified female breast: Secondary | ICD-10-CM

## 2018-11-22 DIAGNOSIS — M5136 Other intervertebral disc degeneration, lumbar region: Secondary | ICD-10-CM | POA: Diagnosis not present

## 2018-11-22 DIAGNOSIS — M545 Low back pain: Secondary | ICD-10-CM | POA: Diagnosis not present

## 2018-12-30 DIAGNOSIS — J40 Bronchitis, not specified as acute or chronic: Secondary | ICD-10-CM | POA: Diagnosis not present

## 2019-01-07 ENCOUNTER — Encounter: Payer: Medicare Other | Admitting: Internal Medicine

## 2019-03-10 ENCOUNTER — Inpatient Hospital Stay (HOSPITAL_COMMUNITY): Payer: Medicare Other

## 2019-03-14 ENCOUNTER — Inpatient Hospital Stay (HOSPITAL_COMMUNITY): Payer: Medicare Other | Attending: Hematology

## 2019-03-14 ENCOUNTER — Other Ambulatory Visit: Payer: Self-pay

## 2019-03-14 DIAGNOSIS — Z79899 Other long term (current) drug therapy: Secondary | ICD-10-CM | POA: Diagnosis not present

## 2019-03-14 DIAGNOSIS — Z170 Estrogen receptor positive status [ER+]: Secondary | ICD-10-CM | POA: Insufficient documentation

## 2019-03-14 DIAGNOSIS — I110 Hypertensive heart disease with heart failure: Secondary | ICD-10-CM | POA: Diagnosis not present

## 2019-03-14 DIAGNOSIS — M1990 Unspecified osteoarthritis, unspecified site: Secondary | ICD-10-CM | POA: Insufficient documentation

## 2019-03-14 DIAGNOSIS — I480 Paroxysmal atrial fibrillation: Secondary | ICD-10-CM | POA: Diagnosis not present

## 2019-03-14 DIAGNOSIS — C50411 Malignant neoplasm of upper-outer quadrant of right female breast: Secondary | ICD-10-CM | POA: Insufficient documentation

## 2019-03-14 DIAGNOSIS — C50919 Malignant neoplasm of unspecified site of unspecified female breast: Secondary | ICD-10-CM

## 2019-03-14 DIAGNOSIS — D649 Anemia, unspecified: Secondary | ICD-10-CM | POA: Diagnosis not present

## 2019-03-14 DIAGNOSIS — Z79811 Long term (current) use of aromatase inhibitors: Secondary | ICD-10-CM | POA: Insufficient documentation

## 2019-03-14 DIAGNOSIS — Z23 Encounter for immunization: Secondary | ICD-10-CM | POA: Diagnosis not present

## 2019-03-14 DIAGNOSIS — R200 Anesthesia of skin: Secondary | ICD-10-CM | POA: Insufficient documentation

## 2019-03-14 DIAGNOSIS — E785 Hyperlipidemia, unspecified: Secondary | ICD-10-CM | POA: Diagnosis not present

## 2019-03-14 LAB — COMPREHENSIVE METABOLIC PANEL
ALT: 14 U/L (ref 0–44)
AST: 18 U/L (ref 15–41)
Albumin: 3.9 g/dL (ref 3.5–5.0)
Alkaline Phosphatase: 92 U/L (ref 38–126)
Anion gap: 11 (ref 5–15)
BUN: 19 mg/dL (ref 8–23)
CO2: 30 mmol/L (ref 22–32)
Calcium: 9.2 mg/dL (ref 8.9–10.3)
Chloride: 101 mmol/L (ref 98–111)
Creatinine, Ser: 0.96 mg/dL (ref 0.44–1.00)
GFR calc Af Amer: >60 mL/min (ref >60)
GFR calc non Af Amer: 56 mL/min — ABNORMAL LOW (ref >60)
Glucose, Bld: 105 mg/dL — ABNORMAL HIGH (ref 70–99)
Potassium: 3.9 mmol/L (ref 3.5–5.1)
Sodium: 142 mmol/L (ref 135–145)
Total Bilirubin: 0.5 mg/dL (ref 0.3–1.2)
Total Protein: 7.2 g/dL (ref 6.5–8.1)

## 2019-03-14 LAB — CBC WITH DIFFERENTIAL/PLATELET
Abs Immature Granulocytes: 0.02 10*3/uL (ref 0.00–0.07)
Basophils Absolute: 0.1 10*3/uL (ref 0.0–0.1)
Basophils Relative: 1 %
Eosinophils Absolute: 0.3 10*3/uL (ref 0.0–0.5)
Eosinophils Relative: 5 %
HCT: 37.4 % (ref 36.0–46.0)
Hemoglobin: 11.7 g/dL — ABNORMAL LOW (ref 12.0–15.0)
Immature Granulocytes: 0 %
Lymphocytes Relative: 23 %
Lymphs Abs: 1.4 10*3/uL (ref 0.7–4.0)
MCH: 31.2 pg (ref 26.0–34.0)
MCHC: 31.3 g/dL (ref 30.0–36.0)
MCV: 99.7 fL (ref 80.0–100.0)
Monocytes Absolute: 0.5 10*3/uL (ref 0.1–1.0)
Monocytes Relative: 9 %
Neutro Abs: 3.6 10*3/uL (ref 1.7–7.7)
Neutrophils Relative %: 62 %
Platelets: 257 10*3/uL (ref 150–400)
RBC: 3.75 MIL/uL — ABNORMAL LOW (ref 3.87–5.11)
RDW: 13.2 % (ref 11.5–15.5)
WBC: 6 10*3/uL (ref 4.0–10.5)
nRBC: 0 % (ref 0.0–0.2)

## 2019-03-14 LAB — IRON AND TIBC
Iron: 39 ug/dL (ref 28–170)
Saturation Ratios: 11 % (ref 10.4–31.8)
TIBC: 348 ug/dL (ref 250–450)
UIBC: 309 ug/dL

## 2019-03-14 LAB — FERRITIN: Ferritin: 166 ng/mL (ref 11–307)

## 2019-03-15 DIAGNOSIS — I10 Essential (primary) hypertension: Secondary | ICD-10-CM | POA: Diagnosis not present

## 2019-03-15 DIAGNOSIS — E7849 Other hyperlipidemia: Secondary | ICD-10-CM | POA: Diagnosis not present

## 2019-03-15 DIAGNOSIS — K219 Gastro-esophageal reflux disease without esophagitis: Secondary | ICD-10-CM | POA: Diagnosis not present

## 2019-03-15 DIAGNOSIS — J440 Chronic obstructive pulmonary disease with acute lower respiratory infection: Secondary | ICD-10-CM | POA: Diagnosis not present

## 2019-03-17 ENCOUNTER — Ambulatory Visit (HOSPITAL_COMMUNITY): Payer: Medicare Other | Admitting: Hematology

## 2019-03-18 DIAGNOSIS — E7849 Other hyperlipidemia: Secondary | ICD-10-CM | POA: Diagnosis not present

## 2019-03-18 DIAGNOSIS — Z79899 Other long term (current) drug therapy: Secondary | ICD-10-CM | POA: Diagnosis not present

## 2019-03-21 ENCOUNTER — Other Ambulatory Visit (HOSPITAL_COMMUNITY): Payer: Self-pay | Admitting: Hematology

## 2019-03-21 ENCOUNTER — Inpatient Hospital Stay (HOSPITAL_BASED_OUTPATIENT_CLINIC_OR_DEPARTMENT_OTHER): Payer: Medicare Other | Admitting: Hematology

## 2019-03-21 ENCOUNTER — Other Ambulatory Visit: Payer: Self-pay

## 2019-03-21 ENCOUNTER — Encounter (HOSPITAL_COMMUNITY): Payer: Self-pay | Admitting: Hematology

## 2019-03-21 VITALS — BP 120/78 | HR 65 | Temp 97.1°F | Resp 16 | Wt 182.0 lb

## 2019-03-21 DIAGNOSIS — Z23 Encounter for immunization: Secondary | ICD-10-CM | POA: Diagnosis not present

## 2019-03-21 DIAGNOSIS — Z0000 Encounter for general adult medical examination without abnormal findings: Secondary | ICD-10-CM | POA: Diagnosis not present

## 2019-03-21 DIAGNOSIS — C50919 Malignant neoplasm of unspecified site of unspecified female breast: Secondary | ICD-10-CM

## 2019-03-21 DIAGNOSIS — I480 Paroxysmal atrial fibrillation: Secondary | ICD-10-CM | POA: Diagnosis not present

## 2019-03-21 DIAGNOSIS — M1990 Unspecified osteoarthritis, unspecified site: Secondary | ICD-10-CM | POA: Diagnosis not present

## 2019-03-21 DIAGNOSIS — R200 Anesthesia of skin: Secondary | ICD-10-CM | POA: Diagnosis not present

## 2019-03-21 DIAGNOSIS — E785 Hyperlipidemia, unspecified: Secondary | ICD-10-CM | POA: Diagnosis not present

## 2019-03-21 DIAGNOSIS — Z170 Estrogen receptor positive status [ER+]: Secondary | ICD-10-CM | POA: Diagnosis not present

## 2019-03-21 DIAGNOSIS — Z79899 Other long term (current) drug therapy: Secondary | ICD-10-CM | POA: Diagnosis not present

## 2019-03-21 DIAGNOSIS — D649 Anemia, unspecified: Secondary | ICD-10-CM | POA: Diagnosis not present

## 2019-03-21 DIAGNOSIS — C50911 Malignant neoplasm of unspecified site of right female breast: Secondary | ICD-10-CM

## 2019-03-21 DIAGNOSIS — I110 Hypertensive heart disease with heart failure: Secondary | ICD-10-CM | POA: Diagnosis not present

## 2019-03-21 DIAGNOSIS — Z79811 Long term (current) use of aromatase inhibitors: Secondary | ICD-10-CM | POA: Diagnosis not present

## 2019-03-21 DIAGNOSIS — C50411 Malignant neoplasm of upper-outer quadrant of right female breast: Secondary | ICD-10-CM | POA: Diagnosis not present

## 2019-03-21 MED ORDER — INFLUENZA VAC A&B SA ADJ QUAD 0.5 ML IM PRSY
0.5000 mL | PREFILLED_SYRINGE | Freq: Once | INTRAMUSCULAR | Status: AC
  Administered 2019-03-21: 0.5 mL via INTRAMUSCULAR
  Filled 2019-03-21: qty 0.5

## 2019-03-21 NOTE — Assessment & Plan Note (Signed)
1.  Stage I (PT 1 BP N0) right breast lobular carcinoma: -Status post lumpectomy on 09/20/2014, 0.6 cm, ER/PR positive, HER-2 negative by FISH, Ki-67 of 26%, Arimidex started on 09/27/2014.  For some reason she did not receive radiation therapy. -She is tolerating anastrozole very well without any major problems. -Physical exam reveals right upper outer quadrant lumpectomy scar is unchanged.  No palpable mass in the right breast. -Mammogram on 08/24/2018 was BI-RADS Category 2. -We will see her back in 6 months for follow-up.  We will schedule her mammogram in April.  2.  Normocytic anemia: -This is from combination of CKD and iron deficiency state. -She reportedly received 4 units of PRBC in March at [facility].  She had colonoscopy on 07/14/2017 at [facility] which was normal. - A capsule study on 08/26/2017 by Dr. Laural Golden showed few small bowel punctate AVM with speck of blood, no active bleeding. -Last Feraheme infusion was on 05/07/2018 and 05/14/2018. -We reviewed her most recent blood work.  Ferritin is 166, down from 222.  Percent saturation is 11 down from 18.  Creatinine is 0.9.  Hemoglobin is 11.7, down from 13.  Patient does not feel any worsening of tiredness. -We will see her back in 6 months and repeat anemia panel.  3.  Osteopenia: - DEXA scan on 08/13/2015 shows T score of -1.7 in the left femoral neck.   - DEXA scan on 04/15/2018 shows T score of -1.7 and stable. -She was told to continue calcium and vitamin D supplements.

## 2019-03-21 NOTE — Progress Notes (Signed)
Alicia Nash, [place] [address]   CLINIC:  Medical Oncology/Hematology  PCP:  [name], MD [place] [id number] [phone]   REASON FOR VISIT:  Follow-up for right breast cancer stage IA, ER+/PR+/HER2- AND anemia  CURRENT THERAPY:Arimidex daily AND observation    BRIEF ONCOLOGIC HISTORY:  Oncology History  Lobular carcinoma of right breast ([place])  08/30/2014 Mammogram   Ill-defined shadowing mass at 10:00 position 6 cm from the nipple measuring [phone] cm in size without lymphadenopathy seen in the right axilla.   09/06/2014 Procedure   Biopsy of right breast mass at 10 o'clock position.   09/08/2014 Pathology Results   Invasive carcinoma, favor invasive ductal carcinoma.  Some features are suggestive of lobular carcinoma.  Negative e-cadherin immunostaining throughout the lesional cells supports the diagnosis of invasive lobular carcinoma.   09/08/2014 Pathology Results   ER 90%, PR 90%. HER2 FISH negative, Ki-67 26%.   09/20/2014 Definitive Surgery   Lumpectomy   09/20/2014 Pathology Results   Invasive lobular carcinoma, 0.6 cm in maximal dimension, closest margin is 0.3 cm at posterior resection margin. Grade 2. No LV I. 0/1 lymph nodes.   09/27/2014 -  Anti-estrogen oral therapy   Arimidex   08/08/2015 Mammogram   BIRADS 2   09/20/2015 Procedure   Right breast lumpectomy by Dr. Anthony Sar with sentinel lymph node biopsy.      CANCER STAGING: Cancer Staging Lobular carcinoma of right breast [facility]) Staging form: Breast, AJCC 7th Edition - Pathologic stage from 09/20/2014: Stage IA (T1b, N0, cM0) - Signed by Baird Cancer, PA-C on 01/15/2016    INTERVAL HISTORY:  Alicia Nash 78 y.o. female seen for follow-up of anemia and right breast cancer.  She denies any bleeding per rectum or melena.  Denies any fevers, night sweats or weight loss.  Appetite is 100%.  Energy levels are 50%.  No pain is reported.  Leg  swellings are minimal.  Numbness in the hands at times has been stable.  Denies any recent ER visits or hospitalizations.  Continuing to take anastrozole without any problems.    REVIEW OF SYSTEMS:  Review of Systems  Respiratory: Positive for shortness of breath.   Neurological: Positive for numbness.     PAST MEDICAL/SURGICAL HISTORY:  Past Medical History:  Diagnosis Date  . Arthritis   . Breast cancer ([place])   . Cellulitis    Right leg  . CHF (congestive heart failure) ([place])   . Hyperlipidemia   . Hypertension   . Lobular carcinoma of right breast ([place]) 09/27/2014  . Malignant neoplasm of upper-outer quadrant of right female breast ([place]) 09/27/2014  . Paroxysmal atrial fibrillation ([facility])   . Varicose veins    venous stasis skin changes   Past Surgical History:  Procedure Laterality Date  . ABDOMINAL HYSTERECTOMY    . CHOLECYSTECTOMY    . ESOPHAGOGASTRODUODENOSCOPY N/A 01/13/2018   Procedure: ESOPHAGOGASTRODUODENOSCOPY (EGD);  Surgeon: Rogene Houston, MD;  [facility];  Service: Endoscopy;  Laterality: N/A;  . FOOT SURGERY     Multiple foot surgeries by Dr. Blanch Media  . GIVENS CAPSULE STUDY N/A 08/26/2017   Procedure: GIVENS CAPSULE STUDY;  Surgeon: Rogene Houston, MD;  Location: [facility] END[facility];  Service: Endoscopy;  Laterality: N/A;  8:30  . JOINT REPLACEMENT     bilateral knee by Drs. McGee and Ursa  . PACEMAKER IMPLANT Left 05/12/2013   MDT Sherril Croon PPM implanted by Dr  Kok for CHB and SSS  . SHOULDER SURGERY       SOCIAL HISTORY:  Social History   Socioeconomic History  . Marital status: Divorced    Spouse name: Not on file  . Number of children: Not on file  . Years of education: Not on file  . Highest education level: Not on file  Occupational History  . Not on file  Social Needs  . Financial resource strain: Not on file  . Food insecurity    Worry: Not on file    Inability: Not on file  . Transportation needs    Medical: Not on file     Non-medical: Not on file  Tobacco Use  . Smoking status: Never Smoker  . Smokeless tobacco: Never Used  Substance and Sexual Activity  . Alcohol use: No  . Drug use: No  . Sexual activity: Not on file  Lifestyle  . Physical activity    Days per week: Not on file    Minutes per session: Not on file  . Stress: Not on file  Relationships  . Social Herbalist on phone: Not on file    Gets together: Not on file    Attends religious service: Not on file    Active member of club or organization: Not on file    Attends meetings of clubs or organizations: Not on file    Relationship status: Not on file  . Intimate partner violence    Fear of current or ex partner: Not on file    Emotionally abused: Not on file    Physically abused: Not on file    Forced sexual activity: Not on file  Other Topics Concern  . Not on file  Social History Narrative   Lives in [place]   Divorced   Retired from [employer] HISTORY:  Family History  Problem Relation Age of Onset  . Hypertension Other   . Diabetes Mother     CURRENT MEDICATIONS:  Outpatient Encounter Medications as of 03/21/2019  Medication Sig  . anastrozole (ARIMIDEX) 1 MG tablet TAKE ONE TABLET BY MOUTH DAILY  . apixaban (ELIQUIS) 2.5 MG TABS tablet Take 1 tablet (2.5 mg total) by mouth 2 (two) times daily.  . benazepril (LOTENSIN) 40 MG tablet Take 1 tablet by mouth every morning.   . cetirizine (ZYRTEC) 10 MG tablet Take 10 mg by mouth daily as needed for allergies.   . citalopram (CELEXA) 20 MG tablet Take 20 mg by mouth every morning.   . Cranberry 400 MG CAPS Take 1 tablet by mouth every morning.   . diazepam (VALIUM) 5 MG tablet Take 1 tablet 2 (two) times daily by mouth.  . docusate sodium (COLACE) 100 MG capsule Take 100 mg by mouth every morning.  . furosemide (LASIX) 40 MG tablet Take 1 tablet by mouth every morning.   Marland Kitchen gemfibrozil (LOPID) 600 MG tablet Take 1 tablet by mouth  every morning.   . Multiple Vitamin (MULTIVITAMIN) tablet Take 1 tablet by mouth every morning.   . pantoprazole (PROTONIX) 40 MG tablet Take 1 tablet (40 mg total) by mouth daily.  . silver sulfADIAZINE (SILVADENE) 1 % cream Apply topically See admin instructions.  . triamcinolone cream (KENALOG) 0.1 % APPLY A THIN LAYER TO THE AFFECTED AREAS DAILY  . ULORIC 40 MG tablet Take 1 tablet by mouth every morning.   . verapamil (VERELAN PM) 360 MG 24 hr capsule Take 360  mg by mouth every morning.   . vitamin C (ASCORBIC ACID) 500 MG tablet Take 500 mg by mouth every morning.   Marland Kitchen acetaminophen (TYLENOL) 500 MG tablet Take 500 mg by mouth daily as needed for mild pain or moderate pain.   Facility-Administered Encounter Medications as of 03/21/2019  Medication  . 0.9 %  sodium chloride infusion  . [COMPLETED] influenza vaccine adjuvanted (FLUAD) injection 0.5 mL    ALLERGIES:  Allergies  Allergen Reactions  . Dihydrocodeine Other (See Comments)    No reaction noted. "funny feeling"  . Other     Blisters after wear compression stockings     PHYSICAL EXAM:  ECOG Performance status: 1  Vitals:   03/21/19 1419  BP: 120/78  Pulse: 65  Resp: 16  Temp: (!) 97.1 F (36.2 C)  SpO2: 98%   Filed Weights   03/21/19 1419  Weight: 182 lb (82.6 kg)    Physical Exam Vitals signs reviewed.  Constitutional:      Appearance: Normal appearance.  Cardiovascular:     Rate and Rhythm: Normal rate and regular rhythm.     Heart sounds: Normal heart sounds.  Pulmonary:     Effort: Pulmonary effort is normal.     Breath sounds: Normal breath sounds.  Abdominal:     General: There is no distension.     Palpations: Abdomen is soft. There is no mass.  Musculoskeletal:        General: No swelling.  Skin:    General: Skin is warm.  Neurological:     General: No focal deficit present.     Mental Status: She is alert and oriented to person, place, and time.  Psychiatric:        Mood and Affect:  Mood normal.        Behavior: Behavior normal.    Right breast lumpectomy in the upper outer quadrant scar is stable.  No palpable mass in bilateral breast.  No palpable adenopathy.  LABORATORY DATA:  I have reviewed the labs as listed.  CBC    Component Value Date/Time   WBC 6.0 03/14/2019 1452   RBC 3.75 (L) 03/14/2019 1452   HGB 11.7 (L) 03/14/2019 1452   HCT 37.4 03/14/2019 1452   PLT 257 03/14/2019 1452   MCV 99.7 03/14/2019 1452   MCH 31.2 03/14/2019 1452   MCHC 31.3 03/14/2019 1452   RDW 13.2 03/14/2019 1452   LYMPHSABS 1.4 03/14/2019 1452   MONOABS 0.5 03/14/2019 1452   EOSABS 0.3 03/14/2019 1452   BASOSABS 0.1 03/14/2019 1452   CMP Latest Ref Rng & Units 03/14/2019 08/24/2018 04/15/2018  Glucose 70 - 99 mg/dL 105(H) 133(H) 97  BUN 8 - 23 mg/dL 19 20 33(H)  Creatinine 0.44 - 1.00 mg/dL 0.96 0.95 1.44(H)  Sodium 135 - 145 mmol/L 142 144 140  Potassium 3.5 - 5.1 mmol/L 3.9 3.9 4.0  Chloride 98 - 111 mmol/L 101 105 104  CO2 22 - 32 mmol/L _0 Calcium 8.9 - 10.3 mg/dL 9.2 9.7 9.7  Total Protein 6.5 - 8.1 g/dL 7.2 7.2 7.8  Total Bilirubin 0.3 - 1.2 mg/dL 0.5 0.4 0.4  Alkaline Phos 38 - 126 U/L 92 100 97  AST 15 - 41 U/L _1 ALT 0 - 44 U/L _2 DIAGNOSTIC IMAGING:  I have independently reviewed the scans and discussed with the patient.   I have reviewed Alicia Lick LPN's note and agree  with the documentation.  I personally performed a face-to-face visit, made revisions and my assessment and plan is as follows.    ASSESSMENT & PLAN:   Lobular carcinoma of right breast (HCC) 1.  Stage I (PT 1 BP N0) right breast lobular carcinoma: -Status post lumpectomy on 09/20/2014, 0.6 cm, ER/PR positive, HER-2 negative by FISH, Ki-67 of 26%, Arimidex started on 09/27/2014.  For some reason she did not receive radiation therapy. -She is tolerating anastrozole very well without any major problems. -Physical exam reveals right upper outer quadrant  lumpectomy scar is unchanged.  No palpable mass in the right breast. -Mammogram on 08/24/2018 was BI-RADS Category 2. -We will see her back in 6 months for follow-up.  We will schedule her mammogram in April.  2.  Normocytic anemia: -This is from combination of CKD and iron deficiency state. -She reportedly received 4 units of PRBC in March at [facility].  She had colonoscopy on 07/14/2017 at [facility] which was normal. - A capsule study on 08/26/2017 by Dr. Laural Golden showed few small bowel punctate AVM with speck of blood, no active bleeding. -Last Feraheme infusion was on 05/07/2018 and 05/14/2018. -We reviewed her most recent blood work.  Ferritin is 166, down from 222.  Percent saturation is 11 down from 18.  Creatinine is 0.9.  Hemoglobin is 11.7, down from 13.  Patient does not feel any worsening of tiredness. -We will see her back in 6 months and repeat anemia panel.  3.  Osteopenia: - DEXA scan on 08/13/2015 shows T score of -1.7 in the left femoral neck.   - DEXA scan on 04/15/2018 shows T score of -1.7 and stable. -She was told to continue calcium and vitamin D supplements.      Orders placed this encounter:  Orders Placed This Encounter  Procedures  . MM DIAG BREAST TOMO BILATERAL  . CBC with Differential/Platelet  . Comprehensive metabolic panel  . Iron and TIBC  . Ferritin  . Vitamin B12  . Folate      Derek Jack, MD [place] [phone]

## 2019-03-21 NOTE — Patient Instructions (Signed)
[place] at [facility] Discharge Instructions  You were seen today by Dr. Delton Coombes. He went over your recent lab results. He will schedule you for your mammogram prior to your next visit. He will see you back in 6 months for labs and follow up.   Thank you for choosing [place] at [facility] to provide your oncology and hematology care.  To afford each patient quality time with our provider, please arrive at least 15 minutes before your scheduled appointment time.   If you have a lab appointment with the [facility] please come in thru the  Main Entrance and check in at the main information desk  You need to re-schedule your appointment should you arrive 10 or more minutes late.  We strive to give you quality time with our providers, and arriving late affects you and other patients whose appointments are after yours.  Also, if you no show three or more times for appointments you may be dismissed from the clinic at the providers discretion.     Again, thank you for choosing [facility].  Our hope is that these requests will decrease the amount of time that you wait before being seen by our physicians.       _____________________________________________________________  Should you have questions after your visit to [facility], please contact our office at [phone] between the hours of 8:00 a.m. and 4:30 p.m.  Voicemails left after 4:00 p.m. will not be returned until the following business day.  For prescription refill requests, have your pharmacy contact our office and allow 72 hours.    Cancer Center Support Programs:   > Cancer Support Group  2nd Tuesday of the month 1pm-2pm, Journey Room

## 2019-03-30 ENCOUNTER — Ambulatory Visit (INDEPENDENT_AMBULATORY_CARE_PROVIDER_SITE_OTHER): Payer: Medicare Other | Admitting: Internal Medicine

## 2019-03-30 ENCOUNTER — Other Ambulatory Visit: Payer: Self-pay

## 2019-03-30 ENCOUNTER — Encounter: Payer: Self-pay | Admitting: Internal Medicine

## 2019-03-30 VITALS — BP 130/64 | HR 65 | Ht 63.0 in | Wt 184.0 lb

## 2019-03-30 DIAGNOSIS — I480 Paroxysmal atrial fibrillation: Secondary | ICD-10-CM

## 2019-03-30 DIAGNOSIS — I442 Atrioventricular block, complete: Secondary | ICD-10-CM | POA: Diagnosis not present

## 2019-03-30 DIAGNOSIS — I10 Essential (primary) hypertension: Secondary | ICD-10-CM | POA: Diagnosis not present

## 2019-03-30 NOTE — Progress Notes (Signed)
PCP: [name], MD Primary Cardiologist: Dr Bronson Ing Primary EP:  Dr Franky Macho is a 78 y.o. female who presents today for routine electrophysiology followup.  Since last being seen in our clinic, the patient reports doing very well.  Today, she denies symptoms of palpitations, chest pain, shortness of breath,  lower extremity edema, dizziness, presyncope, or syncope.  The patient is otherwise without complaint today.   Past Medical History:  Diagnosis Date  . Arthritis   . Breast cancer ([place])   . Cellulitis    Right leg  . CHF (congestive heart failure) ([place])   . Hyperlipidemia   . Hypertension   . Lobular carcinoma of right breast ([place]) 09/27/2014  . Malignant neoplasm of upper-outer quadrant of right female breast ([place]) 09/27/2014  . Paroxysmal atrial fibrillation ([facility])   . Varicose veins    venous stasis skin changes   Past Surgical History:  Procedure Laterality Date  . ABDOMINAL HYSTERECTOMY    . CHOLECYSTECTOMY    . ESOPHAGOGASTRODUODENOSCOPY N/A 01/13/2018   Procedure: ESOPHAGOGASTRODUODENOSCOPY (EGD);  Surgeon: Rogene Houston, MD;  Location[facility];  Service: Endoscopy;  Laterality: N/A;  . FOOT SURGERY     Multiple foot surgeries by Dr. Blanch Media  . GIVENS CAPSULE STUDY N/A 08/26/2017   Procedure: GIVENS CAPSULE STUDY;  Surgeon: Rogene Houston, MD;  Location: [facility];  Service: Endoscopy;  Laterality: N/A;  8:30  . JOINT REPLACEMENT     bilateral knee by Drs. McGee and [place]  . PACEMAKER IMPLANT Left 05/12/2013   MDT Sherril Croon PPM implanted by Dr Dot Lanes for CHB and SSS  . SHOULDER SURGERY      ROS- all systems are reviewed and negative except as per HPI above  Current Outpatient Medications  Medication Sig Dispense Refill  . acetaminophen (TYLENOL) 500 MG tablet Take 500 mg by mouth daily as needed for mild pain or moderate pain.    Marland Kitchen anastrozole (ARIMIDEX) 1 MG tablet TAKE ONE TABLET BY MOUTH DAILY 30 tablet 5  . apixaban  (ELIQUIS) 2.5 MG TABS tablet Take 1 tablet (2.5 mg total) by mouth 2 (two) times daily. 60 tablet 1  . benazepril (LOTENSIN) 40 MG tablet Take 1 tablet by mouth every morning.     . cetirizine (ZYRTEC) 10 MG tablet Take 10 mg by mouth daily as needed for allergies.   0  . citalopram (CELEXA) 20 MG tablet Take 20 mg by mouth every morning.     . Cranberry 400 MG CAPS Take 1 tablet by mouth every morning.     . diazepam (VALIUM) 5 MG tablet Take 1 tablet 2 (two) times daily by mouth.    . docusate sodium (COLACE) 100 MG capsule Take 100 mg by mouth every morning.    . furosemide (LASIX) 40 MG tablet Take 1 tablet by mouth every morning.     Marland Kitchen gemfibrozil (LOPID) 600 MG tablet Take 1 tablet by mouth every morning.     . Multiple Vitamin (MULTIVITAMIN) tablet Take 1 tablet by mouth every morning.     . pantoprazole (PROTONIX) 40 MG tablet Take 1 tablet (40 mg total) by mouth daily. 30 tablet 4  . silver sulfADIAZINE (SILVADENE) 1 % cream Apply topically See admin instructions.    . triamcinolone cream (KENALOG) 0.1 % APPLY A THIN LAYER TO THE AFFECTED AREAS DAILY    . ULORIC 40 MG tablet Take 1 tablet by mouth every morning.     Marland Kitchen  verapamil (VERELAN PM) 360 MG 24 hr capsule Take 360 mg by mouth every morning.     . vitamin C (ASCORBIC ACID) 500 MG tablet Take 500 mg by mouth every morning.      No current facility-administered medications for this visit.    Facility-Administered Medications Ordered in Other Visits  Medication Dose Route Frequency Provider Last Rate Last Dose  . 0.9 %  sodium chloride infusion   Intravenous Continuous Derek Jack, MD 20 mL/hr at 05/07/18 1447      Physical Exam: Vitals:   03/30/19 1439  BP: 130/64  Pulse: 65  SpO2: 98%  Weight: 184 lb (83.5 kg)  Height: 5' 3" (1.6 m)    GEN- The patient is well appearing, alert and oriented x 3 today.   Head- normocephalic, atraumatic Eyes-  Sclera clear, conjunctiva pink Ears- hearing intact Oropharynx-  clear Lungs-   normal work of breathing Chest- pacemaker pocket is well healed Heart- Regular rate and rhythm  GI- soft  Extremities- no clubbing, cyanosis, or edema  Pacemaker interrogation- reviewed in detail today,  See PACEART report  Assessment and Plan:  1. Symptomatic sinus bradycardia and complete heart block Normal pacemaker function See Pace Art report No changes today she is not device dependant today  2. Paroxysmal atrial fibrillation afib burden 0.8% Now off amiodarone On xarelto  3. HTN Stable No change required today  Remotes Return to see me in a year  Thompson Grayer MD, [facility] 03/30/2019 3:09 PM

## 2019-03-30 NOTE — Patient Instructions (Signed)
Medication Instructions:  Continue all current medications.  Labwork: none  Testing/Procedures: none  Follow-Up: 1 year   Any Other Special Instructions Will Be Listed Below (If Applicable).  If you need a refill on your cardiac medications before your next appointment, please call your pharmacy.

## 2019-04-13 ENCOUNTER — Ambulatory Visit (INDEPENDENT_AMBULATORY_CARE_PROVIDER_SITE_OTHER): Payer: Medicare Other | Admitting: *Deleted

## 2019-04-13 ENCOUNTER — Other Ambulatory Visit: Payer: Self-pay

## 2019-04-13 DIAGNOSIS — Z5181 Encounter for therapeutic drug level monitoring: Secondary | ICD-10-CM

## 2019-04-13 DIAGNOSIS — I4891 Unspecified atrial fibrillation: Secondary | ICD-10-CM | POA: Diagnosis not present

## 2019-04-13 NOTE — Progress Notes (Signed)
Pt was changed from Xarelto to Eliquis 2.37m BID on 9/16 by Dr RLaural Goldenfor PAF after being hospitalized for GI bleed.   Pt has not had any bleeding or adverse effects since last visit May 2020.  Reviewed patients medication list.  Pt is not currently on any combined P-gp and strong CYP3A4 inhibitors/inducers (ketoconazole, traconazole, ritonavir, carbamazepine, phenytoin, rifampin, St. John's wort).  Reviewed labs from 03/14/19.  SCr 0.96, Weight 82.6kg, CrCl 61.96.  Dose is appropriate based on age, weight, and SCr.  Hgb and HCT 11.7/37.4  A full discussion of the nature of anticoagulants has been carried out.  A benefit/risk analysis has been presented to the patient, so that they understand the justification for choosing anticoagulation with Eliquis at this time.  The need for compliance is stressed.  Pt is aware to take the medication twice daily.  Side effects of potential bleeding are discussed, including unusual colored urine or stools, coughing up blood or coffee ground emesis, nose bleeds or serious fall or head trauma.  Discussed signs and symptoms of stroke. The patient should avoid any OTC items containing aspirin or ibuprofen.  Avoid alcohol consumption.   Call if any signs of abnormal bleeding.  Discussed financial obligations and resolved any difficulty in obtaining medication.  Next lab test in 6 months.   Appt made for June,2021

## 2019-05-12 ENCOUNTER — Other Ambulatory Visit (HOSPITAL_COMMUNITY): Payer: Self-pay | Admitting: Hematology

## 2019-05-12 DIAGNOSIS — C50919 Malignant neoplasm of unspecified site of unspecified female breast: Secondary | ICD-10-CM

## 2019-08-30 ENCOUNTER — Other Ambulatory Visit: Payer: Self-pay

## 2019-08-30 ENCOUNTER — Ambulatory Visit (HOSPITAL_COMMUNITY)
Admission: RE | Admit: 2019-08-30 | Discharge: 2019-08-30 | Disposition: A | Discharge status: Home / Self Care | Payer: Medicare HMO | Source: Ambulatory Visit | Attending: Hematology | Admitting: Hematology

## 2019-08-30 ENCOUNTER — Inpatient Hospital Stay (HOSPITAL_COMMUNITY): Payer: Medicare HMO | Attending: Hematology

## 2019-08-30 ENCOUNTER — Encounter (HOSPITAL_COMMUNITY): Payer: Medicare Other

## 2019-08-30 DIAGNOSIS — Z853 Personal history of malignant neoplasm of breast: Secondary | ICD-10-CM | POA: Diagnosis not present

## 2019-08-30 DIAGNOSIS — Z79811 Long term (current) use of aromatase inhibitors: Secondary | ICD-10-CM | POA: Diagnosis not present

## 2019-08-30 DIAGNOSIS — Z170 Estrogen receptor positive status [ER+]: Secondary | ICD-10-CM | POA: Diagnosis not present

## 2019-08-30 DIAGNOSIS — C50411 Malignant neoplasm of upper-outer quadrant of right female breast: Secondary | ICD-10-CM | POA: Insufficient documentation

## 2019-08-30 DIAGNOSIS — C50919 Malignant neoplasm of unspecified site of unspecified female breast: Secondary | ICD-10-CM

## 2019-08-30 DIAGNOSIS — R928 Other abnormal and inconclusive findings on diagnostic imaging of breast: Secondary | ICD-10-CM | POA: Diagnosis not present

## 2019-08-30 LAB — COMPREHENSIVE METABOLIC PANEL
ALT: 12 U/L (ref 0–44)
AST: 16 U/L (ref 15–41)
Albumin: 3.7 g/dL (ref 3.5–5.0)
Alkaline Phosphatase: 103 U/L (ref 38–126)
Anion gap: 12 (ref 5–15)
BUN: 35 mg/dL — ABNORMAL HIGH (ref 8–23)
CO2: 24 mmol/L (ref 22–32)
Calcium: 9.3 mg/dL (ref 8.9–10.3)
Chloride: 104 mmol/L (ref 98–111)
Creatinine, Ser: 1.39 mg/dL — ABNORMAL HIGH (ref 0.44–1.00)
GFR calc Af Amer: 42 mL/min — ABNORMAL LOW (ref >60)
GFR calc non Af Amer: 36 mL/min — ABNORMAL LOW (ref >60)
Glucose, Bld: 111 mg/dL — ABNORMAL HIGH (ref 70–99)
Potassium: 3.7 mmol/L (ref 3.5–5.1)
Sodium: 140 mmol/L (ref 135–145)
Total Bilirubin: 0.3 mg/dL (ref 0.3–1.2)
Total Protein: 7 g/dL (ref 6.5–8.1)

## 2019-08-30 LAB — FERRITIN: Ferritin: 140 ng/mL (ref 11–307)

## 2019-08-30 LAB — CBC WITH DIFFERENTIAL/PLATELET
Abs Immature Granulocytes: 0.03 10*3/uL (ref 0.00–0.07)
Basophils Absolute: 0.1 10*3/uL (ref 0.0–0.1)
Basophils Relative: 1 %
Eosinophils Absolute: 0.4 10*3/uL (ref 0.0–0.5)
Eosinophils Relative: 6 %
HCT: 34.7 % — ABNORMAL LOW (ref 36.0–46.0)
Hemoglobin: 10.9 g/dL — ABNORMAL LOW (ref 12.0–15.0)
Immature Granulocytes: 0 %
Lymphocytes Relative: 20 %
Lymphs Abs: 1.5 10*3/uL (ref 0.7–4.0)
MCH: 30.9 pg (ref 26.0–34.0)
MCHC: 31.4 g/dL (ref 30.0–36.0)
MCV: 98.3 fL (ref 80.0–100.0)
Monocytes Absolute: 0.7 10*3/uL (ref 0.1–1.0)
Monocytes Relative: 9 %
Neutro Abs: 4.8 10*3/uL (ref 1.7–7.7)
Neutrophils Relative %: 64 %
Platelets: 244 10*3/uL (ref 150–400)
RBC: 3.53 MIL/uL — ABNORMAL LOW (ref 3.87–5.11)
RDW: 14.7 % (ref 11.5–15.5)
WBC: 7.5 10*3/uL (ref 4.0–10.5)
nRBC: 0 % (ref 0.0–0.2)

## 2019-08-30 LAB — IRON AND TIBC
Iron: 35 ug/dL (ref 28–170)
Saturation Ratios: 10 % — ABNORMAL LOW (ref 10.4–31.8)
TIBC: 344 ug/dL (ref 250–450)
UIBC: 309 ug/dL

## 2019-08-30 LAB — VITAMIN B12: Vitamin B-12: 409 pg/mL (ref 180–914)

## 2019-08-30 LAB — FOLATE: Folate: 23.1 ng/mL (ref >5.9)

## 2019-09-01 DIAGNOSIS — R008 Other abnormalities of heart beat: Secondary | ICD-10-CM | POA: Diagnosis not present

## 2019-09-06 ENCOUNTER — Ambulatory Visit (HOSPITAL_COMMUNITY): Payer: Medicare Other | Admitting: Nurse Practitioner

## 2019-09-06 ENCOUNTER — Inpatient Hospital Stay (HOSPITAL_COMMUNITY): Payer: Medicare PPO | Attending: Nurse Practitioner | Admitting: Nurse Practitioner

## 2019-09-06 ENCOUNTER — Other Ambulatory Visit: Payer: Self-pay

## 2019-09-06 DIAGNOSIS — M1990 Unspecified osteoarthritis, unspecified site: Secondary | ICD-10-CM | POA: Diagnosis not present

## 2019-09-06 DIAGNOSIS — C50911 Malignant neoplasm of unspecified site of right female breast: Secondary | ICD-10-CM

## 2019-09-06 DIAGNOSIS — E785 Hyperlipidemia, unspecified: Secondary | ICD-10-CM | POA: Insufficient documentation

## 2019-09-06 DIAGNOSIS — Z170 Estrogen receptor positive status [ER+]: Secondary | ICD-10-CM | POA: Diagnosis not present

## 2019-09-06 DIAGNOSIS — C50411 Malignant neoplasm of upper-outer quadrant of right female breast: Secondary | ICD-10-CM | POA: Insufficient documentation

## 2019-09-06 DIAGNOSIS — M8580 Other specified disorders of bone density and structure, unspecified site: Secondary | ICD-10-CM | POA: Insufficient documentation

## 2019-09-06 DIAGNOSIS — I130 Hypertensive heart and chronic kidney disease with heart failure and stage 1 through stage 4 chronic kidney disease, or unspecified chronic kidney disease: Secondary | ICD-10-CM | POA: Insufficient documentation

## 2019-09-06 DIAGNOSIS — D509 Iron deficiency anemia, unspecified: Secondary | ICD-10-CM | POA: Insufficient documentation

## 2019-09-06 DIAGNOSIS — I480 Paroxysmal atrial fibrillation: Secondary | ICD-10-CM | POA: Diagnosis not present

## 2019-09-06 DIAGNOSIS — Z79899 Other long term (current) drug therapy: Secondary | ICD-10-CM | POA: Diagnosis not present

## 2019-09-06 DIAGNOSIS — Z7901 Long term (current) use of anticoagulants: Secondary | ICD-10-CM | POA: Diagnosis not present

## 2019-09-06 DIAGNOSIS — I509 Heart failure, unspecified: Secondary | ICD-10-CM | POA: Insufficient documentation

## 2019-09-06 DIAGNOSIS — Z79811 Long term (current) use of aromatase inhibitors: Secondary | ICD-10-CM | POA: Insufficient documentation

## 2019-09-06 NOTE — Progress Notes (Signed)
[place], [place] [id number]   CLINIC:  Medical Oncology/Hematology  PCP:  [name], MD [place] [phone]   REASON FOR VISIT: Follow-up for breast cancer and anemia  CURRENT THERAPY: Arimidex and intermittent iron infusions  BRIEF ONCOLOGIC HISTORY:  Oncology History  Lobular carcinoma of right breast ([place])  08/30/2014 Mammogram   Ill-defined shadowing mass at 10:00 position 6 cm from the nipple measuring [phone] cm in size without lymphadenopathy seen in the right axilla.   09/06/2014 Procedure   Biopsy of right breast mass at 10 o'clock position.   09/08/2014 Pathology Results   Invasive carcinoma, favor invasive ductal carcinoma.  Some features are suggestive of lobular carcinoma.  Negative e-cadherin immunostaining throughout the lesional cells supports the diagnosis of invasive lobular carcinoma.   09/08/2014 Pathology Results   ER 90%, PR 90%. HER2 FISH negative, Ki-67 26%.   09/20/2014 Definitive Surgery   Lumpectomy   09/20/2014 Pathology Results   Invasive lobular carcinoma, 0.6 cm in maximal dimension, closest margin is 0.3 cm at posterior resection margin. Grade 2. No LV I. 0/1 lymph nodes.   09/27/2014 -  Anti-estrogen oral therapy   Arimidex   08/08/2015 Mammogram   BIRADS 2   09/20/2015 Procedure   Right breast lumpectomy by Dr. Anthony Sar with sentinel lymph node biopsy.     CANCER STAGING: Cancer Staging Lobular carcinoma of right breast [facility]) Staging form: Breast, AJCC 7th Edition - Pathologic stage from 09/20/2014: Stage IA (T1b, N0, cM0) - Signed by Baird Cancer, PA-C on 01/15/2016    INTERVAL HISTORY:  Alicia Nash 79 y.o. female returns for routine follow-up for breast cancer and iron deficiency anemia.  Patient reports she is doing well since her last visit.  She is taking her anastrozole as prescribed and having no side effects.  She denies any bright red bleeding per  rectum or melena.  She denies any easy bruising or bleeding. Denies any nausea, vomiting, or diarrhea. Denies any new pains. Had not noticed any recent bleeding such as epistaxis, hematuria or hematochezia. Denies recent chest pain on exertion, shortness of breath on minimal exertion, pre-syncopal episodes, or palpitations. Denies any numbness or tingling in hands or feet. Denies any recent fevers, infections, or recent hospitalizations. Patient reports appetite at 100% and energy level at 50%.  She is eating well maintain her weight at this time.    REVIEW OF SYSTEMS:  Review of Systems  Constitutional: Positive for fatigue.  Respiratory: Positive for cough.   All other systems reviewed and are negative.    PAST MEDICAL/SURGICAL HISTORY:  Past Medical History:  Diagnosis Date  . Arthritis   . Breast cancer ([place])   . Cellulitis    Right leg  . CHF (congestive heart failure) ([place])   . Hyperlipidemia   . Hypertension   . Lobular carcinoma of right breast ([place]) 09/27/2014  . Malignant neoplasm of upper-outer quadrant of right female breast ([place]) 09/27/2014  . Paroxysmal atrial fibrillation ([facility])   . Varicose veins    venous stasis skin changes   Past Surgical History:  Procedure Laterality Date  . ABDOMINAL HYSTERECTOMY    . CHOLECYSTECTOMY    . ESOPHAGOGASTRODUODENOSCOPY N/A 01/13/2018   Procedure: ESOPHAGOGASTRODUODENOSCOPY (EGD);  Surgeon: Rogene Houston, MD;  Location[facility];  Service: Endoscopy;  Laterality: N/A;  . FOOT SURGERY     Multiple foot surgeries by Dr. Blanch Media  . GIVENS CAPSULE  STUDY N/A 08/26/2017   Procedure: GIVENS CAPSULE STUDY;  Surgeon: Rogene Houston, MD;  Location: [facility];  Service: Endoscopy;  Laterality: N/A;  8:30  . JOINT REPLACEMENT     bilateral knee by Drs. McGee and [place]  . PACEMAKER IMPLANT Left 05/12/2013   MDT Sherril Croon PPM implanted by Dr Dot Lanes for CHB and SSS  . SHOULDER SURGERY       SOCIAL HISTORY:  Social History    Socioeconomic History  . Marital status: Divorced    Spouse name: Not on file  . Number of children: Not on file  . Years of education: Not on file  . Highest education level: Not on file  Occupational History  . Not on file  Tobacco Use  . Smoking status: Never Smoker  . Smokeless tobacco: Never Used  Substance and Sexual Activity  . Alcohol use: No  . Drug use: No  . Sexual activity: Not on file  Other Topics Concern  . Not on file  Social History Narrative   Lives in [place]   Divorced   Retired from [employer] Strain:   . Difficulty of Paying Living Expenses:   Food Insecurity:   . Worried About Charity fundraiser in the Last Year:   . Arboriculturist in the Last Year:   Transportation Needs:   . Film/video editor (Medical):   Marland Kitchen Lack of Transportation (Non-Medical):   Physical Activity:   . Days of Exercise per Week:   . Minutes of Exercise per Session:   Stress:   . Feeling of Stress :   Social Connections:   . Frequency of Communication with Friends and Family:   . Frequency of Social Gatherings with Friends and Family:   . Attends Religious Services:   . Active Member of Clubs or Organizations:   . Attends Archivist Meetings:   Marland Kitchen Marital Status:   Intimate Partner Violence:   . Fear of Current or Ex-Partner:   . Emotionally Abused:   Marland Kitchen Physically Abused:   . Sexually Abused:     FAMILY HISTORY:  Family History  Problem Relation Age of Onset  . Hypertension Other   . Diabetes Mother     CURRENT MEDICATIONS:  Outpatient Encounter Medications as of 09/06/2019  Medication Sig  . anastrozole (ARIMIDEX) 1 MG tablet TAKE ONE TABLET BY MOUTH DAILY  . apixaban (ELIQUIS) 2.5 MG TABS tablet Take 1 tablet (2.5 mg total) by mouth 2 (two) times daily.  . benazepril (LOTENSIN) 40 MG tablet Take 1 tablet by mouth every morning.   . carvedilol (COREG) 6.25 MG tablet   .  citalopram (CELEXA) 20 MG tablet Take 20 mg by mouth every morning.   . Cranberry 400 MG CAPS Take 1 tablet by mouth every morning.   . diazepam (VALIUM) 5 MG tablet Take 1 tablet 2 (two) times daily by mouth.  . docusate sodium (COLACE) 100 MG capsule Take 100 mg by mouth every morning.  . furosemide (LASIX) 40 MG tablet Take 1 tablet by mouth every morning.   Marland Kitchen gemfibrozil (LOPID) 600 MG tablet Take 1 tablet by mouth every morning.   . Multiple Vitamin (MULTIVITAMIN) tablet Take 1 tablet by mouth every morning.   . pantoprazole (PROTONIX) 40 MG tablet Take 1 tablet (40 mg total) by mouth daily.  Marland Kitchen ULORIC 40 MG tablet Take 1 tablet by mouth every morning.   Marland Kitchen  verapamil (VERELAN PM) 360 MG 24 hr capsule Take 360 mg by mouth every morning.   . vitamin C (ASCORBIC ACID) 500 MG tablet Take 500 mg by mouth every morning.   Marland Kitchen acetaminophen (TYLENOL) 500 MG tablet Take 500 mg by mouth daily as needed for mild pain or moderate pain.  Marland Kitchen albuterol (VENTOLIN HFA) 108 (90 Base) MCG/ACT inhaler   . cetirizine (ZYRTEC) 10 MG tablet Take 10 mg by mouth daily as needed for allergies.   . silver sulfADIAZINE (SILVADENE) 1 % cream Apply topically See admin instructions.  . triamcinolone cream (KENALOG) 0.1 % APPLY A THIN LAYER TO THE AFFECTED AREAS DAILY   Facility-Administered Encounter Medications as of 09/06/2019  Medication  . 0.9 %  sodium chloride infusion    ALLERGIES:  Allergies  Allergen Reactions  . Dihydrocodeine Other (See Comments)    No reaction noted. "funny feeling"  . Other     Blisters after wear compression stockings     PHYSICAL EXAM:  ECOG Performance status: 1  Vitals:   09/06/19 1403  BP: (!) 108/45  Pulse: (!) 59  Resp: 17  Temp: (!) 97.3 F (36.3 C)  SpO2: 96%   Filed Weights   09/06/19 1403  Weight: 183 lb 11.2 oz (83.3 kg)   Physical Exam Constitutional:      Appearance: Normal appearance. She is normal weight.  Cardiovascular:     Rate and Rhythm: Normal  rate and regular rhythm.     Heart sounds: Normal heart sounds.  Pulmonary:     Effort: Pulmonary effort is normal.     Breath sounds: Normal breath sounds.  Abdominal:     General: Bowel sounds are normal.     Palpations: Abdomen is soft.  Musculoskeletal:        General: Normal range of motion.  Skin:    General: Skin is warm.  Neurological:     Mental Status: She is alert and oriented to person, place, and time. Mental status is at baseline.  Psychiatric:        Mood and Affect: Mood normal.        Behavior: Behavior normal.        Thought Content: Thought content normal.        Judgment: Judgment normal.   Breast: No palpable masses, no skin changes or nipple discharge, no adenopathy.  Lumpectomy scar unchanged.   LABORATORY DATA:  I have reviewed the labs as listed.  CBC    Component Value Date/Time   WBC 7.5 08/30/2019 1539   RBC 3.53 (L) 08/30/2019 1539   HGB 10.9 (L) 08/30/2019 1539   HCT 34.7 (L) 08/30/2019 1539   PLT 244 08/30/2019 1539   MCV 98.3 08/30/2019 1539   MCH 30.9 08/30/2019 1539   MCHC 31.4 08/30/2019 1539   RDW 14.7 08/30/2019 1539   LYMPHSABS 1.5 08/30/2019 1539   MONOABS 0.7 08/30/2019 1539   EOSABS 0.4 08/30/2019 1539   BASOSABS 0.1 08/30/2019 1539   CMP Latest Ref Rng & Units 08/30/2019 03/14/2019 08/24/2018  Glucose 70 - 99 mg/dL 111(H) 105(H) 133(H)  BUN 8 - 23 mg/dL 35(H) 19 20  Creatinine 0.44 - 1.00 mg/dL 1.39(H) 0.96 0.95  Sodium 135 - 145 mmol/L 140 142 144  Potassium 3.5 - 5.1 mmol/L 3.7 3.9 3.9  Chloride 98 - 111 mmol/L 104 101 105  CO2 22 - 32 mmol/L _0 Calcium 8.9 - 10.3 mg/dL 9.3 9.2 9.7  Total Protein 6.5 - 8.1 g/dL  7.0 7.2 7.2  Total Bilirubin 0.3 - 1.2 mg/dL 0.3 0.5 0.4  Alkaline Phos 38 - 126 U/L 103 92 100  AST 15 - 41 U/L _0 ALT 0 - 44 U/L _1 DIAGNOSTIC IMAGING:  I have independently reviewed the mammogram scans and discussed with the patient.  ASSESSMENT & PLAN:  Lobular carcinoma of right  breast ([place]) 1.  Stage I right breast lobular carcinoma: -Status post lumpectomy on 09/20/2014, 0.6 cm, ER/PR positive, HER-2 negative by FISH, Ki-67 of 26%. -For unknown reason she did not receive radiation therapy. -Arimidex was started on 09/27/2014.  Goal is 10 years.  She is tolerating well with no issues. -Physical examination reveals right upper outer quadrant lumpectomy scar which is unchanged.  No palpable masses in the right or left breast. -Last mammogram was done on 08/30/2019 which was B RADS category 2 benign. -Labs done on 08/30/2019 showed hemoglobin 10.9, WBC 7.5, platelets 244. -She will follow-up in 6 months with repeat labs.  2.  Normocytic anemia: -This is from a combination of CKD and iron deficiency state. -She reportedly received 2 units of PRBC in March at [facility]. -Colonoscopy on 07/14/2017 at [facility] which was normal. -Capsule study on 08/26/2017 by Dr. Laural Golden showed a few small bowel punctuate AVMs with specks of blood, no active bleeding. -Last Feraheme infusions was on 05/07/2018 and 05/14/2018. -Labs done on 08/27/2019 showed hemoglobin has dropped slightly to 10.9, ferritin 140, percent saturation 10 -We will set her up with 2 infusions of IV iron. -We will see her back in 6 months with repeat labs.  3.  Osteopenia: -Last DEXA scan on 08/13/2015 was a T score of -1.7 osteopenia -DEXA scan on 04/15/2018 showed T score of -1.7 and stable -She continues to take calcium and vitamin D daily.     Orders placed this encounter:  Orders Placed This Encounter  Procedures  . Lactate dehydrogenase  . CBC with Differential/Platelet  . Comprehensive metabolic panel  . Ferritin  . Iron and TIBC  . Vitamin B12  . VITAMIN D 25 Hydroxy (Vit-D Deficiency, Fractures)  . Folate    All questions were answered to patient's stated satisfaction. Encouraged patient to call with any new concerns or questions before his next visit to the cancer center and we can  certain see him sooner, if needed.     Francene Finders, FNP-C [place] [phone]

## 2019-09-06 NOTE — Patient Instructions (Signed)
[place] at [facility] Discharge Instructions  Follow up in 6 months with labs    Thank you for choosing [place] at [facility] to provide your oncology and hematology care.  To afford each patient quality time with our provider, please arrive at least 15 minutes before your scheduled appointment time.   If you have a lab appointment with the [facility] please come in thru the Main Entrance and check in at the main information desk.  You need to re-schedule your appointment should you arrive 10 or more minutes late.  We strive to give you quality time with our providers, and arriving late affects you and other patients whose appointments are after yours.  Also, if you no show three or more times for appointments you may be dismissed from the clinic at the providers discretion.     Again, thank you for choosing [facility].  Our hope is that these requests will decrease the amount of time that you wait before being seen by our physicians.       _____________________________________________________________  Should you have questions after your visit to [facility], please contact our office at [phone] between the hours of 8:00 a.m. and 4:30 p.m.  Voicemails left after 4:00 p.m. will not be returned until the following business day.  For prescription refill requests, have your pharmacy contact our office and allow 72 hours.    Due to Covid, you will need to wear a mask upon entering the hospital. If you do not have a mask, a mask will be given to you at the Main Entrance upon arrival. For doctor visits, patients may have 1 support person with them. For treatment visits, patients can not have anyone with them due to social distancing guidelines and our immunocompromised population.

## 2019-09-06 NOTE — Assessment & Plan Note (Signed)
1.  Stage I right breast lobular carcinoma: -Status post lumpectomy on 09/20/2014, 0.6 cm, ER/PR positive, HER-2 negative by FISH, Ki-67 of 26%. -For unknown reason she did not receive radiation therapy. -Arimidex was started on 09/27/2014.  Goal is 10 years.  She is tolerating well with no issues. -Physical examination reveals right upper outer quadrant lumpectomy scar which is unchanged.  No palpable masses in the right or left breast. -Last mammogram was done on 08/30/2019 which was B RADS category 2 benign. -Labs done on 08/30/2019 showed hemoglobin 10.9, WBC 7.5, platelets 244. -She will follow-up in 6 months with repeat labs.  2.  Normocytic anemia: -This is from a combination of CKD and iron deficiency state. -She reportedly received 2 units of PRBC in March at [facility]. -Colonoscopy on 07/14/2017 at [facility] which was normal. -Capsule study on 08/26/2017 by Dr. Laural Golden showed a few small bowel punctuate AVMs with specks of blood, no active bleeding. -Last Feraheme infusions was on 05/07/2018 and 05/14/2018. -Labs done on 08/27/2019 showed hemoglobin has dropped slightly to 10.9, ferritin 140, percent saturation 10 -We will set her up with 2 infusions of IV iron. -We will see her back in 6 months with repeat labs.  3.  Osteopenia: -Last DEXA scan on 08/13/2015 was a T score of -1.7 osteopenia -DEXA scan on 04/15/2018 showed T score of -1.7 and stable -She continues to take calcium and vitamin D daily.

## 2019-09-14 ENCOUNTER — Encounter (HOSPITAL_COMMUNITY): Payer: Self-pay

## 2019-09-14 ENCOUNTER — Inpatient Hospital Stay (HOSPITAL_COMMUNITY): Payer: Medicare PPO

## 2019-09-14 ENCOUNTER — Other Ambulatory Visit: Payer: Self-pay

## 2019-09-14 VITALS — BP 136/62 | HR 59 | Temp 97.7°F | Resp 18

## 2019-09-14 DIAGNOSIS — Z79811 Long term (current) use of aromatase inhibitors: Secondary | ICD-10-CM | POA: Diagnosis not present

## 2019-09-14 DIAGNOSIS — M8580 Other specified disorders of bone density and structure, unspecified site: Secondary | ICD-10-CM | POA: Diagnosis not present

## 2019-09-14 DIAGNOSIS — I480 Paroxysmal atrial fibrillation: Secondary | ICD-10-CM | POA: Diagnosis not present

## 2019-09-14 DIAGNOSIS — Z170 Estrogen receptor positive status [ER+]: Secondary | ICD-10-CM | POA: Diagnosis not present

## 2019-09-14 DIAGNOSIS — I130 Hypertensive heart and chronic kidney disease with heart failure and stage 1 through stage 4 chronic kidney disease, or unspecified chronic kidney disease: Secondary | ICD-10-CM | POA: Diagnosis not present

## 2019-09-14 DIAGNOSIS — E785 Hyperlipidemia, unspecified: Secondary | ICD-10-CM | POA: Diagnosis not present

## 2019-09-14 DIAGNOSIS — C50411 Malignant neoplasm of upper-outer quadrant of right female breast: Secondary | ICD-10-CM | POA: Diagnosis not present

## 2019-09-14 DIAGNOSIS — I509 Heart failure, unspecified: Secondary | ICD-10-CM | POA: Diagnosis not present

## 2019-09-14 DIAGNOSIS — D508 Other iron deficiency anemias: Secondary | ICD-10-CM

## 2019-09-14 DIAGNOSIS — D509 Iron deficiency anemia, unspecified: Secondary | ICD-10-CM | POA: Diagnosis not present

## 2019-09-14 MED ORDER — SODIUM CHLORIDE 0.9 % IV SOLN
510.0000 mg | Freq: Once | INTRAVENOUS | Status: AC
  Administered 2019-09-14: 14:00:00 510 mg via INTRAVENOUS
  Filled 2019-09-14: qty 510

## 2019-09-14 MED ORDER — SODIUM CHLORIDE 0.9 % IV SOLN: INTRAVENOUS | Status: DC

## 2019-09-14 NOTE — Progress Notes (Signed)
Iron infusion given per orders. Patient tolerated it well without problems. Vitals stable and discharged home from clinic ambulatory. Follow up as scheduled.

## 2019-09-14 NOTE — Patient Instructions (Signed)
[place] at [facility]  Discharge Instructions:   _______________________________________________________________  Thank you for choosing [place] at [facility] to provide your oncology and hematology care.  To afford each patient quality time with our providers, please arrive at least 15 minutes before your scheduled appointment.  You need to re-schedule your appointment if you arrive 10 or more minutes late.  We strive to give you quality time with our providers, and arriving late affects you and other patients whose appointments are after yours.  Also, if you no show three or more times for appointments you may be dismissed from the clinic.  Again, thank you for choosing [place] at [place] hope is that these requests will allow you access to exceptional care and in a timely manner. _______________________________________________________________  If you have questions after your visit, please contact our office at [phone] between the hours of 8:30 a.m. and 5:00 p.m. Voicemails left after 4:30 p.m. will not be returned until the following business day. _______________________________________________________________  For prescription refill requests, have your pharmacy contact our office. _______________________________________________________________  Recommendations made by the consultant and any test results will be sent to your referring physician. _______________________________________________________________

## 2019-09-19 DIAGNOSIS — Z6832 Body mass index (BMI) 32.0-32.9, adult: Secondary | ICD-10-CM | POA: Diagnosis not present

## 2019-09-19 DIAGNOSIS — J301 Allergic rhinitis due to pollen: Secondary | ICD-10-CM | POA: Diagnosis not present

## 2019-09-19 DIAGNOSIS — J4530 Mild persistent asthma, uncomplicated: Secondary | ICD-10-CM | POA: Diagnosis not present

## 2019-09-21 ENCOUNTER — Other Ambulatory Visit: Payer: Self-pay

## 2019-09-21 ENCOUNTER — Inpatient Hospital Stay (HOSPITAL_COMMUNITY): Payer: Medicare PPO

## 2019-09-21 VITALS — BP 127/57 | HR 59 | Temp 96.9°F | Resp 18

## 2019-09-21 DIAGNOSIS — D508 Other iron deficiency anemias: Secondary | ICD-10-CM

## 2019-09-21 DIAGNOSIS — Z170 Estrogen receptor positive status [ER+]: Secondary | ICD-10-CM | POA: Diagnosis not present

## 2019-09-21 DIAGNOSIS — C50411 Malignant neoplasm of upper-outer quadrant of right female breast: Secondary | ICD-10-CM | POA: Diagnosis not present

## 2019-09-21 DIAGNOSIS — D509 Iron deficiency anemia, unspecified: Secondary | ICD-10-CM | POA: Diagnosis not present

## 2019-09-21 DIAGNOSIS — I509 Heart failure, unspecified: Secondary | ICD-10-CM | POA: Diagnosis not present

## 2019-09-21 DIAGNOSIS — Z79811 Long term (current) use of aromatase inhibitors: Secondary | ICD-10-CM | POA: Diagnosis not present

## 2019-09-21 DIAGNOSIS — I130 Hypertensive heart and chronic kidney disease with heart failure and stage 1 through stage 4 chronic kidney disease, or unspecified chronic kidney disease: Secondary | ICD-10-CM | POA: Diagnosis not present

## 2019-09-21 DIAGNOSIS — M8580 Other specified disorders of bone density and structure, unspecified site: Secondary | ICD-10-CM | POA: Diagnosis not present

## 2019-09-21 DIAGNOSIS — I480 Paroxysmal atrial fibrillation: Secondary | ICD-10-CM | POA: Diagnosis not present

## 2019-09-21 DIAGNOSIS — E785 Hyperlipidemia, unspecified: Secondary | ICD-10-CM | POA: Diagnosis not present

## 2019-09-21 MED ORDER — SODIUM CHLORIDE 0.9 % IV SOLN
510.0000 mg | Freq: Once | INTRAVENOUS | Status: AC
  Administered 2019-09-21: 510 mg via INTRAVENOUS
  Filled 2019-09-21: qty 510

## 2019-09-21 MED ORDER — SODIUM CHLORIDE 0.9 % IV SOLN: INTRAVENOUS | Status: DC

## 2019-09-21 NOTE — Progress Notes (Signed)
Alicia Nash presents today for IV iron infusion. Infusion tolerated without incident or complaint. See MAR for details. VSS prior to and post infusion. Observed for 30 minutes post infusion. Discharged in satisfactory condition with follow up instructions.

## 2019-09-21 NOTE — Patient Instructions (Signed)
[place] at [facility]  Discharge Instructions:  IV iron received today. _______________________________________________________________  Thank you for choosing [place] at [facility] to provide your oncology and hematology care.  To afford each patient quality time with our providers, please arrive at least 15 minutes before your scheduled appointment.  You need to re-schedule your appointment if you arrive 10 or more minutes late.  We strive to give you quality time with our providers, and arriving late affects you and other patients whose appointments are after yours.  Also, if you no show three or more times for appointments you may be dismissed from the clinic.  Again, thank you for choosing [place] at [place] hope is that these requests will allow you access to exceptional care and in a timely manner. _______________________________________________________________  If you have questions after your visit, please contact our office at [phone] between the hours of 8:30 a.m. and 5:00 p.m. Voicemails left after 4:30 p.m. will not be returned until the following business day. _______________________________________________________________  For prescription refill requests, have your pharmacy contact our office. _______________________________________________________________  Recommendations made by the consultant and any test results will be sent to your referring physician. _______________________________________________________________

## 2019-10-12 ENCOUNTER — Ambulatory Visit (INDEPENDENT_AMBULATORY_CARE_PROVIDER_SITE_OTHER): Payer: Medicare PPO | Admitting: Pharmacist

## 2019-10-12 ENCOUNTER — Other Ambulatory Visit: Payer: Self-pay

## 2019-10-12 VITALS — Wt 181.0 lb

## 2019-10-12 DIAGNOSIS — I4891 Unspecified atrial fibrillation: Secondary | ICD-10-CM | POA: Diagnosis not present

## 2019-10-12 NOTE — Progress Notes (Signed)
Pt was started on lower dose of Eliquis 2.49m BID on 01/18/18 by Dr RLaural Goldendue to recurrent GI bleeding on Xarelto.  Reviewed patients medication list.  Pt is not currently on any combined P-gp and strong CYP3A4 inhibitors/inducers (ketoconazole, traconazole, ritonavir, carbamazepine, phenytoin, rifampin, St. John's wort).  Reviewed labs.  SCr 1.34, Weight 181 lbs. Dose inappropriate based on age, weight, and SCr, however pt has been maintained on lower dose of Eliquis due to recurrent GI bleeding on previous DOAC, will continue current dose.  Hgb and HCT have improved and WNL.  A full discussion of the nature of anticoagulants has been carried out.  A benefit/risk analysis has been presented to the patient, so that they understand the justification for choosing anticoagulation with Eliquis at this time.  The need for compliance is stressed.  Pt is aware to take the medication twice daily.  Side effects of potential bleeding are discussed, including unusual colored urine or stools, coughing up blood or coffee ground emesis, nose bleeds or serious fall or head trauma.  Discussed signs and symptoms of stroke. The patient should avoid any OTC items containing aspirin or ibuprofen.  Avoid alcohol consumption.   Call if any signs of abnormal bleeding.  Discussed financial obligations and resolved any difficulty in obtaining medication.    6/10 - labs have not resulted, doesn't show they were collected. Called pt and left message to confirm she had labs drawn yesterday.  6/14 - called pt 3x today, spoke with her husband who states she's available after 3pm today. Was still not home at 3:30 or 4:45pm. States to call before 8am tomorrow.  6/15 - called at 7:20am, pt states she has not gone for lab work yet. Plans to go this afternoon. Will await lab results.  6/15 - 4pm. Labs resulted and are stable. Will continue current/lower dose of Eliquis per Dr RLaural Goldenwho initially prescribed Eliquis due to history of  recurrent GI bleed. Left message for pt advising her to continue current meds as labs are stable.

## 2019-10-18 ENCOUNTER — Other Ambulatory Visit (HOSPITAL_COMMUNITY)
Admission: RE | Admit: 2019-10-18 | Discharge: 2019-10-18 | Disposition: A | Discharge status: Home / Self Care | Payer: Medicare PPO | Source: Ambulatory Visit | Attending: Internal Medicine | Admitting: Internal Medicine

## 2019-10-18 DIAGNOSIS — J4530 Mild persistent asthma, uncomplicated: Secondary | ICD-10-CM | POA: Diagnosis not present

## 2019-10-18 DIAGNOSIS — I4891 Unspecified atrial fibrillation: Secondary | ICD-10-CM | POA: Diagnosis not present

## 2019-10-18 DIAGNOSIS — J301 Allergic rhinitis due to pollen: Secondary | ICD-10-CM | POA: Diagnosis not present

## 2019-10-18 DIAGNOSIS — Z6832 Body mass index (BMI) 32.0-32.9, adult: Secondary | ICD-10-CM | POA: Diagnosis not present

## 2019-10-18 DIAGNOSIS — M1710 Unilateral primary osteoarthritis, unspecified knee: Secondary | ICD-10-CM | POA: Diagnosis not present

## 2019-10-18 LAB — BASIC METABOLIC PANEL
Anion gap: 12 (ref 5–15)
BUN: 33 mg/dL — ABNORMAL HIGH (ref 8–23)
CO2: 26 mmol/L (ref 22–32)
Calcium: 9.6 mg/dL (ref 8.9–10.3)
Chloride: 103 mmol/L (ref 98–111)
Creatinine, Ser: 1.34 mg/dL — ABNORMAL HIGH (ref 0.44–1.00)
GFR calc Af Amer: 43 mL/min — ABNORMAL LOW (ref >60)
GFR calc non Af Amer: 37 mL/min — ABNORMAL LOW (ref >60)
Glucose, Bld: 98 mg/dL (ref 70–99)
Potassium: 4 mmol/L (ref 3.5–5.1)
Sodium: 141 mmol/L (ref 135–145)

## 2019-10-18 LAB — CBC
HCT: 40.3 % (ref 36.0–46.0)
Hemoglobin: 12.5 g/dL (ref 12.0–15.0)
MCH: 31.3 pg (ref 26.0–34.0)
MCHC: 31 g/dL (ref 30.0–36.0)
MCV: 100.8 fL — ABNORMAL HIGH (ref 80.0–100.0)
Platelets: 260 10*3/uL (ref 150–400)
RBC: 4 MIL/uL (ref 3.87–5.11)
RDW: 14.4 % (ref 11.5–15.5)
WBC: 7.5 10*3/uL (ref 4.0–10.5)
nRBC: 0 % (ref 0.0–0.2)

## 2019-12-12 ENCOUNTER — Other Ambulatory Visit (HOSPITAL_COMMUNITY): Payer: Self-pay | Admitting: Nurse Practitioner

## 2019-12-12 DIAGNOSIS — C50919 Malignant neoplasm of unspecified site of unspecified female breast: Secondary | ICD-10-CM

## 2020-01-03 ENCOUNTER — Encounter: Payer: Self-pay | Admitting: Family Medicine

## 2020-01-03 ENCOUNTER — Ambulatory Visit (INDEPENDENT_AMBULATORY_CARE_PROVIDER_SITE_OTHER): Payer: Medicare PPO | Admitting: Family Medicine

## 2020-01-03 VITALS — BP 128/64 | HR 61 | Ht 63.0 in | Wt 178.0 lb

## 2020-01-03 DIAGNOSIS — R600 Localized edema: Secondary | ICD-10-CM

## 2020-01-03 DIAGNOSIS — I480 Paroxysmal atrial fibrillation: Secondary | ICD-10-CM

## 2020-01-03 DIAGNOSIS — Z950 Presence of cardiac pacemaker: Secondary | ICD-10-CM | POA: Diagnosis not present

## 2020-01-03 DIAGNOSIS — I10 Essential (primary) hypertension: Secondary | ICD-10-CM

## 2020-01-03 NOTE — Patient Instructions (Signed)
Your physician wants you to follow-up in: [place] will receive a reminder letter in the mail two months in advance. If you don't receive a letter, please call our office to schedule the follow-up appointment.  Your physician recommends that you continue on your current medications as directed. Please refer to the Current Medication list given to you today.  Thank you for choosing [place]!!

## 2020-01-03 NOTE — Progress Notes (Signed)
Cardiology Office Note  Date: 01/03/2020   ID: Alicia Nash, Alicia Nash 1940/08/25, MRN [id number]  PCP:  [name], MD  Cardiologist:  Kate Sable, MD (Inactive) Electrophysiologist:  Thompson Grayer, MD   Chief Complaint: History of complete heart block,  History of Present Illness: Alicia Nash is a 79 y.o. female with a history of congestive heart failure, HLD, HTN, PAF, complete heart block.  Last saw Dr. Rayann Heman on 03/30/2019 for routine electrophysiology follow-up.  She reported doing very well.  She denied any symptoms of palpitations, chest pain, shortness of breath, lower extremity edema, dizziness, presyncope or syncope.  Regarding her symptomatic sinus bradycardia and complete heart block she had normal pacemaker function.  Atrial fibrillation burden was 0.8%.  She was now off of amiodarone and continued on Xarelto.  Her blood pressure was stable.  Patient is here for 1 year follow-up.  Patient states in the interim since last visit she had pneumonia with a hospital stay for 7 days in March of this year.  States she has had some issues with significant anemia.  States she has had recent blood transfusions over the last year for anemia as well as iron infusions.  Recent labs showed CBC with hemoglobin of 12.5 and hematocrit of 40.3 on 10/18/2019.  States she has had some mild lower extremity edema for which she takes furosemide 40 mg daily.  She states he plans to see her primary care provider soon regarding the lower extremity edema.  Denies any anginal or exertional symptoms, palpitations or arrhythmias, orthostatic symptoms, CVA or TIA-like symptoms, PND or orthopnea, weight gain.  Past Medical History:  Diagnosis Date  . Arthritis   . Breast cancer ([place])   . Cellulitis    Right leg  . CHF (congestive heart failure) ([place])   . Hyperlipidemia   . Hypertension   . Lobular carcinoma of right breast ([employer] [place]) 09/27/2014  . Malignant neoplasm of upper-outer quadrant of  right female breast ([place]) 09/27/2014  . Paroxysmal atrial fibrillation ([facility])   . Varicose veins    venous stasis skin changes    Past Surgical History:  Procedure Laterality Date  . ABDOMINAL HYSTERECTOMY    . CHOLECYSTECTOMY    . ESOPHAGOGASTRODUODENOSCOPY N/A 01/13/2018   Procedure: ESOPHAGOGASTRODUODENOSCOPY (EGD);  Surgeon: Rogene Houston, MD;  Location[facility];  Service: Endoscopy;  Laterality: N/A;  . FOOT SURGERY     Multiple foot surgeries by Dr. Blanch Media  . GIVENS CAPSULE STUDY N/A 08/26/2017   Procedure: GIVENS CAPSULE STUDY;  Surgeon: Rogene Houston, MD;  Location: [facility];  Service: Endoscopy;  Laterality: N/A;  8:30  . JOINT REPLACEMENT     bilateral knee by Drs. McGee and [place]  . PACEMAKER IMPLANT Left 05/12/2013   MDT Sherril Croon PPM implanted by Dr Dot Lanes for CHB and SSS  . SHOULDER SURGERY      Current Outpatient Medications  Medication Sig Dispense Refill  . acetaminophen (TYLENOL) 500 MG tablet Take 500 mg by mouth daily as needed for mild pain or moderate pain.    Marland Kitchen albuterol (VENTOLIN HFA) 108 (90 Base) MCG/ACT inhaler     . anastrozole (ARIMIDEX) 1 MG tablet TAKE ONE TABLET BY MOUTH DAILY 30 tablet 5  . apixaban (ELIQUIS) 2.5 MG TABS tablet Take 1 tablet (2.5 mg total) by mouth 2 (two) times daily. 60 tablet 1  . benazepril (LOTENSIN) 40 MG tablet Take 1 tablet by mouth every morning.     . carvedilol (  COREG) 6.25 MG tablet     . cetirizine (ZYRTEC) 10 MG tablet Take 10 mg by mouth daily as needed for allergies.   0  . citalopram (CELEXA) 20 MG tablet Take 20 mg by mouth every morning.     . Cranberry 400 MG CAPS Take 1 tablet by mouth every morning.     . diazepam (VALIUM) 5 MG tablet Take 1 tablet 2 (two) times daily by mouth.    . docusate sodium (COLACE) 100 MG capsule Take 100 mg by mouth every morning.    . furosemide (LASIX) 40 MG tablet Take 1 tablet by mouth every morning.     Marland Kitchen gemfibrozil (LOPID) 600 MG tablet Take 1 tablet by mouth every  morning.     . Multiple Vitamin (MULTIVITAMIN) tablet Take 1 tablet by mouth every morning.     . pantoprazole (PROTONIX) 40 MG tablet Take 1 tablet (40 mg total) by mouth daily. 30 tablet 4  . PNEUMOVAX 23 25 MCG/0.5ML injection     . silver sulfADIAZINE (SILVADENE) 1 % cream Apply topically See admin instructions.    . triamcinolone cream (KENALOG) 0.1 % APPLY A THIN LAYER TO THE AFFECTED AREAS DAILY    . ULORIC 40 MG tablet Take 1 tablet by mouth every morning.     . verapamil (VERELAN PM) 360 MG 24 hr capsule Take 360 mg by mouth every morning.     . vitamin C (ASCORBIC ACID) 500 MG tablet Take 500 mg by mouth every morning.      No current facility-administered medications for this visit.   Facility-Administered Medications Ordered in Other Visits  Medication Dose Route Frequency Provider Last Rate Last Admin  . 0.9 %  sodium chloride infusion   Intravenous Continuous Derek Jack, MD 20 mL/hr at 05/07/18 1447 New Bag at 05/07/18 1447   Allergies:  Dihydrocodeine and Other   Social History: The patient  reports that she has never smoked. She has never used smokeless tobacco. She reports that she does not drink alcohol and does not use drugs.   Family History: The patient's family history includes Diabetes in her mother; Hypertension in an other family member.   ROS:  Please see the history of present illness. Otherwise, complete review of systems is positive for none.  All other systems are reviewed and negative.   Physical Exam: VS:  BP 128/64   Pulse 61   Ht _0  (1.6 m)   Wt 178 lb (80.7 kg)   BMI 31.53 kg/m , BMI Body mass index is 31.53 kg/m.  Wt Readings from Last 3 Encounters:  01/03/20 178 lb (80.7 kg)  10/12/19 181 lb (82.1 kg)  09/06/19 183 lb 11.2 oz (83.3 kg)    General: Patient appears comfortable at rest. Neck: Supple, no elevated JVP or carotid bruits, no thyromegaly. Lungs: Clear to auscultation, nonlabored breathing at rest. Cardiac: Regular  rate and rhythm, no S3 or significant systolic murmur, no pericardial rub. Extremities: No pitting edema, distal pulses 2+. Skin: Warm and dry. Musculoskeletal: No kyphosis. Neuropsychiatric: Alert and oriented x3, affect grossly appropriate.  ECG:  An ECG dated 01/03/2020 was personally reviewed today and demonstrated:  AV sequential dual-chamber pacemaker rate of 61  Recent Labwork: 08/30/2019: ALT 12; AST 16 10/18/2019: BUN 33; Creatinine, Ser 1.34; Hemoglobin 12.5; Platelets 260; Potassium 4.0; Sodium 141  No results found for: CHOL, TRIG, HDL, CHOLHDL, VLDL, LDLCALC, LDLDIRECT  Other Studies Reviewed Today:   Assessment and Plan:  1. Paroxysmal atrial  fibrillation ([place])   2. Cardiac pacemaker   3. Essential hypertension   4. Bilateral lower extremity edema    1. Paroxysmal atrial fibrillation (HCC) Denies any recent palpitations or arrhythmias.  Heart rate today 61 AV controlled dual-chamber pacemaker rhythm.  Continue Eliquis 2.5 mg p.o. twice daily.  2. Cardiac pacemaker MDT pacemaker placed in 2015 by Dr. Dot Lanes.  Last available device check, pacemaker was functioning properly.  History of complete heart block and sick sinus syndrome.  Status post pacemaker placement.  3. Essential hypertension Blood pressure well controlled on current therapy.  Continue benazepril 40 mg daily.  Carvedilol 6.25 mg p.o. twice daily.  4.  Lower extremity edema. Continues with chronic mild lower extremity edema.  Continue Lasix 40 mg p.o. daily.  Patient states she believes the edema is worse.  She has compression stockings but states they are very uncomfortable to wear and cause ulcerations on her legs when she attempts to wear them.   Medication Adjustments/Labs and Tests Ordered: Current medicines are reviewed at length with the patient today.  Concerns regarding medicines are outlined above.   Disposition: Follow-up with Dr. Domenic Polite or APP 1 year  Signed, Alicia July, NP 01/03/2020 4:31  PM    [facility] at [place], [place], [place] [address] Phone: [phone]; Fax: [phone]

## 2020-01-05 DIAGNOSIS — I10 Essential (primary) hypertension: Secondary | ICD-10-CM | POA: Diagnosis not present

## 2020-01-05 DIAGNOSIS — I872 Venous insufficiency (chronic) (peripheral): Secondary | ICD-10-CM | POA: Diagnosis not present

## 2020-01-06 DIAGNOSIS — I10 Essential (primary) hypertension: Secondary | ICD-10-CM | POA: Diagnosis not present

## 2020-01-06 DIAGNOSIS — I872 Venous insufficiency (chronic) (peripheral): Secondary | ICD-10-CM | POA: Diagnosis not present

## 2020-02-14 DIAGNOSIS — J219 Acute bronchiolitis, unspecified: Secondary | ICD-10-CM | POA: Diagnosis not present

## 2020-02-14 DIAGNOSIS — Z0000 Encounter for general adult medical examination without abnormal findings: Secondary | ICD-10-CM | POA: Diagnosis not present

## 2020-02-29 ENCOUNTER — Other Ambulatory Visit (HOSPITAL_COMMUNITY): Payer: Self-pay

## 2020-02-29 DIAGNOSIS — D508 Other iron deficiency anemias: Secondary | ICD-10-CM

## 2020-02-29 DIAGNOSIS — C50911 Malignant neoplasm of unspecified site of right female breast: Secondary | ICD-10-CM

## 2020-03-01 ENCOUNTER — Inpatient Hospital Stay (HOSPITAL_COMMUNITY): Payer: Medicare Other | Attending: Hematology

## 2020-03-01 ENCOUNTER — Other Ambulatory Visit: Payer: Self-pay

## 2020-03-01 DIAGNOSIS — D509 Iron deficiency anemia, unspecified: Secondary | ICD-10-CM | POA: Diagnosis not present

## 2020-03-01 DIAGNOSIS — C50911 Malignant neoplasm of unspecified site of right female breast: Secondary | ICD-10-CM

## 2020-03-01 DIAGNOSIS — E559 Vitamin D deficiency, unspecified: Secondary | ICD-10-CM | POA: Insufficient documentation

## 2020-03-01 DIAGNOSIS — D508 Other iron deficiency anemias: Secondary | ICD-10-CM

## 2020-03-01 LAB — COMPREHENSIVE METABOLIC PANEL
ALT: 10 U/L (ref 0–44)
AST: 14 U/L — ABNORMAL LOW (ref 15–41)
Albumin: 3.8 g/dL (ref 3.5–5.0)
Alkaline Phosphatase: 109 U/L (ref 38–126)
Anion gap: 12 (ref 5–15)
BUN: 23 mg/dL (ref 8–23)
CO2: 25 mmol/L (ref 22–32)
Calcium: 9.4 mg/dL (ref 8.9–10.3)
Chloride: 100 mmol/L (ref 98–111)
Creatinine, Ser: 1.27 mg/dL — ABNORMAL HIGH (ref 0.44–1.00)
GFR, Estimated: 43 mL/min — ABNORMAL LOW (ref >60)
Glucose, Bld: 142 mg/dL — ABNORMAL HIGH (ref 70–99)
Potassium: 3.8 mmol/L (ref 3.5–5.1)
Sodium: 137 mmol/L (ref 135–145)
Total Bilirubin: 0.6 mg/dL (ref 0.3–1.2)
Total Protein: 7 g/dL (ref 6.5–8.1)

## 2020-03-01 LAB — FERRITIN: Ferritin: 384 ng/mL — ABNORMAL HIGH (ref 11–307)

## 2020-03-01 LAB — CBC WITH DIFFERENTIAL/PLATELET
Abs Immature Granulocytes: 0.05 K/uL (ref 0.00–0.07)
Basophils Absolute: 0.1 K/uL (ref 0.0–0.1)
Basophils Relative: 1 %
Eosinophils Absolute: 0.4 K/uL (ref 0.0–0.5)
Eosinophils Relative: 4 %
HCT: 38.2 % (ref 36.0–46.0)
Hemoglobin: 12.2 g/dL (ref 12.0–15.0)
Immature Granulocytes: 1 %
Lymphocytes Relative: 14 %
Lymphs Abs: 1.5 K/uL (ref 0.7–4.0)
MCH: 30.9 pg (ref 26.0–34.0)
MCHC: 31.9 g/dL (ref 30.0–36.0)
MCV: 96.7 fL (ref 80.0–100.0)
Monocytes Absolute: 1 K/uL (ref 0.1–1.0)
Monocytes Relative: 10 %
Neutro Abs: 7.3 K/uL (ref 1.7–7.7)
Neutrophils Relative %: 70 %
Platelets: 232 K/uL (ref 150–400)
RBC: 3.95 MIL/uL (ref 3.87–5.11)
RDW: 13.2 % (ref 11.5–15.5)
WBC: 10.3 K/uL (ref 4.0–10.5)
nRBC: 0 % (ref 0.0–0.2)

## 2020-03-01 LAB — IRON AND TIBC
Iron: 30 ug/dL (ref 28–170)
Saturation Ratios: 9 % — ABNORMAL LOW (ref 10.4–31.8)
TIBC: 324 ug/dL (ref 250–450)
UIBC: 294 ug/dL

## 2020-03-01 LAB — VITAMIN D 25 HYDROXY (VIT D DEFICIENCY, FRACTURES): Vit D, 25-Hydroxy: 16.83 ng/mL — ABNORMAL LOW (ref 30–100)

## 2020-03-01 LAB — LACTATE DEHYDROGENASE: LDH: 110 U/L (ref 98–192)

## 2020-03-01 LAB — VITAMIN B12: Vitamin B-12: 263 pg/mL (ref 180–914)

## 2020-03-01 LAB — FOLATE: Folate: 24.1 ng/mL (ref >5.9)

## 2020-03-08 ENCOUNTER — Inpatient Hospital Stay (HOSPITAL_COMMUNITY): Payer: Medicare Other | Attending: Hematology | Admitting: Hematology

## 2020-03-08 VITALS — BP 154/65 | HR 60 | Temp 97.2°F | Resp 18 | Wt 178.0 lb

## 2020-03-08 DIAGNOSIS — Z7901 Long term (current) use of anticoagulants: Secondary | ICD-10-CM | POA: Diagnosis not present

## 2020-03-08 DIAGNOSIS — M8580 Other specified disorders of bone density and structure, unspecified site: Secondary | ICD-10-CM | POA: Diagnosis not present

## 2020-03-08 DIAGNOSIS — D508 Other iron deficiency anemias: Secondary | ICD-10-CM | POA: Diagnosis not present

## 2020-03-08 DIAGNOSIS — Z79811 Long term (current) use of aromatase inhibitors: Secondary | ICD-10-CM | POA: Insufficient documentation

## 2020-03-08 DIAGNOSIS — Z79899 Other long term (current) drug therapy: Secondary | ICD-10-CM | POA: Insufficient documentation

## 2020-03-08 DIAGNOSIS — E669 Obesity, unspecified: Secondary | ICD-10-CM | POA: Diagnosis not present

## 2020-03-08 DIAGNOSIS — N189 Chronic kidney disease, unspecified: Secondary | ICD-10-CM | POA: Insufficient documentation

## 2020-03-08 DIAGNOSIS — Z170 Estrogen receptor positive status [ER+]: Secondary | ICD-10-CM | POA: Insufficient documentation

## 2020-03-08 DIAGNOSIS — C50911 Malignant neoplasm of unspecified site of right female breast: Secondary | ICD-10-CM | POA: Insufficient documentation

## 2020-03-08 DIAGNOSIS — D649 Anemia, unspecified: Secondary | ICD-10-CM | POA: Diagnosis not present

## 2020-03-08 DIAGNOSIS — Z23 Encounter for immunization: Secondary | ICD-10-CM | POA: Insufficient documentation

## 2020-03-08 DIAGNOSIS — R5383 Other fatigue: Secondary | ICD-10-CM | POA: Diagnosis not present

## 2020-03-08 MED ORDER — ERGOCALCIFEROL 1.25 MG (50000 UT) PO CAPS: 50000.0000 [IU] | ORAL_CAPSULE | ORAL | 2 refills | Status: DC

## 2020-03-08 MED ORDER — INFLUENZA VAC A&B SA ADJ QUAD 0.5 ML IM PRSY
0.5000 mL | PREFILLED_SYRINGE | Freq: Once | INTRAMUSCULAR | Status: AC
  Administered 2020-03-08: 0.5 mL via INTRAMUSCULAR
  Filled 2020-03-08: qty 0.5

## 2020-03-08 NOTE — Progress Notes (Signed)
[facility] [address] [place] [address]   Patient Care Team: [name], MD as PCP - General (Unknown Physician Specialty) Thompson Grayer, MD as PCP - Electrophysiology (Cardiology) Garald Balding, MD (Orthopedic Surgery) Cristal Deer, DPM (Podiatry)  SUMMARY OF ONCOLOGIC HISTORY: Oncology History  Lobular carcinoma of right breast ([place])  08/30/2014 Mammogram   Ill-defined shadowing mass at 10:00 position 6 cm from the nipple measuring [phone] cm in size without lymphadenopathy seen in the right axilla.   09/06/2014 Procedure   Biopsy of right breast mass at 10 o'clock position.   09/08/2014 Pathology Results   Invasive carcinoma, favor invasive ductal carcinoma.  Some features are suggestive of lobular carcinoma.  Negative e-cadherin immunostaining throughout the lesional cells supports the diagnosis of invasive lobular carcinoma.   09/08/2014 Pathology Results   ER 90%, PR 90%. HER2 FISH negative, Ki-67 26%.   09/20/2014 Definitive Surgery   Lumpectomy   09/20/2014 Pathology Results   Invasive lobular carcinoma, 0.6 cm in maximal dimension, closest margin is 0.3 cm at posterior resection margin. Grade 2. No LV I. 0/1 lymph nodes.   09/27/2014 -  Anti-estrogen oral therapy   Arimidex   08/08/2015 Mammogram   BIRADS 2   09/20/2015 Procedure   Right breast lumpectomy by Dr. Anthony Sar with sentinel lymph node biopsy.     CHIEF COMPLIANT: Follow-up for right breast cancer & anemia   INTERVAL HISTORY: [name] is a 79 y.o. female here today for follow up of her right breast cancer and anemia. Her last visit was on 03/21/2019.   Today she reports feeling well. Her only complaint is arthritic knees. She is tolerating Arimidex well and denies having any issues.    REVIEW OF SYSTEMS:   Review of Systems  Constitutional: Positive for appetite change (75%) and fatigue (75%).  Musculoskeletal: Positive for arthralgias (arthritis in  knees).  All other systems reviewed and are negative.   I have reviewed the past medical history, past surgical history, social history and family history with the patient and they are unchanged from previous note.   ALLERGIES:   is allergic to dihydrocodeine and other.   MEDICATIONS:  Current Outpatient Medications  Medication Sig Dispense Refill   anastrozole (ARIMIDEX) 1 MG tablet TAKE ONE TABLET BY MOUTH DAILY 30 tablet 5   apixaban (ELIQUIS) 2.5 MG TABS tablet Take 1 tablet (2.5 mg total) by mouth 2 (two) times daily. 60 tablet 1   benazepril (LOTENSIN) 40 MG tablet Take 1 tablet by mouth every morning.      carvedilol (COREG) 6.25 MG tablet      cetirizine (ZYRTEC) 10 MG tablet Take 10 mg by mouth daily as needed for allergies.   0   citalopram (CELEXA) 20 MG tablet Take 20 mg by mouth every morning.      Cranberry 400 MG CAPS Take 1 tablet by mouth every morning.      diazepam (VALIUM) 5 MG tablet Take 1 tablet 2 (two) times daily by mouth.     docusate sodium (COLACE) 100 MG capsule Take 100 mg by mouth every morning.     furosemide (LASIX) 40 MG tablet Take 1 tablet by mouth every morning.      gemfibrozil (LOPID) 600 MG tablet Take 1 tablet by mouth every morning.      Multiple Vitamin (MULTIVITAMIN) tablet Take 1 tablet by mouth every morning.      pantoprazole (PROTONIX) 40 MG tablet Take 1  tablet (40 mg total) by mouth daily. 30 tablet 4   PNEUMOVAX 23 25 MCG/0.5ML injection      silver sulfADIAZINE (SILVADENE) 1 % cream Apply topically See admin instructions.     triamcinolone cream (KENALOG) 0.1 % APPLY A THIN LAYER TO THE AFFECTED AREAS DAILY     ULORIC 40 MG tablet Take 1 tablet by mouth every morning.      verapamil (VERELAN PM) 360 MG 24 hr capsule Take 360 mg by mouth every morning.      vitamin C (ASCORBIC ACID) 500 MG tablet Take 500 mg by mouth every morning.      acetaminophen (TYLENOL) 500 MG tablet Take 500 mg by mouth daily as needed for  mild pain or moderate pain. (Patient not taking: Reported on 03/08/2020)     albuterol (VENTOLIN HFA) 108 (90 Base) MCG/ACT inhaler  (Patient not taking: Reported on 03/08/2020)     ergocalciferol (VITAMIN D2) 1.25 MG (50000 UT) capsule Take 1 capsule (50,000 Units total) by mouth once a week. 12 capsule 2   No current facility-administered medications for this visit.   Facility-Administered Medications Ordered in Other Visits  Medication Dose Route Frequency Provider Last Rate Last Admin   0.9 %  sodium chloride infusion   Intravenous Continuous Derek Jack, MD 20 mL/hr at 05/07/18 1447 New Bag at 05/07/18 1447     PHYSICAL EXAMINATION: Performance status (ECOG): 1 - Symptomatic but completely ambulatory  Vitals:   03/08/20 1359  BP: (!) 154/65  Pulse: 60  Resp: 18  Temp: (!) 97.2 F (36.2 C)  SpO2: 96%   Wt Readings from Last 3 Encounters:  03/08/20 178 lb (80.7 kg)  01/03/20 178 lb (80.7 kg)  10/12/19 181 lb (82.1 kg)   Physical Exam Vitals reviewed.  Constitutional:      Appearance: Normal appearance. She is obese.  Cardiovascular:     Rate and Rhythm: Normal rate and regular rhythm.     Pulses: Normal pulses.     Heart sounds: Normal heart sounds.  Pulmonary:     Effort: Pulmonary effort is normal.     Breath sounds: Normal breath sounds.  Chest:     Breasts:        Right: Skin change (upper outer lumpectomy scar well healed) present. No inverted nipple, mass, nipple discharge or tenderness.        Left: Normal. No inverted nipple, mass, nipple discharge, skin change or tenderness.  Abdominal:     Palpations: Abdomen is soft. There is no hepatomegaly, splenomegaly or mass.     Tenderness: There is no abdominal tenderness.     Hernia: No hernia is present.  Lymphadenopathy:     Upper Body:     Right upper body: No supraclavicular, axillary or pectoral adenopathy.     Left upper body: No supraclavicular, axillary or pectoral adenopathy.     Lower Body:  No right inguinal adenopathy. No left inguinal adenopathy.  Neurological:     General: No focal deficit present.     Mental Status: She is alert and oriented to person, place, and time.  Psychiatric:        Mood and Affect: Mood normal.        Behavior: Behavior normal.     Breast Exam Chaperone: Milinda Antis, MD     LABORATORY DATA:  I have reviewed the data as listed CMP Latest Ref Rng & Units 03/01/2020 10/18/2019 08/30/2019  Glucose 70 - 99 mg/dL 142(H) 98 111(H)  BUN  8 - 23 mg/dL 23 33(H) 35(H)  Creatinine 0.44 - 1.00 mg/dL 1.27(H) 1.34(H) 1.39(H)  Sodium 135 - 145 mmol/L 137 141 140  Potassium 3.5 - 5.1 mmol/L 3.8 4.0 3.7  Chloride 98 - 111 mmol/L 100 103 104  CO2 22 - 32 mmol/L _0 Calcium 8.9 - 10.3 mg/dL 9.4 9.6 9.3  Total Protein 6.5 - 8.1 g/dL 7.0 - 7.0  Total Bilirubin 0.3 - 1.2 mg/dL 0.6 - 0.3  Alkaline Phos 38 - 126 U/L 109 - 103  AST 15 - 41 U/L 14(L) - 16  ALT 0 - 44 U/L 10 - 12   No results found for: [id number] Lab Results  Component Value Date   WBC 10.3 03/01/2020   HGB 12.2 03/01/2020   HCT 38.2 03/01/2020   MCV 96.7 03/01/2020   PLT 232 03/01/2020   NEUTROABS 7.3 03/01/2020   Lab Results  Component Value Date   LDH 110 03/01/2020   Lab Results  Component Value Date   VD25OH 16.83 (L) 03/01/2020   VD25OH 25.8 (L) 11/19/2016   VD25OH 26.2 (L) 08/20/2016   Lab Results  Component Value Date   TIBC 324 03/01/2020   TIBC 344 08/30/2019   TIBC 348 03/14/2019   FERRITIN 384 (H) 03/01/2020   FERRITIN 140 08/30/2019   FERRITIN 166 03/14/2019   IRONPCTSAT 9 (L) 03/01/2020   IRONPCTSAT 10 (L) 08/30/2019   IRONPCTSAT 11 03/14/2019    ASSESSMENT:  1.  Stage I (PT 1 BP N0) right breast lobular carcinoma: -Status post lumpectomy on 09/20/2014, 0.6 cm, ER/PR positive, HER-2 negative by FISH, Ki-67 of 26%, Arimidex started on 09/27/2014.  For some reason she did not receive radiation therapy. -She is tolerating anastrozole very well without any  major problems. -Mammogram on 08/24/2018 was BI-RADS Category 2. -Mammogram on 08/30/2019 was BI-RADS Category 2.  2.  Normocytic anemia: -This is from combination of CKD and iron deficiency state. -She reportedly received 4 units of PRBC in March at [facility].  She had colonoscopy on 07/14/2017 at [facility] which was normal. - A capsule study on 08/26/2017 by Dr. Laural Golden showed few small bowel punctate AVM with speck of blood, no active bleeding. -Last Feraheme on 09/14/2019 and 09/21/2019.   3.  Osteopenia: - DEXA scan on 08/13/2015 shows T score of -1.7 in the left femoral neck.   - DEXA scan on 04/15/2018 shows T score of -1.7 and stable.    PLAN:  1.  Stage I (PT 1 BP N0) right breast lobular carcinoma: -Physical exam today shows stable right upper outer quadrant scar with no palpable masses. -We reviewed mammogram results.  We have also reviewed her LFTs which are within normal limits. -RTC 4 months with follow-up.  2.  Normocytic anemia: -Hemoglobin is 12.2.  Ferritin is 384 with percent saturation of 9. -No parenteral iron therapy needed. -RTC 4 months with labs.  3.  Osteopenia: -Vitamin D level is 16.8. -I have sent a prescription for vitamin D 50,000 units weekly.  We will recheck in 4 months.    Breast Cancer therapy associated bone loss: I have recommended calcium, Vitamin D and weight bearing exercises.   No orders of the defined types were placed in this encounter.  The patient has a good understanding of the overall plan. she agrees with it. she will call with any problems that may develop before the next visit here.    Derek Jack, MD [place] [phone]  Saunders Revel, am acting as a scribe for Dr. Sanda Linger.  I, Derek Jack MD, have reviewed the above documentation for accuracy and completeness, and I agree with the above.

## 2020-03-08 NOTE — Patient Instructions (Signed)
[facility] at [facility] Discharge Instructions  You were seen today by Dr. Delton Coombes. He went over your recent results and scans. You will be prescribed vitamin D 50,000 units and take 1 capsule weekly. Dr. Delton Coombes will see you back in 4 months for labs and follow up.   Thank you for choosing [facility] at [facility] to provide your oncology and hematology care.  To afford each patient quality time with our provider, please arrive at least 15 minutes before your scheduled appointment time.   If you have a lab appointment with the [facility] please come in thru the Main Entrance and check in at the main information desk  You need to re-schedule your appointment should you arrive 10 or more minutes late.  We strive to give you quality time with our providers, and arriving late affects you and other patients whose appointments are after yours.  Also, if you no show three or more times for appointments you may be dismissed from the clinic at the providers discretion.     Again, thank you for choosing [facility].  Our hope is that these requests will decrease the amount of time that you wait before being seen by our physicians.       _____________________________________________________________  Should you have questions after your visit to [facility], please contact our office at [phone] between the hours of 8:00 a.m. and 4:30 p.m.  Voicemails left after 4:00 p.m. will not be returned until the following business day.  For prescription refill requests, have your pharmacy contact our office and allow 72 hours.    Cancer Center Support Programs:   > Cancer Support Group  2nd Tuesday of the month 1pm-2pm, Journey Room

## 2020-03-09 ENCOUNTER — Encounter: Payer: Self-pay | Admitting: Internal Medicine

## 2020-03-09 ENCOUNTER — Ambulatory Visit (INDEPENDENT_AMBULATORY_CARE_PROVIDER_SITE_OTHER): Payer: Medicare Other | Admitting: Internal Medicine

## 2020-03-09 VITALS — BP 164/78 | HR 64 | Ht 63.0 in | Wt 179.0 lb

## 2020-03-09 DIAGNOSIS — I442 Atrioventricular block, complete: Secondary | ICD-10-CM

## 2020-03-09 DIAGNOSIS — I480 Paroxysmal atrial fibrillation: Secondary | ICD-10-CM | POA: Diagnosis not present

## 2020-03-09 DIAGNOSIS — I10 Essential (primary) hypertension: Secondary | ICD-10-CM

## 2020-03-09 LAB — CUP PACEART INCLINIC DEVICE CHECK
Battery Impedance: 2055 Ohm
Battery Remaining Longevity: 27 mo
Battery Voltage: 2.75 V
Brady Statistic AP VP Percent: 34 %
Brady Statistic AP VS Percent: 0 %
Brady Statistic AS VP Percent: 66 %
Brady Statistic AS VS Percent: 0 %
Date Time Interrogation Session: 20211105131726
Implantable Lead Implant Date: 20150108
Implantable Lead Implant Date: 20150108
Implantable Lead Location: 753859
Implantable Lead Location: 753860
Implantable Lead Model: 5076
Implantable Lead Model: 5076
Implantable Pulse Generator Implant Date: 20150108
Lead Channel Impedance Value: 408 Ohm
Lead Channel Impedance Value: 411 Ohm
Lead Channel Pacing Threshold Amplitude: 0.5 V
Lead Channel Pacing Threshold Amplitude: 0.75 V
Lead Channel Pacing Threshold Pulse Width: 0.4 ms
Lead Channel Pacing Threshold Pulse Width: 0.4 ms
Lead Channel Sensing Intrinsic Amplitude: 0.35 mV
Lead Channel Setting Pacing Amplitude: 2 V
Lead Channel Setting Pacing Amplitude: 2.5 V
Lead Channel Setting Pacing Pulse Width: 0.4 ms
Lead Channel Setting Sensing Sensitivity: 2 mV

## 2020-03-09 NOTE — Progress Notes (Signed)
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 female who presents today for routine electrophysiology followup.  Since last being seen in our clinic, the patient reports doing very well.  Today, she denies symptoms of palpitations, chest pain, shortness of breath,   dizziness, presyncope, or syncope.  She has stable edema.   The patient is otherwise without complaint today.   Past Medical History:  Diagnosis Date  . Arthritis   . Breast cancer ([place])   . Cellulitis    Right leg  . CHF (congestive heart failure) ([place])   . Hyperlipidemia   . Hypertension   . Lobular carcinoma of right breast ([place]) 09/27/2014  . Malignant neoplasm of upper-outer quadrant of right female breast ([place]) 09/27/2014  . Paroxysmal atrial fibrillation ([facility])   . Varicose veins    venous stasis skin changes   Past Surgical History:  Procedure Laterality Date  . ABDOMINAL HYSTERECTOMY    . CHOLECYSTECTOMY    . ESOPHAGOGASTRODUODENOSCOPY N/A 01/13/2018   Procedure: ESOPHAGOGASTRODUODENOSCOPY (EGD);  Surgeon: Rogene Houston, MD;  Location: [facility];  Service: Endoscopy;  Laterality: N/A;  . FOOT SURGERY     Multiple foot surgeries by Dr. Blanch Media  . GIVENS CAPSULE STUDY N/A 08/26/2017   Procedure: GIVENS CAPSULE STUDY;  Surgeon: Rogene Houston, MD;  Location: [facility];  Service: Endoscopy;  Laterality: N/A;  8:30  . JOINT REPLACEMENT     bilateral knee by Drs. McGee and Roderfield  . PACEMAKER IMPLANT Left 05/12/2013   MDT Sherril Croon PPM implanted by Dr Dot Lanes for CHB and SSS  . SHOULDER SURGERY      ROS- all systems are reviewed and negative except as per HPI above  Current Outpatient Medications  Medication Sig Dispense Refill  . acetaminophen (TYLENOL) 500 MG tablet Take 500 mg by mouth daily as needed for mild pain or moderate pain.     Marland Kitchen albuterol (VENTOLIN HFA) 108 (90 Base) MCG/ACT inhaler     . anastrozole (ARIMIDEX) 1 MG  tablet TAKE ONE TABLET BY MOUTH DAILY 30 tablet 5  . apixaban (ELIQUIS) 2.5 MG TABS tablet Take 1 tablet (2.5 mg total) by mouth 2 (two) times daily. 60 tablet 1  . benazepril (LOTENSIN) 40 MG tablet Take 1 tablet by mouth every morning.     . carvedilol (COREG) 6.25 MG tablet     . cetirizine (ZYRTEC) 10 MG tablet Take 10 mg by mouth daily as needed for allergies.   0  . citalopram (CELEXA) 20 MG tablet Take 20 mg by mouth every morning.     . Cranberry 400 MG CAPS Take 1 tablet by mouth every morning.     . diazepam (VALIUM) 5 MG tablet Take 1 tablet 2 (two) times daily by mouth.    . docusate sodium (COLACE) 100 MG capsule Take 100 mg by mouth every morning.    . ergocalciferol (VITAMIN D2) 1.25 MG (50000 UT) capsule Take 1 capsule (50,000 Units total) by mouth once a week. 12 capsule 2  . furosemide (LASIX) 40 MG tablet Take 1 tablet by mouth every morning.     Marland Kitchen gemfibrozil (LOPID) 600 MG tablet Take 1 tablet by mouth every morning.     . Multiple Vitamin (MULTIVITAMIN) tablet Take 1 tablet by mouth every morning.     . pantoprazole (PROTONIX) 40 MG tablet Take 1 tablet (40 mg total) by mouth daily. [place]  tablet 4  . PNEUMOVAX 23 25 MCG/0.5ML injection     . silver sulfADIAZINE (SILVADENE) 1 % cream Apply topically See admin instructions.    . triamcinolone cream (KENALOG) 0.1 % APPLY A THIN LAYER TO THE AFFECTED AREAS DAILY    . ULORIC 40 MG tablet Take 1 tablet by mouth every morning.     . verapamil (VERELAN PM) 360 MG 24 hr capsule Take 360 mg by mouth every morning.     . vitamin C (ASCORBIC ACID) 500 MG tablet Take 500 mg by mouth every morning.      No current facility-administered medications for this visit.   Facility-Administered Medications Ordered in Other Visits  Medication Dose Route Frequency Provider Last Rate Last Admin  . 0.9 %  sodium chloride infusion   Intravenous Continuous Derek Jack, MD 20 mL/hr at 05/07/18 1447 New Bag at 05/07/18 1447    Physical  Exam: Vitals:   03/09/20 1231  BP: (!) 164/78  Pulse: 64  SpO2: 94%  Weight: 179 lb (81.2 kg)  Height: _0  (1.6 m)    GEN- The patient is well appearing, alert and oriented x 3 today.   Head- normocephalic, atraumatic Eyes-  Sclera clear, conjunctiva pink Ears- hearing intact Oropharynx- clear Lungs- Clear to ausculation bilaterally, normal work of breathing Chest- pacemaker pocket is well healed Heart- Regular rate and rhythm, no murmurs, rubs or gallops, PMI not laterally displaced GI- soft, NT, ND, + BS Extremities- no clubbing, cyanosis, or edema  Pacemaker interrogation- reviewed in detail today,  See PACEART report   Assessment and Plan:  1. Symptomatic sinus bradycardia and complete heart block Normal pacemaker function See Pace Art report No changes today she is device dependant today  2. Paroxysmal atrial fibrillation afib burden is <1 % (previously 0.8%) Continue eliquis  3. HTN Stable No change required today   Risks, benefits and potential toxicities for medications prescribed and/or refilled reviewed with patient today.   Return in a year  Thompson Grayer MD, [facility] 03/09/2020 12:41 PM

## 2020-03-09 NOTE — Patient Instructions (Addendum)

## 2020-03-15 DIAGNOSIS — N3091 Cystitis, unspecified with hematuria: Secondary | ICD-10-CM | POA: Diagnosis not present

## 2020-04-02 DIAGNOSIS — I872 Venous insufficiency (chronic) (peripheral): Secondary | ICD-10-CM | POA: Diagnosis not present

## 2020-06-08 ENCOUNTER — Ambulatory Visit (INDEPENDENT_AMBULATORY_CARE_PROVIDER_SITE_OTHER): Payer: Medicare Other

## 2020-06-08 DIAGNOSIS — I442 Atrioventricular block, complete: Secondary | ICD-10-CM | POA: Diagnosis not present

## 2020-06-08 DIAGNOSIS — I480 Paroxysmal atrial fibrillation: Secondary | ICD-10-CM

## 2020-06-11 LAB — CUP PACEART REMOTE DEVICE CHECK
Battery Impedance: 2254 Ohm
Battery Remaining Longevity: 24 mo
Battery Voltage: 2.74 V
Brady Statistic AP VP Percent: 48 %
Brady Statistic AP VS Percent: 0 %
Brady Statistic AS VP Percent: 52 %
Brady Statistic AS VS Percent: 0 %
Date Time Interrogation Session: 20220206164209
Implantable Lead Implant Date: 20150108
Implantable Lead Implant Date: 20150108
Implantable Lead Location: 753859
Implantable Lead Location: 753860
Implantable Lead Model: 5076
Implantable Lead Model: 5076
Implantable Pulse Generator Implant Date: 20150108
Lead Channel Impedance Value: 421 Ohm
Lead Channel Impedance Value: 435 Ohm
Lead Channel Pacing Threshold Amplitude: 0.625 V
Lead Channel Pacing Threshold Amplitude: 0.625 V
Lead Channel Pacing Threshold Pulse Width: 0.4 ms
Lead Channel Pacing Threshold Pulse Width: 0.4 ms
Lead Channel Setting Pacing Amplitude: 2 V
Lead Channel Setting Pacing Amplitude: 2.5 V
Lead Channel Setting Pacing Pulse Width: 0.4 ms
Lead Channel Setting Sensing Sensitivity: 2 mV

## 2020-06-12 NOTE — Progress Notes (Signed)
Remote pacemaker transmission.   

## 2020-07-02 DIAGNOSIS — J449 Chronic obstructive pulmonary disease, unspecified: Secondary | ICD-10-CM | POA: Diagnosis not present

## 2020-07-02 DIAGNOSIS — I10 Essential (primary) hypertension: Secondary | ICD-10-CM | POA: Diagnosis not present

## 2020-07-02 DIAGNOSIS — J3089 Other allergic rhinitis: Secondary | ICD-10-CM | POA: Diagnosis not present

## 2020-07-02 DIAGNOSIS — I872 Venous insufficiency (chronic) (peripheral): Secondary | ICD-10-CM | POA: Diagnosis not present

## 2020-07-02 DIAGNOSIS — Z0000 Encounter for general adult medical examination without abnormal findings: Secondary | ICD-10-CM | POA: Diagnosis not present

## 2020-07-04 ENCOUNTER — Inpatient Hospital Stay (HOSPITAL_COMMUNITY): Payer: Medicare Other | Attending: Hematology

## 2020-07-06 ENCOUNTER — Other Ambulatory Visit (HOSPITAL_COMMUNITY): Payer: Self-pay

## 2020-07-06 DIAGNOSIS — C50919 Malignant neoplasm of unspecified site of unspecified female breast: Secondary | ICD-10-CM

## 2020-07-06 MED ORDER — ANASTROZOLE 1 MG PO TABS: 1.0000 mg | ORAL_TABLET | Freq: Every day | ORAL | 5 refills | Status: DC

## 2020-07-11 ENCOUNTER — Ambulatory Visit (HOSPITAL_COMMUNITY): Payer: Medicare Other | Admitting: Hematology

## 2020-07-16 ENCOUNTER — Other Ambulatory Visit: Payer: Self-pay

## 2020-07-16 ENCOUNTER — Inpatient Hospital Stay (HOSPITAL_COMMUNITY): Payer: Medicare Other | Attending: Hematology

## 2020-07-16 DIAGNOSIS — M8580 Other specified disorders of bone density and structure, unspecified site: Secondary | ICD-10-CM | POA: Insufficient documentation

## 2020-07-16 DIAGNOSIS — C50411 Malignant neoplasm of upper-outer quadrant of right female breast: Secondary | ICD-10-CM | POA: Insufficient documentation

## 2020-07-16 DIAGNOSIS — D649 Anemia, unspecified: Secondary | ICD-10-CM | POA: Diagnosis not present

## 2020-07-16 DIAGNOSIS — D508 Other iron deficiency anemias: Secondary | ICD-10-CM

## 2020-07-16 DIAGNOSIS — Z79811 Long term (current) use of aromatase inhibitors: Secondary | ICD-10-CM | POA: Diagnosis not present

## 2020-07-16 DIAGNOSIS — Z79899 Other long term (current) drug therapy: Secondary | ICD-10-CM | POA: Diagnosis not present

## 2020-07-16 DIAGNOSIS — Z170 Estrogen receptor positive status [ER+]: Secondary | ICD-10-CM | POA: Insufficient documentation

## 2020-07-16 DIAGNOSIS — C50911 Malignant neoplasm of unspecified site of right female breast: Secondary | ICD-10-CM

## 2020-07-16 LAB — CBC WITH DIFFERENTIAL/PLATELET
Abs Immature Granulocytes: 0.03 10*3/uL (ref 0.00–0.07)
Basophils Absolute: 0.1 10*3/uL (ref 0.0–0.1)
Basophils Relative: 1 %
Eosinophils Absolute: 0.3 10*3/uL (ref 0.0–0.5)
Eosinophils Relative: 4 %
HCT: 38.8 % (ref 36.0–46.0)
Hemoglobin: 12.2 g/dL (ref 12.0–15.0)
Immature Granulocytes: 1 %
Lymphocytes Relative: 21 %
Lymphs Abs: 1.4 10*3/uL (ref 0.7–4.0)
MCH: 30.7 pg (ref 26.0–34.0)
MCHC: 31.4 g/dL (ref 30.0–36.0)
MCV: 97.7 fL (ref 80.0–100.0)
Monocytes Absolute: 0.5 10*3/uL (ref 0.1–1.0)
Monocytes Relative: 8 %
Neutro Abs: 4.3 10*3/uL (ref 1.7–7.7)
Neutrophils Relative %: 65 %
Platelets: 240 10*3/uL (ref 150–400)
RBC: 3.97 MIL/uL (ref 3.87–5.11)
RDW: 14.6 % (ref 11.5–15.5)
WBC: 6.6 10*3/uL (ref 4.0–10.5)
nRBC: 0 % (ref 0.0–0.2)

## 2020-07-16 LAB — FERRITIN: Ferritin: 304 ng/mL (ref 11–307)

## 2020-07-16 LAB — IRON AND TIBC
Iron: 59 ug/dL (ref 28–170)
Saturation Ratios: 18 % (ref 10.4–31.8)
TIBC: 325 ug/dL (ref 250–450)
UIBC: 266 ug/dL

## 2020-07-16 LAB — VITAMIN D 25 HYDROXY (VIT D DEFICIENCY, FRACTURES): Vit D, 25-Hydroxy: 50.47 ng/mL (ref 30–100)

## 2020-07-23 DIAGNOSIS — N3090 Cystitis, unspecified without hematuria: Secondary | ICD-10-CM | POA: Diagnosis not present

## 2020-07-24 ENCOUNTER — Ambulatory Visit (HOSPITAL_COMMUNITY): Payer: Medicare Other | Admitting: Oncology

## 2020-08-01 DIAGNOSIS — N390 Urinary tract infection, site not specified: Secondary | ICD-10-CM | POA: Diagnosis not present

## 2020-08-03 DIAGNOSIS — N390 Urinary tract infection, site not specified: Secondary | ICD-10-CM | POA: Diagnosis not present

## 2020-08-08 ENCOUNTER — Inpatient Hospital Stay (HOSPITAL_COMMUNITY): Payer: Medicare Other | Attending: Hematology | Admitting: Hematology

## 2020-08-08 ENCOUNTER — Other Ambulatory Visit: Payer: Self-pay

## 2020-08-08 ENCOUNTER — Other Ambulatory Visit (HOSPITAL_COMMUNITY): Payer: Self-pay | Admitting: Hematology

## 2020-08-08 VITALS — BP 145/65 | HR 63 | Temp 97.3°F | Resp 18 | Wt 178.1 lb

## 2020-08-08 DIAGNOSIS — D649 Anemia, unspecified: Secondary | ICD-10-CM | POA: Insufficient documentation

## 2020-08-08 DIAGNOSIS — C50911 Malignant neoplasm of unspecified site of right female breast: Secondary | ICD-10-CM | POA: Insufficient documentation

## 2020-08-08 DIAGNOSIS — Z79899 Other long term (current) drug therapy: Secondary | ICD-10-CM | POA: Insufficient documentation

## 2020-08-08 DIAGNOSIS — M8580 Other specified disorders of bone density and structure, unspecified site: Secondary | ICD-10-CM | POA: Diagnosis not present

## 2020-08-08 DIAGNOSIS — Z79811 Long term (current) use of aromatase inhibitors: Secondary | ICD-10-CM | POA: Diagnosis not present

## 2020-08-08 DIAGNOSIS — Z7901 Long term (current) use of anticoagulants: Secondary | ICD-10-CM | POA: Diagnosis not present

## 2020-08-08 DIAGNOSIS — Z1231 Encounter for screening mammogram for malignant neoplasm of breast: Secondary | ICD-10-CM

## 2020-08-08 DIAGNOSIS — D508 Other iron deficiency anemias: Secondary | ICD-10-CM | POA: Diagnosis not present

## 2020-08-08 DIAGNOSIS — R5383 Other fatigue: Secondary | ICD-10-CM | POA: Insufficient documentation

## 2020-08-08 DIAGNOSIS — Z170 Estrogen receptor positive status [ER+]: Secondary | ICD-10-CM | POA: Diagnosis not present

## 2020-08-08 NOTE — Patient Instructions (Signed)
[place] at [facility] Discharge Instructions  You were seen today by Dr. Delton Coombes. He went over your recent results. You will be scheduled to have a mammogram after August 29, 2020. Dr. Delton Coombes will see you back in 6 months for labs and follow up.   Thank you for choosing [place] at [facility] to provide your oncology and hematology care.  To afford each patient quality time with our provider, please arrive at least 15 minutes before your scheduled appointment time.   If you have a lab appointment with the [facility] please come in thru the Main Entrance and check in at the main information desk  You need to re-schedule your appointment should you arrive 10 or more minutes late.  We strive to give you quality time with our providers, and arriving late affects you and other patients whose appointments are after yours.  Also, if you no show three or more times for appointments you may be dismissed from the clinic at the providers discretion.     Again, thank you for choosing [facility].  Our hope is that these requests will decrease the amount of time that you wait before being seen by our physicians.       _____________________________________________________________  Should you have questions after your visit to [facility], please contact our office at [phone] between the hours of 8:00 a.m. and 4:30 p.m.  Voicemails left after 4:00 p.m. will not be returned until the following business day.  For prescription refill requests, have your pharmacy contact our office and allow 72 hours.    Cancer Center Support Programs:   > Cancer Support Group  2nd Tuesday of the month 1pm-2pm, Journey Room

## 2020-08-08 NOTE — Progress Notes (Signed)
[place] [address], [place] [address]   Patient Care Team: [name], MD as PCP - General (Unknown Physician Specialty) Thompson Grayer, MD as PCP - Electrophysiology (Cardiology) Garald Balding, MD (Orthopedic Surgery) Cristal Deer, DPM (Podiatry)  SUMMARY OF ONCOLOGIC HISTORY: Oncology History  Lobular carcinoma of right breast ([place])  08/30/2014 Mammogram   Ill-defined shadowing mass at 10:00 position 6 cm from the nipple measuring [phone] cm in size without lymphadenopathy seen in the right axilla.   09/06/2014 Procedure   Biopsy of right breast mass at 10 o'clock position.   09/08/2014 Pathology Results   Invasive carcinoma, favor invasive ductal carcinoma.  Some features are suggestive of lobular carcinoma.  Negative e-cadherin immunostaining throughout the lesional cells supports the diagnosis of invasive lobular carcinoma.   09/08/2014 Pathology Results   ER 90%, PR 90%. HER2 FISH negative, Ki-67 26%.   09/20/2014 Definitive Surgery   Lumpectomy   09/20/2014 Pathology Results   Invasive lobular carcinoma, 0.6 cm in maximal dimension, closest margin is 0.3 cm at posterior resection margin. Grade 2. No LV I. 0/1 lymph nodes.   09/27/2014 -  Anti-estrogen oral therapy   Arimidex   08/08/2015 Mammogram   BIRADS 2   09/20/2015 Procedure   Right breast lumpectomy by Dr. Anthony Sar with sentinel lymph node biopsy.     CHIEF COMPLIANT: Follow-up for right breast cancer & anemia   INTERVAL HISTORY: [name] is a 80 y.o. female here today for follow up of her right breast cancer and anemia. Her last visit was on 03/08/2020.   Today she reports feeling okay. She continues taking Arimidex daily and vitamin D weekly and tolerating them well. She denies having nosebleeds, hematochezia, melena or hematuria.   REVIEW OF SYSTEMS:   Review of Systems  Constitutional: Positive for fatigue (50%). Negative for appetite change.  HENT:    Negative for nosebleeds.   Gastrointestinal: Negative for blood in stool.  Genitourinary: Negative for hematuria.   All other systems reviewed and are negative.   I have reviewed the past medical history, past surgical history, social history and family history with the patient and they are unchanged from previous note.   ALLERGIES:   is allergic to dihydrocodeine and other.   MEDICATIONS:  Current Outpatient Medications  Medication Sig Dispense Refill  . acetaminophen (TYLENOL) 500 MG tablet Take 500 mg by mouth daily as needed for mild pain or moderate pain.     Marland Kitchen albuterol (VENTOLIN HFA) 108 (90 Base) MCG/ACT inhaler     . anastrozole (ARIMIDEX) 1 MG tablet Take 1 tablet (1 mg total) by mouth daily. 30 tablet 5  . apixaban (ELIQUIS) 2.5 MG TABS tablet Take 1 tablet (2.5 mg total) by mouth 2 (two) times daily. 60 tablet 1  . benazepril (LOTENSIN) 40 MG tablet Take 1 tablet by mouth every morning.     . carvedilol (COREG) 6.25 MG tablet     . cetirizine (ZYRTEC) 10 MG tablet Take 10 mg by mouth daily as needed for allergies.   0  . ciprofloxacin (CIPRO) 500 MG tablet Take 500 mg by mouth 2 (two) times daily.    . citalopram (CELEXA) 20 MG tablet Take 20 mg by mouth every morning.     . Colchicine 0.6 MG CAPS Take 1 capsule by mouth 2 (two) times daily.    . Cranberry 400 MG CAPS Take 1 tablet by mouth every morning.     Marland Kitchen  diazepam (VALIUM) 5 MG tablet Take 1 tablet 2 (two) times daily by mouth.    . docusate sodium (COLACE) 100 MG capsule Take 100 mg by mouth every morning.    . ergocalciferol (VITAMIN D2) 1.25 MG (50000 UT) capsule Take 1 capsule (50,000 Units total) by mouth once a week. 12 capsule 2  . furosemide (LASIX) 40 MG tablet Take 1 tablet by mouth every morning.     Marland Kitchen gemfibrozil (LOPID) 600 MG tablet Take 1 tablet by mouth every morning.     . hydrALAZINE (APRESOLINE) 25 MG tablet Take 25 mg by mouth 2 (two) times daily.    . Multiple Vitamin (MULTIVITAMIN) tablet Take  1 tablet by mouth every morning.     . pantoprazole (PROTONIX) 40 MG tablet Take 1 tablet (40 mg total) by mouth daily. 30 tablet 4  . PNEUMOVAX 23 25 MCG/0.5ML injection     . silver sulfADIAZINE (SILVADENE) 1 % cream Apply topically See admin instructions.    . triamcinolone cream (KENALOG) 0.1 % APPLY A THIN LAYER TO THE AFFECTED AREAS DAILY    . ULORIC 40 MG tablet Take 1 tablet by mouth every morning.     . verapamil (VERELAN PM) 360 MG 24 hr capsule Take 360 mg by mouth every morning.     . vitamin C (ASCORBIC ACID) 500 MG tablet Take 500 mg by mouth every morning.      No current facility-administered medications for this visit.   Facility-Administered Medications Ordered in Other Visits  Medication Dose Route Frequency Provider Last Rate Last Admin  . 0.9 %  sodium chloride infusion   Intravenous Continuous Derek Jack, MD 20 mL/hr at 05/07/18 1447 New Bag at 05/07/18 1447     PHYSICAL EXAMINATION: Performance status (ECOG): 1 - Symptomatic but completely ambulatory  Vitals:   08/08/20 1451  BP: (!) 145/65  Pulse: 63  Resp: 18  Temp: (!) 97.3 F (36.3 C)  SpO2: 92%   Wt Readings from Last 3 Encounters:  08/08/20 178 lb 1.6 oz (80.8 kg)  03/09/20 179 lb (81.2 kg)  03/08/20 178 lb (80.7 kg)   Physical Exam Vitals reviewed.  Constitutional:      Appearance: Normal appearance. She is obese.  Cardiovascular:     Rate and Rhythm: Normal rate and regular rhythm.     Pulses: Normal pulses.     Heart sounds: Normal heart sounds.  Pulmonary:     Effort: Pulmonary effort is normal.     Breath sounds: Normal breath sounds.  Chest:  Breasts:     Right: No swelling, bleeding, inverted nipple, mass, nipple discharge, skin change, tenderness or supraclavicular adenopathy.     Left: No swelling, bleeding, inverted nipple, mass, nipple discharge, skin change, tenderness or supraclavicular adenopathy.    Musculoskeletal:     Right lower leg: No edema.     Left lower  leg: No edema.  Lymphadenopathy:     Cervical: No cervical adenopathy.     Upper Body:     Right upper body: No supraclavicular adenopathy.     Left upper body: No supraclavicular adenopathy.  Neurological:     General: No focal deficit present.     Mental Status: She is alert and oriented to person, place, and time.  Psychiatric:        Mood and Affect: Mood normal.        Behavior: Behavior normal.     Breast Exam Chaperone: Milinda Antis, MD     LABORATORY  DATA:  I have reviewed the data as listed CMP Latest Ref Rng & Units 03/01/2020 10/18/2019 08/30/2019  Glucose 70 - 99 mg/dL 142(H) 98 111(H)  BUN 8 - 23 mg/dL 23 33(H) 35(H)  Creatinine 0.44 - 1.00 mg/dL 1.27(H) 1.34(H) 1.39(H)  Sodium 135 - 145 mmol/L 137 141 140  Potassium 3.5 - 5.1 mmol/L 3.8 4.0 3.7  Chloride 98 - 111 mmol/L 100 103 104  CO2 22 - 32 mmol/L _0 Calcium 8.9 - 10.3 mg/dL 9.4 9.6 9.3  Total Protein 6.5 - 8.1 g/dL 7.0 - 7.0  Total Bilirubin 0.3 - 1.2 mg/dL 0.6 - 0.3  Alkaline Phos 38 - 126 U/L 109 - 103  AST 15 - 41 U/L 14(L) - 16  ALT 0 - 44 U/L 10 - 12   No results found for: [id number] Lab Results  Component Value Date   WBC 6.6 07/16/2020   HGB 12.2 07/16/2020   HCT 38.8 07/16/2020   MCV 97.7 07/16/2020   PLT 240 07/16/2020   NEUTROABS 4.3 07/16/2020   Lab Results  Component Value Date   VD25OH 50.47 07/16/2020   VD25OH 16.83 (L) 03/01/2020   VD25OH 25.8 (L) 11/19/2016    ASSESSMENT:  1. Stage I (PT 1 BP N0) right breast lobular carcinoma: -Status post lumpectomy on 09/20/2014, 0.6 cm, ER/PR positive, HER-2 negative by FISH, Ki-67 of 26%, Arimidex started on 09/27/2014. For some reason she did not receive radiation therapy. -She is tolerating anastrozole very well without any major problems. -Mammogram on 08/24/2018 was BI-RADS Category 2. -Mammogram on 08/30/2019 was BI-RADS Category 2.  2. Normocytic anemia: -This is from combination of CKD and iron deficiency state. -She  reportedly received 4 units of PRBC in March at [facility]. She had colonoscopy on 07/14/2017 at [facility] which was normal. -A capsule study on 08/26/2017 by Dr. Laural Golden showed few small bowel punctate AVM with speck of blood, no active bleeding. -Last Feraheme on 09/14/2019 and 09/21/2019.  3. Osteopenia: -DEXA scan on 08/13/2015 shows T score of -1.7 in the left femoral neck.  -DEXA scan on 04/15/2018 shows T score of -1.7 and stable.   PLAN:  1. Stage I (PT 1 BP N0) right breast lobular carcinoma: -Physical examination today shows a right breast upper outer quadrant scar with no palpable masses. -No palpable adenopathy.  Last mammogram was on 08/30/2019. -We will schedule her for mammogram after 08/29/2020. -RTC 6 months for follow-up.  2. Normocytic anemia: -Her hemoglobin is stable around 12.2 with ferritin of 304.  Percent saturation is 18. -No parenteral iron therapy needed.  Reevaluate in 6 months.  3. Osteopenia: -Continue vitamin D 50,000 units weekly.  Vitamin D level is 50.47.   Breast Cancer therapy associated bone loss: I have recommended calcium, Vitamin D and weight bearing exercises.  No orders of the defined types were placed in this encounter.  The patient has a good understanding of the overall plan. she agrees with it. she will call with any problems that may develop before the next visit here.    Derek Jack, MD [place] [phone]   I, Milinda Antis, am acting as a scribe for Dr. Sanda Linger.  I, Derek Jack MD, have reviewed the above documentation for accuracy and completeness, and I agree with the above.

## 2020-08-13 DIAGNOSIS — M1009 Idiopathic gout, multiple sites: Secondary | ICD-10-CM | POA: Diagnosis not present

## 2020-08-13 DIAGNOSIS — M5431 Sciatica, right side: Secondary | ICD-10-CM | POA: Diagnosis not present

## 2020-08-19 DIAGNOSIS — Z853 Personal history of malignant neoplasm of breast: Secondary | ICD-10-CM | POA: Diagnosis not present

## 2020-08-19 DIAGNOSIS — M7989 Other specified soft tissue disorders: Secondary | ICD-10-CM | POA: Diagnosis not present

## 2020-08-19 DIAGNOSIS — Z950 Presence of cardiac pacemaker: Secondary | ICD-10-CM | POA: Diagnosis not present

## 2020-08-19 DIAGNOSIS — I10 Essential (primary) hypertension: Secondary | ICD-10-CM | POA: Diagnosis not present

## 2020-08-19 DIAGNOSIS — W010XXA Fall on same level from slipping, tripping and stumbling without subsequent striking against object, initial encounter: Secondary | ICD-10-CM | POA: Diagnosis not present

## 2020-08-19 DIAGNOSIS — S82892A Other fracture of left lower leg, initial encounter for closed fracture: Secondary | ICD-10-CM | POA: Diagnosis not present

## 2020-08-22 DIAGNOSIS — S8262XA Displaced fracture of lateral malleolus of left fibula, initial encounter for closed fracture: Secondary | ICD-10-CM | POA: Diagnosis not present

## 2020-08-22 DIAGNOSIS — S82832A Other fracture of upper and lower end of left fibula, initial encounter for closed fracture: Secondary | ICD-10-CM | POA: Diagnosis not present

## 2020-08-22 DIAGNOSIS — Z96652 Presence of left artificial knee joint: Secondary | ICD-10-CM | POA: Diagnosis not present

## 2020-08-22 DIAGNOSIS — M25572 Pain in left ankle and joints of left foot: Secondary | ICD-10-CM | POA: Diagnosis not present

## 2020-08-22 DIAGNOSIS — S8252XA Displaced fracture of medial malleolus of left tibia, initial encounter for closed fracture: Secondary | ICD-10-CM | POA: Diagnosis not present

## 2020-08-30 DIAGNOSIS — S8262XA Displaced fracture of lateral malleolus of left fibula, initial encounter for closed fracture: Secondary | ICD-10-CM | POA: Diagnosis not present

## 2020-08-30 DIAGNOSIS — M25572 Pain in left ankle and joints of left foot: Secondary | ICD-10-CM | POA: Diagnosis not present

## 2020-09-03 ENCOUNTER — Other Ambulatory Visit (HOSPITAL_COMMUNITY): Payer: Self-pay | Admitting: Hematology

## 2020-09-03 ENCOUNTER — Ambulatory Visit (HOSPITAL_COMMUNITY): Payer: Medicare Other

## 2020-09-04 ENCOUNTER — Encounter (HOSPITAL_COMMUNITY): Payer: Medicare Other

## 2020-09-13 ENCOUNTER — Ambulatory Visit (INDEPENDENT_AMBULATORY_CARE_PROVIDER_SITE_OTHER): Payer: Medicare Other

## 2020-09-13 DIAGNOSIS — I442 Atrioventricular block, complete: Secondary | ICD-10-CM

## 2020-09-13 DIAGNOSIS — S8262XA Displaced fracture of lateral malleolus of left fibula, initial encounter for closed fracture: Secondary | ICD-10-CM | POA: Diagnosis not present

## 2020-09-14 LAB — CUP PACEART REMOTE DEVICE CHECK
Battery Impedance: 2257 Ohm
Battery Remaining Longevity: 25 mo
Battery Voltage: 2.73 V
Brady Statistic AP VP Percent: 46 %
Brady Statistic AP VS Percent: 0 %
Brady Statistic AS VP Percent: 54 %
Brady Statistic AS VS Percent: 0 %
Date Time Interrogation Session: 20220512114455
Implantable Lead Implant Date: 20150108
Implantable Lead Implant Date: 20150108
Implantable Lead Location: 753859
Implantable Lead Location: 753860
Implantable Lead Model: 5076
Implantable Lead Model: 5076
Implantable Pulse Generator Implant Date: 20150108
Lead Channel Impedance Value: 417 Ohm
Lead Channel Impedance Value: 437 Ohm
Lead Channel Pacing Threshold Amplitude: 0.625 V
Lead Channel Pacing Threshold Amplitude: 0.875 V
Lead Channel Pacing Threshold Pulse Width: 0.4 ms
Lead Channel Pacing Threshold Pulse Width: 0.4 ms
Lead Channel Setting Pacing Amplitude: 2 V
Lead Channel Setting Pacing Amplitude: 2.5 V
Lead Channel Setting Pacing Pulse Width: 0.4 ms
Lead Channel Setting Sensing Sensitivity: 2 mV

## 2020-09-27 DIAGNOSIS — S8262XA Displaced fracture of lateral malleolus of left fibula, initial encounter for closed fracture: Secondary | ICD-10-CM | POA: Diagnosis not present

## 2020-10-05 NOTE — Progress Notes (Signed)
Remote pacemaker transmission.   

## 2020-10-09 DIAGNOSIS — S8262XA Displaced fracture of lateral malleolus of left fibula, initial encounter for closed fracture: Secondary | ICD-10-CM | POA: Diagnosis not present

## 2020-11-12 ENCOUNTER — Other Ambulatory Visit: Payer: Self-pay

## 2020-11-12 ENCOUNTER — Ambulatory Visit (HOSPITAL_COMMUNITY)
Admission: RE | Admit: 2020-11-12 | Discharge: 2020-11-12 | Disposition: A | Discharge status: Home / Self Care | Payer: Medicare Other | Source: Ambulatory Visit | Attending: Hematology | Admitting: Hematology

## 2020-11-12 DIAGNOSIS — Z1231 Encounter for screening mammogram for malignant neoplasm of breast: Secondary | ICD-10-CM | POA: Diagnosis not present

## 2020-11-19 DIAGNOSIS — S8262XA Displaced fracture of lateral malleolus of left fibula, initial encounter for closed fracture: Secondary | ICD-10-CM | POA: Diagnosis not present

## 2020-11-22 DIAGNOSIS — E7849 Other hyperlipidemia: Secondary | ICD-10-CM | POA: Diagnosis not present

## 2020-11-22 DIAGNOSIS — M1009 Idiopathic gout, multiple sites: Secondary | ICD-10-CM | POA: Diagnosis not present

## 2020-11-22 DIAGNOSIS — Z0000 Encounter for general adult medical examination without abnormal findings: Secondary | ICD-10-CM | POA: Diagnosis not present

## 2020-11-22 DIAGNOSIS — I10 Essential (primary) hypertension: Secondary | ICD-10-CM | POA: Diagnosis not present

## 2020-11-22 DIAGNOSIS — M5431 Sciatica, right side: Secondary | ICD-10-CM | POA: Diagnosis not present

## 2020-12-10 DIAGNOSIS — M810 Age-related osteoporosis without current pathological fracture: Secondary | ICD-10-CM | POA: Diagnosis not present

## 2020-12-10 DIAGNOSIS — Z780 Asymptomatic menopausal state: Secondary | ICD-10-CM | POA: Diagnosis not present

## 2020-12-10 DIAGNOSIS — M85851 Other specified disorders of bone density and structure, right thigh: Secondary | ICD-10-CM | POA: Diagnosis not present

## 2020-12-10 DIAGNOSIS — M85852 Other specified disorders of bone density and structure, left thigh: Secondary | ICD-10-CM | POA: Diagnosis not present

## 2020-12-14 ENCOUNTER — Other Ambulatory Visit (HOSPITAL_COMMUNITY): Payer: Self-pay | Admitting: Hematology

## 2020-12-14 ENCOUNTER — Other Ambulatory Visit (HOSPITAL_COMMUNITY): Payer: Self-pay | Admitting: Hematology and Oncology

## 2020-12-14 DIAGNOSIS — C50919 Malignant neoplasm of unspecified site of unspecified female breast: Secondary | ICD-10-CM

## 2020-12-16 DIAGNOSIS — I700 Atherosclerosis of aorta: Secondary | ICD-10-CM | POA: Diagnosis not present

## 2020-12-16 DIAGNOSIS — Z950 Presence of cardiac pacemaker: Secondary | ICD-10-CM | POA: Diagnosis not present

## 2020-12-16 DIAGNOSIS — R059 Cough, unspecified: Secondary | ICD-10-CM | POA: Diagnosis not present

## 2020-12-16 DIAGNOSIS — I5023 Acute on chronic systolic (congestive) heart failure: Secondary | ICD-10-CM | POA: Diagnosis not present

## 2020-12-16 DIAGNOSIS — Z20822 Contact with and (suspected) exposure to covid-19: Secondary | ICD-10-CM | POA: Diagnosis not present

## 2020-12-16 DIAGNOSIS — R9431 Abnormal electrocardiogram [ECG] [EKG]: Secondary | ICD-10-CM | POA: Diagnosis not present

## 2020-12-16 DIAGNOSIS — I160 Hypertensive urgency: Secondary | ICD-10-CM | POA: Diagnosis not present

## 2020-12-16 DIAGNOSIS — R918 Other nonspecific abnormal finding of lung field: Secondary | ICD-10-CM | POA: Diagnosis not present

## 2020-12-16 DIAGNOSIS — R600 Localized edema: Secondary | ICD-10-CM | POA: Diagnosis not present

## 2020-12-16 DIAGNOSIS — I509 Heart failure, unspecified: Secondary | ICD-10-CM | POA: Diagnosis not present

## 2020-12-16 DIAGNOSIS — Z853 Personal history of malignant neoplasm of breast: Secondary | ICD-10-CM | POA: Diagnosis not present

## 2020-12-16 DIAGNOSIS — J90 Pleural effusion, not elsewhere classified: Secondary | ICD-10-CM | POA: Diagnosis not present

## 2020-12-16 DIAGNOSIS — I110 Hypertensive heart disease with heart failure: Secondary | ICD-10-CM | POA: Diagnosis not present

## 2020-12-17 DIAGNOSIS — I700 Atherosclerosis of aorta: Secondary | ICD-10-CM | POA: Diagnosis not present

## 2020-12-17 DIAGNOSIS — I5033 Acute on chronic diastolic (congestive) heart failure: Secondary | ICD-10-CM | POA: Diagnosis not present

## 2021-01-01 DIAGNOSIS — I5033 Acute on chronic diastolic (congestive) heart failure: Secondary | ICD-10-CM | POA: Diagnosis not present

## 2021-01-03 DIAGNOSIS — J40 Bronchitis, not specified as acute or chronic: Secondary | ICD-10-CM | POA: Diagnosis not present

## 2021-01-15 DIAGNOSIS — I2723 Pulmonary hypertension due to lung diseases and hypoxia: Secondary | ICD-10-CM | POA: Diagnosis not present

## 2021-01-15 DIAGNOSIS — R0602 Shortness of breath: Secondary | ICD-10-CM | POA: Diagnosis not present

## 2021-01-15 DIAGNOSIS — I509 Heart failure, unspecified: Secondary | ICD-10-CM | POA: Diagnosis not present

## 2021-01-16 DIAGNOSIS — M810 Age-related osteoporosis without current pathological fracture: Secondary | ICD-10-CM | POA: Diagnosis not present

## 2021-01-22 ENCOUNTER — Ambulatory Visit (INDEPENDENT_AMBULATORY_CARE_PROVIDER_SITE_OTHER): Payer: Medicare Other | Admitting: Internal Medicine

## 2021-01-22 ENCOUNTER — Ambulatory Visit (HOSPITAL_COMMUNITY)
Admission: RE | Admit: 2021-01-22 | Discharge: 2021-01-22 | Disposition: A | Discharge status: Home / Self Care | Payer: Medicare Other | Source: Ambulatory Visit | Attending: Internal Medicine | Admitting: Internal Medicine

## 2021-01-22 ENCOUNTER — Other Ambulatory Visit: Payer: Self-pay

## 2021-01-22 ENCOUNTER — Encounter: Payer: Self-pay | Admitting: Internal Medicine

## 2021-01-22 VITALS — BP 122/78 | HR 72 | Temp 98.0°F | Ht 63.0 in | Wt 177.8 lb

## 2021-01-22 DIAGNOSIS — M7989 Other specified soft tissue disorders: Secondary | ICD-10-CM | POA: Diagnosis not present

## 2021-01-22 DIAGNOSIS — I10 Essential (primary) hypertension: Secondary | ICD-10-CM | POA: Diagnosis not present

## 2021-01-22 DIAGNOSIS — I517 Cardiomegaly: Secondary | ICD-10-CM | POA: Diagnosis not present

## 2021-01-22 DIAGNOSIS — J811 Chronic pulmonary edema: Secondary | ICD-10-CM | POA: Diagnosis not present

## 2021-01-22 DIAGNOSIS — R0600 Dyspnea, unspecified: Secondary | ICD-10-CM

## 2021-01-22 DIAGNOSIS — Z23 Encounter for immunization: Secondary | ICD-10-CM

## 2021-01-22 DIAGNOSIS — R058 Other specified cough: Secondary | ICD-10-CM | POA: Diagnosis not present

## 2021-01-22 DIAGNOSIS — R0609 Other forms of dyspnea: Secondary | ICD-10-CM | POA: Insufficient documentation

## 2021-01-22 MED ORDER — OLMESARTAN MEDOXOMIL 40 MG PO TABS: 40.0000 mg | ORAL_TABLET | Freq: Every day | ORAL | 11 refills | Status: DC

## 2021-01-22 NOTE — Assessment & Plan Note (Addendum)
Changed acei to ARB 01/22/2021 due to cough   ACE inhibitors are problematic in  pts with airway complaints because  even experienced pulmonologists can't always distinguish ace effects from copd/asthma.  By themselves they don't actually cause a problem, much like oxygen can't by itself start a fire, but they certainly serve as a powerful catalyst or enhancer for any "fire"  or inflammatory process in the upper airway, be it caused by an ET  tube or more commonly reflux (especially in the obese or pts with known GERD or who are on biphoshonates).    In the era of ARB near equivalency until we have a better handle on the reversibility of the airway problem, it just makes sense to avoid ACEI  entirely in the short run and then decide later, having established a level of airway control using a reasonable limited regimen, whether to add back ace but even then being very careful to observe the pt for worsening airway control and number of meds used/ needed to control symptoms.  >>> rec trial of olmesartan 40 mg daily and f/u in 6 weeks     Each maintenance medication was reviewed in detail including emphasizing most importantly the difference between maintenance and prns and under what circumstances the prns are to be triggered using an action plan format where appropriate.  Total time for H and P, chart review, counseling,  directly observing portions of ambulatory 02 saturation study/ and generating customized AVS unique to this office visit / same day charting > 60 min

## 2021-01-22 NOTE — Assessment & Plan Note (Addendum)
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   If still in doubt, would do RHC before considering any specific rx for [facility]

## 2021-01-22 NOTE — Patient Instructions (Addendum)
Stop benazapril (lotensin)  and start olmesartan 40 mg one daily and cough should gradually resolve  We will schedule venous dopplers of both legs and can be done in [place]  Call me with the name the test you have in [place] so I can look up the results   Please remember to go to the  x-ray department  @  [facility] for your tests - we will call you with the results when they are available     Please schedule a follow up office visit in 6 weeks, call sooner if needed

## 2021-01-22 NOTE — Assessment & Plan Note (Signed)
Onset ? p covid Dec 2021 on ACEi  - try off acei 01/22/2021   Upper airway cough syndrome (previously labeled PNDS),  is so named because it's frequently impossible to sort out how much is  CR/sinusitis with freq throat clearing (which can be related to primary GERD)   vs  causing  secondary (" extra esophageal")  GERD from wide swings in gastric pressure that occur with throat clearing, often  promoting self use of mint and menthol lozenges that reduce the lower esophageal sphincter tone and exacerbate the problem further in a cyclical fashion.   These are the same pts (now being labeled as having "irritable larynx syndrome" by some cough centers) who not infrequently have a history of having failed to tolerate ace inhibitors (may be the case here) ,  dry powder inhalers or biphosphonates or report having atypical/extraesophageal reflux symptoms that don't respond to standard doses of PPI (which appears to be the case here)   and are easily confused as having aecopd or asthma flares by even experienced allergists/ pulmonologists (myself included).    >>> rec continue gerd rx/ try off acei (see hbp)

## 2021-01-22 NOTE — Progress Notes (Signed)
Alicia Nash, female    DOB: August 13, 1940    MRN: [id number]   Brief patient profile:  17 yowf never smoker  referred to pulmonary clinic in [place]  01/22/2021 by Dr Zada Girt for ? Pulmonary hypertension/ chronic dry cough.   History of Present Illness  01/22/2021  Pulmonary/ 1st office eval/ Alicia Nash / [facility]  Chief Complaint  Patient presents with   Consult    2 months ago experienced Sob briefly, went to ER, was referred by pcp. States she has not had anymore SOB since ER visit. Persistent dry cough. Hx of breast cancer, pulmonary htn, Hosp. admission 12/16/2020 pleural effusion   Baseline good activity tolerance limited more by knees then two days prior to covid dx was Dec 2021 cough and aching rx tylenol only and remained weak but no cough / sob, fell and broke L ankle Easter Sunday > cast only  remained fatigued limited ankle then more sob in December 16, 2020 ER [name] > dx was ? PAH > eliquis 2.5 mg bid and seemed better Dyspnea:  no problem  Cough:  nagging cough/ dry more daytime assoc with gen ant  burning in  upper chest neg resp to HB rx,   rec was to take trelegy but hasn't started it yet  Sleep: on a pillow flat bed x years  SABA use: none   No obvious day to day or daytime variability or assoc excess/ purulent sputum or mucus plugs or hemoptysis  chest tightness, subjective wheeze or overt sinus or hb symptoms.   Sleepoing as above  without nocturnal  or early am exacerbation  of respiratory  c/o's or need for noct saba. Also denies any obvious fluctuation of symptoms with weather or environmental changes or other aggravating or alleviating factors except as outlined above   No unusual exposure hx or h/o childhood pna/ asthma or knowledge of premature birth.  Current Allergies, Complete Past Medical History, Past Surgical History, Family History, and Social History were reviewed in [employer] record.  ROS  The following are not active  complaints unless bolded Hoarseness, sore throat, dysphagia, dental problems, itching, sneezing,  nasal congestion or discharge of excess mucus or purulent secretions, ear ache,   fever, chills, sweats, unintended wt loss or wt gain, classically pleuritic or exertional cp,  orthopnea pnd or arm/hand swelling  or leg swelling, presyncope, palpitations, abdominal pain, anorexia, nausea, vomiting, diarrhea  or change in bowel habits or change in bladder habits, change in stools or change in urine, dysuria, hematuria,  rash, arthralgias, visual complaints, headache, numbness, weakness or ataxia or problems with walking or coordination,  change in mood or  memory.            Past Medical History:  Diagnosis Date   Arthritis    Breast cancer ([facility])    Cellulitis    Right leg   CHF (congestive heart failure) ([place])    Hyperlipidemia    Hypertension    Lobular carcinoma of right breast ([place]) 09/27/2014   Malignant neoplasm of upper-outer quadrant of right female breast ([facility]) 09/27/2014   Paroxysmal atrial fibrillation ([facility])    Varicose veins    venous stasis skin changes    Outpatient Medications Prior to Visit  Medication Sig Dispense Refill   acetaminophen (TYLENOL) 500 MG tablet Take 500 mg by mouth daily as needed for mild pain or moderate pain.      albuterol (VENTOLIN HFA) 108 (90 Base) MCG/ACT inhaler  anastrozole (ARIMIDEX) 1 MG tablet TAKE ONE TABLET BY MOUTH DAILY 30 tablet 5   apixaban (ELIQUIS) 2.5 MG TABS tablet Take 1 tablet (2.5 mg total) by mouth 2 (two) times daily. 60 tablet 1   benazepril (LOTENSIN) 40 MG tablet Take 1 tablet by mouth every morning.      carvedilol (COREG) 6.25 MG tablet      cetirizine (ZYRTEC) 10 MG tablet Take 10 mg by mouth daily as needed for allergies.   0   ciprofloxacin (CIPRO) 500 MG tablet Take 500 mg by mouth 2 (two) times daily.     citalopram (CELEXA) 20 MG tablet Take 20 mg by mouth every morning.      Colchicine 0.6 MG CAPS Take 1 capsule by  mouth 2 (two) times daily.     Cranberry 400 MG CAPS Take 1 tablet by mouth every morning.      diazepam (VALIUM) 5 MG tablet Take 1 tablet 2 (two) times daily by mouth.     docusate sodium (COLACE) 100 MG capsule Take 100 mg by mouth every morning.     furosemide (LASIX) 40 MG tablet Take 1 tablet by mouth every morning.      gemfibrozil (LOPID) 600 MG tablet Take 1 tablet by mouth every morning.      hydrALAZINE (APRESOLINE) 25 MG tablet Take 25 mg by mouth 2 (two) times daily.     Multiple Vitamin (MULTIVITAMIN) tablet Take 1 tablet by mouth every morning.      pantoprazole (PROTONIX) 40 MG tablet Take 1 tablet (40 mg total) by mouth daily. 30 tablet 4   PNEUMOVAX 23 25 MCG/0.5ML injection      silver sulfADIAZINE (SILVADENE) 1 % cream Apply topically See admin instructions.     triamcinolone cream (KENALOG) 0.1 % APPLY A THIN LAYER TO THE AFFECTED AREAS DAILY     ULORIC 40 MG tablet Take 1 tablet by mouth every morning.      verapamil (VERELAN PM) 360 MG 24 hr capsule Take 360 mg by mouth every morning.      vitamin C (ASCORBIC ACID) 500 MG tablet Take 500 mg by mouth every morning.      Vitamin D, Ergocalciferol, (DRISDOL) 1.25 MG (50000 UNIT) CAPS capsule TAKE ONE CAPSULE BY MOUTH ONCE A WEEK. 12 capsule 2   Facility-Administered Medications Prior to Visit  Medication Dose Route Frequency Provider Last Rate Last Admin   0.9 %  sodium chloride infusion   Intravenous Continuous Derek Jack, MD 20 mL/hr at 05/07/18 1447 New Bag at 05/07/18 1447     Objective:     BP 122/78 (BP Location: Left Arm, Patient Position: Sitting)   Pulse 72   Temp 98 F (36.7 C) (Temporal)   Ht _0  (1.6 m)   Wt 177 lb 12.8 oz (80.6 kg)   SpO2 98% Comment: RA  BMI 31.50 kg/m   SpO2: 98 % (RA)  Amb pleasant wf nad   HEENT : pt wearing mask not removed for exam due to covid -19 concerns.    NECK :  without JVD/Nodes/TM/ nl carotid upstrokes bilaterally   LUNGS: no acc muscle use,  Nl  contour chest which is clear to A and P bilaterally without cough on insp or exp maneuvers   CV:  RRR  no s3 or murmur or increase in P2, and no edema   ABD:  soft and nontender with nl inspiratory excursion in the supine position. No bruits or organomegaly appreciated, bowel sounds nl  MS:  Nl gait/ ext warm without deformities, calf tenderness, cyanosis or clubbing No obvious joint restrictions   SKIN: warm and dry without lesions    NEURO:  alert, approp, nl sensorium with  no motor or cerebellar deficits apparent.     I personally reviewed images and agree with radiology impression as follows:  CXR:   12/16/20 [place] Cardiomegaly and mild diffuse bilateral interstitial opacities which likely represent pulmonary edema. Trace left pleural effusion.  CXR PA and Lateral:   01/22/2021 :    I personally reviewed images and agree with radiology impression as follows:    Cardiomegaly with pulmonary venous congestion, but no frank pulmonary edema.    Assessment   Upper airway cough syndrome Onset ? p covid Dec 2021 on ACEi  - try off acei 01/22/2021   Upper airway cough syndrome (previously labeled PNDS),  is so named because it's frequently impossible to sort out how much is  CR/sinusitis with freq throat clearing (which can be related to primary GERD)   vs  causing  secondary (" extra esophageal")  GERD from wide swings in gastric pressure that occur with throat clearing, often  promoting self use of mint and menthol lozenges that reduce the lower esophageal sphincter tone and exacerbate the problem further in a cyclical fashion.   These are the same pts (now being labeled as having "irritable larynx syndrome" by some cough centers) who not infrequently have a history of having failed to tolerate ace inhibitors (may be the case here) ,  dry powder inhalers or biphosphonates or report having atypical/extraesophageal reflux symptoms that don't respond to standard doses of PPI (which appears  to be the case here)   and are easily confused as having aecopd or asthma flares by even experienced allergists/ pulmonologists (myself included).    >>> rec continue gerd rx/ try off acei (see hbp)    DOE (dyspnea on exertion) Onset aug 2022 with mild edema on CXR 12/16/20  - Echo 9/38/10 Grade 2 diastolic dysfunction /mod MR, severe LAE / mod RAE with severe PH  - 01/22/2021   Walked on RA  x  3  lap(s) =  approx 450 ft @ moderately fast pace, stopped due to end of study s sob with lowest 02 sats 100%   Most likely the [place] is from the the L heart problems ie WHO II  Though I do worry about PE in pt off eliquis (by her report) p she fx'd her ankle so would do Venous dopplers to be sure no evidence of DVT on her low dose eliquis for now and repeat the echo p optimal vol status/ bp control achieved.   If still in doubt, would do RHC before considering any specific rx for [facility]    Essential hypertension Changed acei to ARB 01/22/2021 due to cough   ACE inhibitors are problematic in  pts with airway complaints because  even experienced pulmonologists can't always distinguish ace effects from copd/asthma.  By themselves they don't actually cause a problem, much like oxygen can't by itself start a fire, but they certainly serve as a powerful catalyst or enhancer for any "fire"  or inflammatory process in the upper airway, be it caused by an ET  tube or more commonly reflux (especially in the obese or pts with known GERD or who are on biphoshonates).    In the era of ARB near equivalency until we have a better handle on the reversibility of the airway problem, it just  makes sense to avoid ACEI  entirely in the short run and then decide later, having established a level of airway control using a reasonable limited regimen, whether to add back ace but even then being very careful to observe the pt for worsening airway control and number of meds used/ needed to control symptoms.  >>> rec trial of olmesartan  40 mg daily and f/u in 6 weeks     Each maintenance medication was reviewed in detail including emphasizing most importantly the difference between maintenance and prns and under what circumstances the prns are to be triggered using an action plan format where appropriate.  Total time for H and P, chart review, counseling,  directly observing portions of ambulatory 02 saturation study/ and generating customized AVS unique to this office visit / same day charting > 60 min                       Christinia Gully, MD 01/22/2021

## 2021-01-23 ENCOUNTER — Other Ambulatory Visit: Payer: Self-pay | Admitting: Internal Medicine

## 2021-01-23 DIAGNOSIS — M7989 Other specified soft tissue disorders: Secondary | ICD-10-CM

## 2021-01-28 ENCOUNTER — Ambulatory Visit (HOSPITAL_COMMUNITY)
Admission: RE | Admit: 2021-01-28 | Discharge: 2021-01-28 | Disposition: A | Discharge status: Home / Self Care | Payer: Medicare Other | Source: Ambulatory Visit | Attending: Internal Medicine | Admitting: Internal Medicine

## 2021-01-28 ENCOUNTER — Other Ambulatory Visit: Payer: Self-pay

## 2021-01-28 DIAGNOSIS — M7989 Other specified soft tissue disorders: Secondary | ICD-10-CM | POA: Diagnosis not present

## 2021-01-28 DIAGNOSIS — R600 Localized edema: Secondary | ICD-10-CM | POA: Diagnosis not present

## 2021-01-29 DIAGNOSIS — I2723 Pulmonary hypertension due to lung diseases and hypoxia: Secondary | ICD-10-CM | POA: Diagnosis not present

## 2021-01-29 DIAGNOSIS — I7411 Embolism and thrombosis of thoracic aorta: Secondary | ICD-10-CM | POA: Diagnosis not present

## 2021-01-29 DIAGNOSIS — M79604 Pain in right leg: Secondary | ICD-10-CM | POA: Diagnosis not present

## 2021-01-29 DIAGNOSIS — R918 Other nonspecific abnormal finding of lung field: Secondary | ICD-10-CM | POA: Diagnosis not present

## 2021-01-29 DIAGNOSIS — I700 Atherosclerosis of aorta: Secondary | ICD-10-CM | POA: Diagnosis not present

## 2021-01-29 DIAGNOSIS — R911 Solitary pulmonary nodule: Secondary | ICD-10-CM | POA: Diagnosis not present

## 2021-01-29 DIAGNOSIS — I712 Thoracic aortic aneurysm, without rupture: Secondary | ICD-10-CM | POA: Diagnosis not present

## 2021-02-01 DIAGNOSIS — K2100 Gastro-esophageal reflux disease with esophagitis, without bleeding: Secondary | ICD-10-CM | POA: Diagnosis not present

## 2021-02-01 DIAGNOSIS — I10 Essential (primary) hypertension: Secondary | ICD-10-CM | POA: Diagnosis not present

## 2021-02-06 ENCOUNTER — Inpatient Hospital Stay (HOSPITAL_COMMUNITY): Payer: Medicare Other | Attending: Hematology

## 2021-02-06 ENCOUNTER — Other Ambulatory Visit: Payer: Self-pay

## 2021-02-06 DIAGNOSIS — D509 Iron deficiency anemia, unspecified: Secondary | ICD-10-CM | POA: Insufficient documentation

## 2021-02-06 DIAGNOSIS — Z170 Estrogen receptor positive status [ER+]: Secondary | ICD-10-CM | POA: Insufficient documentation

## 2021-02-06 DIAGNOSIS — C50911 Malignant neoplasm of unspecified site of right female breast: Secondary | ICD-10-CM | POA: Insufficient documentation

## 2021-02-06 DIAGNOSIS — M8580 Other specified disorders of bone density and structure, unspecified site: Secondary | ICD-10-CM | POA: Insufficient documentation

## 2021-02-06 DIAGNOSIS — D631 Anemia in chronic kidney disease: Secondary | ICD-10-CM | POA: Diagnosis not present

## 2021-02-06 DIAGNOSIS — D508 Other iron deficiency anemias: Secondary | ICD-10-CM

## 2021-02-06 DIAGNOSIS — N189 Chronic kidney disease, unspecified: Secondary | ICD-10-CM | POA: Insufficient documentation

## 2021-02-06 LAB — IRON AND TIBC
Iron: 23 ug/dL — ABNORMAL LOW (ref 28–170)
Saturation Ratios: 8 % — ABNORMAL LOW (ref 10.4–31.8)
TIBC: 291 ug/dL (ref 250–450)
UIBC: 268 ug/dL

## 2021-02-06 LAB — COMPREHENSIVE METABOLIC PANEL
ALT: 12 U/L (ref 0–44)
AST: 16 U/L (ref 15–41)
Albumin: 3.6 g/dL (ref 3.5–5.0)
Alkaline Phosphatase: 98 U/L (ref 38–126)
Anion gap: 8 (ref 5–15)
BUN: 14 mg/dL (ref 8–23)
CO2: 28 mmol/L (ref 22–32)
Calcium: 8.2 mg/dL — ABNORMAL LOW (ref 8.9–10.3)
Chloride: 106 mmol/L (ref 98–111)
Creatinine, Ser: 0.91 mg/dL (ref 0.44–1.00)
GFR, Estimated: >60 mL/min (ref >60)
Glucose, Bld: 146 mg/dL — ABNORMAL HIGH (ref 70–99)
Potassium: 3.7 mmol/L (ref 3.5–5.1)
Sodium: 142 mmol/L (ref 135–145)
Total Bilirubin: 0.8 mg/dL (ref 0.3–1.2)
Total Protein: 7 g/dL (ref 6.5–8.1)

## 2021-02-06 LAB — CBC WITH DIFFERENTIAL/PLATELET
Abs Immature Granulocytes: 0.02 10*3/uL (ref 0.00–0.07)
Basophils Absolute: 0.1 10*3/uL (ref 0.0–0.1)
Basophils Relative: 1 %
Eosinophils Absolute: 0.2 10*3/uL (ref 0.0–0.5)
Eosinophils Relative: 3 %
HCT: 34.2 % — ABNORMAL LOW (ref 36.0–46.0)
Hemoglobin: 10.6 g/dL — ABNORMAL LOW (ref 12.0–15.0)
Immature Granulocytes: 0 %
Lymphocytes Relative: 13 %
Lymphs Abs: 1 10*3/uL (ref 0.7–4.0)
MCH: 30.2 pg (ref 26.0–34.0)
MCHC: 31 g/dL (ref 30.0–36.0)
MCV: 97.4 fL (ref 80.0–100.0)
Monocytes Absolute: 0.6 10*3/uL (ref 0.1–1.0)
Monocytes Relative: 8 %
Neutro Abs: 5.9 10*3/uL (ref 1.7–7.7)
Neutrophils Relative %: 75 %
Platelets: 275 10*3/uL (ref 150–400)
RBC: 3.51 MIL/uL — ABNORMAL LOW (ref 3.87–5.11)
RDW: 14.4 % (ref 11.5–15.5)
WBC: 7.8 10*3/uL (ref 4.0–10.5)
nRBC: 0 % (ref 0.0–0.2)

## 2021-02-06 LAB — VITAMIN D 25 HYDROXY (VIT D DEFICIENCY, FRACTURES): Vit D, 25-Hydroxy: 43.91 ng/mL (ref 30–100)

## 2021-02-06 LAB — FERRITIN: Ferritin: 231 ng/mL (ref 11–307)

## 2021-02-07 ENCOUNTER — Inpatient Hospital Stay (HOSPITAL_COMMUNITY): Payer: Medicare Other

## 2021-02-13 NOTE — Progress Notes (Signed)
[facility] [address], [place] [address]   Patient Care Team: [name], MD as PCP - General (Unknown Physician Specialty) Thompson Grayer, MD as PCP - Electrophysiology (Cardiology) Garald Balding, MD (Orthopedic Surgery) Cristal Deer, DPM (Podiatry)  SUMMARY OF ONCOLOGIC HISTORY: Oncology History  Lobular carcinoma of right breast ([facility])  08/30/2014 Mammogram   Ill-defined shadowing mass at 10:00 position 6 cm from the nipple measuring [phone] cm in size without lymphadenopathy seen in the right axilla.   09/06/2014 Procedure   Biopsy of right breast mass at 10 o'clock position.   09/08/2014 Pathology Results   Invasive carcinoma, favor invasive ductal carcinoma.  Some features are suggestive of lobular carcinoma.  Negative e-cadherin immunostaining throughout the lesional cells supports the diagnosis of invasive lobular carcinoma.   09/08/2014 Pathology Results   ER 90%, PR 90%. HER2 FISH negative, Ki-67 26%.   09/20/2014 Definitive Surgery   Lumpectomy   09/20/2014 Pathology Results   Invasive lobular carcinoma, 0.6 cm in maximal dimension, closest margin is 0.3 cm at posterior resection margin. Grade 2. No LV I. 0/1 lymph nodes.   09/27/2014 -  Anti-estrogen oral therapy   Arimidex   08/08/2015 Mammogram   BIRADS 2   09/20/2015 Procedure   Right breast lumpectomy by Dr. Anthony Sar with sentinel lymph node biopsy.     CHIEF COMPLIANT: Follow-up for right breast cancer & anemia   INTERVAL HISTORY: Alicia Nash is a 80 y.o. female here today for follow up of her right breast cancer & anemia. Her last visit was on 08/08/20.   Today she reports feeling well. She is taking anastrozole and tolerating it well. She denies current bleeding, and she reports occasional black stools. She has not required a blood transfusion in the past 6 months.   REVIEW OF SYSTEMS:   Review of Systems  Constitutional:  Positive for fatigue. Negative for  appetite change.  HENT:   Negative for nosebleeds.   Respiratory:  Positive for cough and shortness of breath. Negative for hemoptysis.   Cardiovascular:  Positive for chest pain (CHF).  Gastrointestinal:  Positive for nausea. Negative for blood in stool.  Genitourinary:  Negative for hematuria and vaginal bleeding.   Musculoskeletal:  Positive for arthralgias (4/10 knee).  Neurological:  Positive for numbness.  Hematological:  Does not bruise/bleed easily.  All other systems reviewed and are negative.  I have reviewed the past medical history, past surgical history, social history and family history with the patient and they are unchanged from previous note.   ALLERGIES:   is allergic to dihydrocodeine and other.   MEDICATIONS:  Current Outpatient Medications  Medication Sig Dispense Refill   acetaminophen (TYLENOL) 500 MG tablet Take 500 mg by mouth daily as needed for mild pain or moderate pain.      albuterol (VENTOLIN HFA) 108 (90 Base) MCG/ACT inhaler      anastrozole (ARIMIDEX) 1 MG tablet TAKE ONE TABLET BY MOUTH DAILY 30 tablet 5   apixaban (ELIQUIS) 2.5 MG TABS tablet Take 1 tablet (2.5 mg total) by mouth 2 (two) times daily. 60 tablet 1   carvedilol (COREG) 6.25 MG tablet      ciprofloxacin (CIPRO) 500 MG tablet Take 500 mg by mouth 2 (two) times daily.     citalopram (CELEXA) 20 MG tablet Take 20 mg by mouth every morning.      Colchicine 0.6 MG CAPS Take 1 capsule by mouth 2 (two) times daily.  Cranberry 400 MG CAPS Take 1 tablet by mouth every morning.      diazepam (VALIUM) 5 MG tablet Take 1 tablet 2 (two) times daily by mouth.     diazepam (VALIUM) 5 MG tablet Take by mouth.     docusate sodium (COLACE) 100 MG capsule Take 100 mg by mouth every morning.     furosemide (LASIX) 40 MG tablet Take 1 tablet by mouth every morning.      gemfibrozil (LOPID) 600 MG tablet Take 1 tablet by mouth every morning.      hydrALAZINE (APRESOLINE) 25 MG tablet Take 25 mg by  mouth 2 (two) times daily.     Multiple Vitamin (MULTIVITAMIN) tablet Take 1 tablet by mouth every morning.      olmesartan (BENICAR) 40 MG tablet Take 1 tablet (40 mg total) by mouth daily. 30 tablet 11   pantoprazole (PROTONIX) 40 MG tablet Take 1 tablet (40 mg total) by mouth daily. 30 tablet 4   pantoprazole (PROTONIX) 40 MG tablet Take by mouth.     PNEUMOVAX 23 25 MCG/0.5ML injection      Promethazine HCl 6.25 MG/5ML SOLN Take 5 mLs by mouth every 6 (six) hours as needed.     rosuvastatin (CRESTOR) 10 MG tablet Take 10 mg by mouth daily.     silver sulfADIAZINE (SILVADENE) 1 % cream Apply topically See admin instructions.     sodium bicarbonate 650 MG tablet Take by mouth.     traMADol (ULTRAM) 50 MG tablet Take 50 mg by mouth every 6 (six) hours as needed.     triamcinolone cream (KENALOG) 0.1 % APPLY A THIN LAYER TO THE AFFECTED AREAS DAILY     ULORIC 40 MG tablet Take 1 tablet by mouth every morning.      verapamil (VERELAN PM) 360 MG 24 hr capsule Take 360 mg by mouth every morning.      vitamin C (ASCORBIC ACID) 500 MG tablet Take 500 mg by mouth every morning.      Vitamin D, Ergocalciferol, (DRISDOL) 1.25 MG (50000 UNIT) CAPS capsule TAKE ONE CAPSULE BY MOUTH ONCE A WEEK. 12 capsule 2   cetirizine (ZYRTEC) 10 MG tablet Take 10 mg by mouth daily as needed for allergies.  (Patient not taking: Reported on 02/14/2021)  0   No current facility-administered medications for this visit.   Facility-Administered Medications Ordered in Other Visits  Medication Dose Route Frequency Provider Last Rate Last Admin   0.9 %  sodium chloride infusion   Intravenous Continuous Derek Jack, MD 20 mL/hr at 05/07/18 1447 New Bag at 05/07/18 1447     PHYSICAL EXAMINATION: Performance status (ECOG): 1 - Symptomatic but completely ambulatory  Vitals:   02/14/21 1533  BP: (!) 169/81  Pulse: 66  Resp: 18  Temp: 98.3 F (36.8 C)  SpO2: 95%   Wt Readings from Last 3 Encounters:   02/14/21 176 lb (79.8 kg)  01/22/21 177 lb 12.8 oz (80.6 kg)  08/08/20 178 lb 1.6 oz (80.8 kg)   Physical Exam Vitals reviewed.  Constitutional:      Appearance: Normal appearance.  Cardiovascular:     Rate and Rhythm: Normal rate and regular rhythm.     Pulses: Normal pulses.     Heart sounds: Normal heart sounds.  Pulmonary:     Effort: Pulmonary effort is normal.     Breath sounds: Normal breath sounds.  Chest:  Breasts:    Right: Normal. No inverted nipple, mass, nipple discharge, skin change (  UOQ lumpectomy scar within normal limits) or tenderness.     Left: Normal. No inverted nipple, mass, nipple discharge, skin change or tenderness.  Abdominal:     Palpations: Abdomen is soft. There is no hepatomegaly, splenomegaly or mass.     Tenderness: There is no abdominal tenderness.  Musculoskeletal:     Right lower leg: No edema.     Left lower leg: No edema.  Lymphadenopathy:     Upper Body:     Right upper body: No supraclavicular, axillary or pectoral adenopathy.     Left upper body: No supraclavicular, axillary or pectoral adenopathy.  Neurological:     General: No focal deficit present.     Mental Status: She is alert and oriented to person, place, and time.  Psychiatric:        Mood and Affect: Mood normal.        Behavior: Behavior normal.    Breast Exam Chaperone: Thana Ates     LABORATORY DATA:  I have reviewed the data as listed CMP Latest Ref Rng & Units 02/06/2021 03/01/2020 10/18/2019  Glucose 70 - 99 mg/dL 146(H) 142(H) 98  BUN 8 - 23 mg/dL 14 23 33(H)  Creatinine 0.44 - 1.00 mg/dL 0.91 1.27(H) 1.34(H)  Sodium 135 - 145 mmol/L 142 137 141  Potassium 3.5 - 5.1 mmol/L 3.7 3.8 4.0  Chloride 98 - 111 mmol/L 106 100 103  CO2 22 - 32 mmol/L _0 Calcium 8.9 - 10.3 mg/dL 8.2(L) 9.4 9.6  Total Protein 6.5 - 8.1 g/dL 7.0 7.0 -  Total Bilirubin 0.3 - 1.2 mg/dL 0.8 0.6 -  Alkaline Phos 38 - 126 U/L 98 109 -  AST 15 - 41 U/L 16 14(L) -  ALT 0 - 44 U/L  12 10 -   No results found for: [id number] Lab Results  Component Value Date   WBC 7.8 02/06/2021   HGB 10.6 (L) 02/06/2021   HCT 34.2 (L) 02/06/2021   MCV 97.4 02/06/2021   PLT 275 02/06/2021   NEUTROABS 5.9 02/06/2021    ASSESSMENT:  1.  Stage I (PT 1 BP N0) right breast lobular carcinoma: -Status post lumpectomy on 09/20/2014, 0.6 cm, ER/PR positive, HER-2 negative by FISH, Ki-67 of 26%, Arimidex started on 09/27/2014.  For some reason she did not receive radiation therapy. -She is tolerating anastrozole very well without any major problems. -Mammogram on 08/24/2018 was BI-RADS Category 2. -Mammogram on 08/30/2019 was BI-RADS Category 2.   2.  Normocytic anemia: -This is from combination of CKD and iron deficiency state. -She reportedly received 4 units of PRBC in March at [facility].  She had colonoscopy on 07/14/2017 at [facility] which was normal. - A capsule study on 08/26/2017 by Dr. Laural Golden showed few small bowel punctate AVM with speck of blood, no active bleeding. -Last Feraheme on 09/14/2019 and 09/21/2019.   3.  Osteopenia: - DEXA scan on 08/13/2015 shows T score of -1.7 in the left femoral neck.   - DEXA scan on 04/15/2018 shows T score of -1.7 and stable.   PLAN:  1.  Stage I (PT 1 BP N0) right breast lobular carcinoma: - Physical examination today did not reveal any palpable masses.  Right breast upper outer quadrant lumpectomy scar is within normal limits.  No palpable adenopathy. - Labs from 02/06/2021 did not show any liver function abnormalities. - Mammogram on 11/12/2020 was BI-RADS Category 1. - RTC 6 months for follow-up.   2.  Normocytic anemia: -  Hemoglobin has decreased to 10.6.  Ferritin is 231 and percent saturation is low at 8. - She has some tiredness.  She was also started back on Eliquis 2 months back coinciding with the decreasing hemoglobin. - Recommend Feraheme weekly x2. - RTC 6 months with repeat labs.   3.  Osteopenia: - Vitamin D is normal  at 43.  Continue vitamin D weekly.  Breast Cancer therapy associated bone loss: I have recommended calcium, Vitamin D and weight bearing exercises.  Orders placed this encounter:  No orders of the defined types were placed in this encounter.   The patient has a good understanding of the overall plan. She agrees with it. She will call with any problems that may develop before the next visit here.  Derek Jack, MD [place] [phone]   I, Thana Ates, am acting as a scribe for Dr. Derek Jack.  I, Derek Jack MD, have reviewed the above documentation for accuracy and completeness, and I agree with the above.

## 2021-02-14 ENCOUNTER — Inpatient Hospital Stay (HOSPITAL_BASED_OUTPATIENT_CLINIC_OR_DEPARTMENT_OTHER): Payer: Medicare Other | Admitting: Hematology

## 2021-02-14 ENCOUNTER — Other Ambulatory Visit: Payer: Self-pay

## 2021-02-14 VITALS — BP 169/81 | HR 66 | Temp 98.3°F | Resp 18 | Wt 176.0 lb

## 2021-02-14 DIAGNOSIS — N189 Chronic kidney disease, unspecified: Secondary | ICD-10-CM | POA: Diagnosis not present

## 2021-02-14 DIAGNOSIS — C50911 Malignant neoplasm of unspecified site of right female breast: Secondary | ICD-10-CM | POA: Diagnosis not present

## 2021-02-14 DIAGNOSIS — M8580 Other specified disorders of bone density and structure, unspecified site: Secondary | ICD-10-CM | POA: Diagnosis not present

## 2021-02-14 DIAGNOSIS — D509 Iron deficiency anemia, unspecified: Secondary | ICD-10-CM | POA: Diagnosis not present

## 2021-02-14 DIAGNOSIS — Z170 Estrogen receptor positive status [ER+]: Secondary | ICD-10-CM | POA: Diagnosis not present

## 2021-02-14 DIAGNOSIS — D631 Anemia in chronic kidney disease: Secondary | ICD-10-CM | POA: Diagnosis not present

## 2021-02-14 NOTE — Patient Instructions (Signed)
Alicia Nash at [facility] Discharge Instructions  You were seen and examined today by Dr. Delton Coombes. We will get you scheduled for iron infusion. Please follow up as scheduled.   Thank you for choosing [facility] at [facility] to provide your oncology and hematology care.  To afford each patient quality time with our provider, please arrive at least 15 minutes before your scheduled appointment time.   If you have a lab appointment with the [facility] please come in thru the Main Entrance and check in at the main information desk.  You need to re-schedule your appointment should you arrive 10 or more minutes late.  We strive to give you quality time with our providers, and arriving late affects you and other patients whose appointments are after yours.  Also, if you no show three or more times for appointments you may be dismissed from the clinic at the providers discretion.     Again, thank you for choosing [facility].  Our hope is that these requests will decrease the amount of time that you wait before being seen by our physicians.       _____________________________________________________________  Should you have questions after your visit to [facility] please contact our office at [phone] and follow the prompts.  Our office hours are 8:00 a.m. and 4:30 p.m. Monday - Friday.  Please note that voicemails left after 4:00 p.m. may not be returned until the following business day.  We are closed weekends and major holidays.  You do have access to a nurse 24-7, just call the main number to the clinic [phone] and do not press any options, hold on the line and a nurse will answer the phone.    For prescription refill requests, have your pharmacy contact our office and allow 72 hours.    Due to Covid, you will need to wear a mask upon entering the hospital. If you do not have a mask, a mask will be given to you  at the Main Entrance upon arrival. For doctor visits, patients may have 1 support person age 52 or older with them. For treatment visits, patients can not have anyone with them due to social distancing guidelines and our immunocompromised population.

## 2021-02-15 ENCOUNTER — Encounter (HOSPITAL_COMMUNITY): Payer: Self-pay | Admitting: Hematology

## 2021-02-22 ENCOUNTER — Inpatient Hospital Stay (HOSPITAL_COMMUNITY): Payer: Medicare Other

## 2021-02-22 ENCOUNTER — Ambulatory Visit (HOSPITAL_COMMUNITY): Payer: Medicare Other

## 2021-02-22 ENCOUNTER — Other Ambulatory Visit: Payer: Self-pay

## 2021-02-22 VITALS — BP 143/84 | HR 60 | Temp 97.8°F | Resp 18

## 2021-02-22 DIAGNOSIS — M8580 Other specified disorders of bone density and structure, unspecified site: Secondary | ICD-10-CM | POA: Diagnosis not present

## 2021-02-22 DIAGNOSIS — D509 Iron deficiency anemia, unspecified: Secondary | ICD-10-CM | POA: Diagnosis not present

## 2021-02-22 DIAGNOSIS — D631 Anemia in chronic kidney disease: Secondary | ICD-10-CM | POA: Diagnosis not present

## 2021-02-22 DIAGNOSIS — C50911 Malignant neoplasm of unspecified site of right female breast: Secondary | ICD-10-CM | POA: Diagnosis not present

## 2021-02-22 DIAGNOSIS — N189 Chronic kidney disease, unspecified: Secondary | ICD-10-CM | POA: Diagnosis not present

## 2021-02-22 DIAGNOSIS — D508 Other iron deficiency anemias: Secondary | ICD-10-CM

## 2021-02-22 DIAGNOSIS — Z170 Estrogen receptor positive status [ER+]: Secondary | ICD-10-CM | POA: Diagnosis not present

## 2021-02-22 MED ORDER — SODIUM CHLORIDE 0.9 % IV SOLN: INTRAVENOUS | Status: DC

## 2021-02-22 MED ORDER — SODIUM CHLORIDE 0.9 % IV SOLN
510.0000 mg | Freq: Once | INTRAVENOUS | Status: AC
  Administered 2021-02-22: 510 mg via INTRAVENOUS
  Filled 2021-02-22: qty 510

## 2021-02-22 NOTE — Patient Instructions (Signed)
Alicia Nash  Discharge Instructions: Thank you for choosing [facility] to provide your oncology and hematology care.  If you have a lab appointment with the [facility], please come in thru the Main Entrance and check in at the main information desk.  Wear comfortable clothing and clothing appropriate for easy access to any Portacath or PICC line.   We strive to give you quality time with your provider. You may need to reschedule your appointment if you arrive late (15 or more minutes).  Arriving late affects you and other patients whose appointments are after yours.  Also, if you miss three or more appointments without notifying the office, you may be dismissed from the clinic at the provider's discretion.      For prescription refill requests, have your pharmacy contact our office and allow 72 hours for refills to be completed.    Today you received Feraheme IV iron infusion.     BELOW ARE SYMPTOMS THAT SHOULD BE REPORTED IMMEDIATELY: *FEVER GREATER THAN 100.4 F (38 C) OR HIGHER *CHILLS OR SWEATING *NAUSEA AND VOMITING THAT IS NOT CONTROLLED WITH YOUR NAUSEA MEDICATION *UNUSUAL SHORTNESS OF BREATH *UNUSUAL BRUISING OR BLEEDING *URINARY PROBLEMS (pain or burning when urinating, or frequent urination) *BOWEL PROBLEMS (unusual diarrhea, constipation, pain near the anus) TENDERNESS IN MOUTH AND THROAT WITH OR WITHOUT PRESENCE OF ULCERS (sore throat, sores in mouth, or a toothache) UNUSUAL RASH, SWELLING OR PAIN  UNUSUAL VAGINAL DISCHARGE OR ITCHING   Items with * indicate a potential emergency and should be followed up as soon as possible or go to the Emergency Department if any problems should occur.  Please show the CHEMOTHERAPY ALERT CARD or IMMUNOTHERAPY ALERT CARD at check-in to the Emergency Department and triage nurse.  Should you have questions after your visit or need to cancel or reschedule your appointment, please contact [facility]  [phone]  and follow the prompts.  Office hours are 8:00 a.m. to 4:30 p.m. Monday - Friday. Please note that voicemails left after 4:00 p.m. may not be returned until the following business day.  We are closed weekends and major holidays. You have access to a nurse at all times for urgent questions. Please call the main number to the clinic [phone] and follow the prompts.  For any non-urgent questions, you may also contact your provider using MyChart. We now offer e-Visits for anyone 49 and older to request care online for non-urgent symptoms. For details visit [email or web address].   Also download the MyChart app! Go to the app store, search "MyChart", open the app, select [facility], and log in with your MyChart username and password.  Due to Covid, a mask is required upon entering the hospital/clinic. If you do not have a mask, one will be given to you upon arrival. For doctor visits, patients may have 1 support person aged 62 or older with them. For treatment visits, patients cannot have anyone with them due to current Covid guidelines and our immunocompromised population.

## 2021-02-22 NOTE — Progress Notes (Signed)
Pt presents today for Feraheme IV iron infusion per provider's order. Vital signs stable and pt voiced no new complaints at this time.  Peripheral IV started with good blood return pre and post infusion.  Feraheme given today per MD orders. Tolerated infusion without adverse affects. Vital signs stable. No complaints at this time. Discharged from clinic ambulatory in stable condition. Alert and oriented x 3. F/U with [facility] as scheduled.

## 2021-02-25 DIAGNOSIS — K2100 Gastro-esophageal reflux disease with esophagitis, without bleeding: Secondary | ICD-10-CM | POA: Diagnosis not present

## 2021-02-25 DIAGNOSIS — I10 Essential (primary) hypertension: Secondary | ICD-10-CM | POA: Diagnosis not present

## 2021-02-25 DIAGNOSIS — M25769 Osteophyte, unspecified knee: Secondary | ICD-10-CM | POA: Diagnosis not present

## 2021-03-01 ENCOUNTER — Other Ambulatory Visit: Payer: Self-pay

## 2021-03-01 ENCOUNTER — Inpatient Hospital Stay (HOSPITAL_COMMUNITY): Payer: Medicare Other

## 2021-03-01 ENCOUNTER — Ambulatory Visit (HOSPITAL_COMMUNITY): Payer: Medicare Other

## 2021-03-01 VITALS — BP 156/63 | HR 59 | Temp 96.9°F | Resp 18

## 2021-03-01 DIAGNOSIS — D509 Iron deficiency anemia, unspecified: Secondary | ICD-10-CM | POA: Diagnosis not present

## 2021-03-01 DIAGNOSIS — D508 Other iron deficiency anemias: Secondary | ICD-10-CM

## 2021-03-01 DIAGNOSIS — Z170 Estrogen receptor positive status [ER+]: Secondary | ICD-10-CM | POA: Diagnosis not present

## 2021-03-01 DIAGNOSIS — M8580 Other specified disorders of bone density and structure, unspecified site: Secondary | ICD-10-CM | POA: Diagnosis not present

## 2021-03-01 DIAGNOSIS — N189 Chronic kidney disease, unspecified: Secondary | ICD-10-CM | POA: Diagnosis not present

## 2021-03-01 DIAGNOSIS — C50911 Malignant neoplasm of unspecified site of right female breast: Secondary | ICD-10-CM | POA: Diagnosis not present

## 2021-03-01 DIAGNOSIS — D631 Anemia in chronic kidney disease: Secondary | ICD-10-CM | POA: Diagnosis not present

## 2021-03-01 MED ORDER — SODIUM CHLORIDE 0.9 % IV SOLN: Freq: Once | INTRAVENOUS | Status: AC

## 2021-03-01 MED ORDER — SODIUM CHLORIDE 0.9 % IV SOLN
510.0000 mg | Freq: Once | INTRAVENOUS | Status: AC
  Administered 2021-03-01: 510 mg via INTRAVENOUS
  Filled 2021-03-01: qty 510

## 2021-03-01 NOTE — Progress Notes (Signed)
Patient presents today for iron infusion.  Patient is in satisfactory condition with no new complaints voiced.  Vital signs are within parameters.  We will proceed with treatment per MD orders.   Patient tolerated treatment well with no complaints voiced.  Patient left ambulatory in stable condition.  Vital signs stable at discharge.  Follow up as scheduled.

## 2021-03-01 NOTE — Patient Instructions (Signed)
Anza  Discharge Instructions: Thank you for choosing [facility] to provide your oncology and hematology care.  If you have a lab appointment with the [facility], please come in thru the Main Entrance and check in at the main information desk.  Wear comfortable clothing and clothing appropriate for easy access to any Portacath or PICC line.   We strive to give you quality time with your provider. You may need to reschedule your appointment if you arrive late (15 or more minutes).  Arriving late affects you and other patients whose appointments are after yours.  Also, if you miss three or more appointments without notifying the office, you may be dismissed from the clinic at the provider's discretion.      For prescription refill requests, have your pharmacy contact our office and allow 72 hours for refills to be completed.     To help prevent nausea and vomiting after your treatment, we encourage you to take your nausea medication as directed.  BELOW ARE SYMPTOMS THAT SHOULD BE REPORTED IMMEDIATELY: *FEVER GREATER THAN 100.4 F (38 C) OR HIGHER *CHILLS OR SWEATING *NAUSEA AND VOMITING THAT IS NOT CONTROLLED WITH YOUR NAUSEA MEDICATION *UNUSUAL SHORTNESS OF BREATH *UNUSUAL BRUISING OR BLEEDING *URINARY PROBLEMS (pain or burning when urinating, or frequent urination) *BOWEL PROBLEMS (unusual diarrhea, constipation, pain near the anus) TENDERNESS IN MOUTH AND THROAT WITH OR WITHOUT PRESENCE OF ULCERS (sore throat, sores in mouth, or a toothache) UNUSUAL RASH, SWELLING OR PAIN  UNUSUAL VAGINAL DISCHARGE OR ITCHING   Items with * indicate a potential emergency and should be followed up as soon as possible or go to the Emergency Department if any problems should occur.  Please show the CHEMOTHERAPY ALERT CARD or IMMUNOTHERAPY ALERT CARD at check-in to the Emergency Department and triage nurse.  Should you have questions after your visit or need to cancel or  reschedule your appointment, please contact [facility] [phone]  and follow the prompts.  Office hours are 8:00 a.m. to 4:30 p.m. Monday - Friday. Please note that voicemails left after 4:00 p.m. may not be returned until the following business day.  We are closed weekends and major holidays. You have access to a nurse at all times for urgent questions. Please call the main number to the clinic [phone] and follow the prompts.  For any non-urgent questions, you may also contact your provider using MyChart. We now offer e-Visits for anyone 70 and older to request care online for non-urgent symptoms. For details visit [email or web address].   Also download the MyChart app! Go to the app store, search "MyChart", open the app, select [facility], and log in with your MyChart username and password.  Due to Covid, a mask is required upon entering the hospital/clinic. If you do not have a mask, one will be given to you upon arrival. For doctor visits, patients may have 1 support person aged 45 or older with them. For treatment visits, patients cannot have anyone with them due to current Covid guidelines and our immunocompromised population.

## 2021-03-04 ENCOUNTER — Institutional Professional Consult (permissible substitution): Payer: Medicare Other | Admitting: Internal Medicine

## 2021-03-08 ENCOUNTER — Ambulatory Visit (INDEPENDENT_AMBULATORY_CARE_PROVIDER_SITE_OTHER): Payer: Medicare Other | Admitting: Internal Medicine

## 2021-03-08 ENCOUNTER — Ambulatory Visit (HOSPITAL_COMMUNITY): Payer: Medicare Other

## 2021-03-08 VITALS — BP 138/70 | HR 60 | Ht 63.0 in | Wt 175.0 lb

## 2021-03-08 DIAGNOSIS — I10 Essential (primary) hypertension: Secondary | ICD-10-CM | POA: Diagnosis not present

## 2021-03-08 DIAGNOSIS — I442 Atrioventricular block, complete: Secondary | ICD-10-CM | POA: Diagnosis not present

## 2021-03-08 DIAGNOSIS — I480 Paroxysmal atrial fibrillation: Secondary | ICD-10-CM

## 2021-03-08 DIAGNOSIS — D6869 Other thrombophilia: Secondary | ICD-10-CM | POA: Diagnosis not present

## 2021-03-08 LAB — CUP PACEART INCLINIC DEVICE CHECK
Battery Impedance: 2707 Ohm
Battery Remaining Longevity: 20 mo
Battery Voltage: 2.72 V
Brady Statistic AP VP Percent: 44 %
Brady Statistic AP VS Percent: 0 %
Brady Statistic AS VP Percent: 56 %
Brady Statistic AS VS Percent: 0 %
Date Time Interrogation Session: 20221104121158
Implantable Lead Implant Date: 20150108
Implantable Lead Implant Date: 20150108
Implantable Lead Location: 753859
Implantable Lead Location: 753860
Implantable Lead Model: 5076
Implantable Lead Model: 5076
Implantable Pulse Generator Implant Date: 20150108
Lead Channel Impedance Value: 406 Ohm
Lead Channel Impedance Value: 429 Ohm
Lead Channel Pacing Threshold Amplitude: 0.5 V
Lead Channel Pacing Threshold Amplitude: 0.75 V
Lead Channel Pacing Threshold Pulse Width: 0.4 ms
Lead Channel Pacing Threshold Pulse Width: 0.4 ms
Lead Channel Sensing Intrinsic Amplitude: 0.5 mV
Lead Channel Setting Pacing Amplitude: 2 V
Lead Channel Setting Pacing Amplitude: 2.5 V
Lead Channel Setting Pacing Pulse Width: 0.4 ms
Lead Channel Setting Sensing Sensitivity: 2 mV

## 2021-03-08 NOTE — Patient Instructions (Addendum)
Medication Instructions:  Continue all current medications.  Labwork: none  Testing/Procedures: none  Follow-Up: 1 year - Dr.  Rayann Heman   Any Other Special Instructions Will Be Listed Below (If Applicable).   If you need a refill on your cardiac medications before your next appointment, please call your pharmacy.

## 2021-03-08 NOTE — Progress Notes (Signed)
PCP: [name], MD Primary Cardiologist: Dr Domenic Polite Primary EP:  Dr Franky Macho is a 80 y.o. female who presents today for routine electrophysiology followup.  Since last being seen in our clinic, the patient reports doing reasonably well.  She recently had worsening SOB and edema and was diagnosed with CHF by Dr Sherrie Sport.  She has improved since that time.  She has rare afib.  Today, she denies symptoms of palpitations, chest pain,  dizziness, presyncope, or syncope.  The patient is otherwise without complaint today.   Past Medical History:  Diagnosis Date   Arthritis    Breast cancer ([place])    Cellulitis    Right leg   CHF (congestive heart failure) ([place])    Hyperlipidemia    Hypertension    Lobular carcinoma of right breast ([place]) 09/27/2014   Malignant neoplasm of upper-outer quadrant of right female breast ([place]) 09/27/2014   Paroxysmal atrial fibrillation ([facility])    Varicose veins    venous stasis skin changes   Past Surgical History:  Procedure Laterality Date   ABDOMINAL HYSTERECTOMY     CHOLECYSTECTOMY     ESOPHAGOGASTRODUODENOSCOPY N/A 01/13/2018   Procedure: ESOPHAGOGASTRODUODENOSCOPY (EGD);  Surgeon: Rogene Houston, MD;  [facility];  Service: Endoscopy;  Laterality: N/A;   FOOT SURGERY     Multiple foot surgeries by Dr. Iona Coach CAPSULE STUDY N/A 08/26/2017   Procedure: GIVENS CAPSULE STUDY;  Surgeon: Rogene Houston, MD;  Location: [facility];  Service: Endoscopy;  Laterality: N/A;  8:30   JOINT REPLACEMENT     bilateral knee by Drs. McGee and McGinley   PACEMAKER IMPLANT Left 05/12/2013   MDT Sherril Croon PPM implanted by Dr Dot Lanes for CHB and SSS   SHOULDER SURGERY      ROS- all systems are reviewed and negative except as per HPI above  Current Outpatient Medications  Medication Sig Dispense Refill   acetaminophen (TYLENOL) 500 MG tablet Take 500 mg by mouth daily as needed for mild pain or moderate pain.      albuterol  (VENTOLIN HFA) 108 (90 Base) MCG/ACT inhaler      anastrozole (ARIMIDEX) 1 MG tablet TAKE ONE TABLET BY MOUTH DAILY 30 tablet 5   apixaban (ELIQUIS) 2.5 MG TABS tablet Take 1 tablet (2.5 mg total) by mouth 2 (two) times daily. 60 tablet 1   benazepril (LOTENSIN) 40 MG tablet Take 40 mg by mouth daily.     carvedilol (COREG) 6.25 MG tablet      cetirizine (ZYRTEC) 10 MG tablet Take 10 mg by mouth daily as needed for allergies.  0   ciprofloxacin (CIPRO) 500 MG tablet Take 500 mg by mouth 2 (two) times daily.     citalopram (CELEXA) 20 MG tablet Take 20 mg by mouth every morning.      Colchicine 0.6 MG CAPS Take 1 capsule by mouth 2 (two) times daily.     Cranberry 400 MG CAPS Take 1 tablet by mouth every morning.      diazepam (VALIUM) 5 MG tablet Take 1 tablet 2 (two) times daily by mouth.     docusate sodium (COLACE) 100 MG capsule Take 100 mg by mouth every morning.     furosemide (LASIX) 40 MG tablet Take 1 tablet by mouth every morning.      gemfibrozil (LOPID) 600 MG tablet Take 1 tablet by mouth every morning.      hydrALAZINE (APRESOLINE) 25 MG tablet  Take 25 mg by mouth 2 (two) times daily.     Multiple Vitamin (MULTIVITAMIN) tablet Take 1 tablet by mouth every morning.      olmesartan (BENICAR) 40 MG tablet Take 1 tablet (40 mg total) by mouth daily. 30 tablet 11   pantoprazole (PROTONIX) 40 MG tablet Take by mouth.     PNEUMOVAX 23 25 MCG/0.5ML injection      Promethazine HCl 6.25 MG/5ML SOLN Take 5 mLs by mouth every 6 (six) hours as needed.     rosuvastatin (CRESTOR) 10 MG tablet Take 10 mg by mouth daily.     silver sulfADIAZINE (SILVADENE) 1 % cream Apply topically See admin instructions.     sodium bicarbonate 650 MG tablet Take by mouth.     traMADol (ULTRAM) 50 MG tablet Take 50 mg by mouth every 6 (six) hours as needed.     triamcinolone cream (KENALOG) 0.1 % APPLY A THIN LAYER TO THE AFFECTED AREAS DAILY     ULORIC 40 MG tablet Take 1 tablet by mouth every morning.       verapamil (VERELAN PM) 360 MG 24 hr capsule Take 360 mg by mouth every morning.      vitamin C (ASCORBIC ACID) 500 MG tablet Take 500 mg by mouth every morning.      Vitamin D, Ergocalciferol, (DRISDOL) 1.25 MG (50000 UNIT) CAPS capsule TAKE ONE CAPSULE BY MOUTH ONCE A WEEK. 12 capsule 2   No current facility-administered medications for this visit.   Facility-Administered Medications Ordered in Other Visits  Medication Dose Route Frequency Provider Last Rate Last Admin   0.9 %  sodium chloride infusion   Intravenous Continuous Derek Jack, MD 20 mL/hr at 05/07/18 1447 New Bag at 05/07/18 1447    Physical Exam: Vitals:   03/08/21 1200  BP: 138/70  Pulse: 60  SpO2: 98%  Weight: 175 lb (79.4 kg)  Height: _0  (1.6 m)    GEN- The patient is well appearing, alert and oriented x 3 today.   Head- normocephalic, atraumatic Eyes-  Sclera clear, conjunctiva pink Ears- hearing intact Oropharynx- clear Lungs- Clear to ausculation bilaterally, normal work of breathing Chest- pacemaker pocket is well healed Heart- Regular rate and rhythm, no murmurs, rubs or gallops, PMI not laterally displaced GI- soft, NT, ND, + BS Extremities- no clubbing, cyanosis, or edema  Pacemaker interrogation- reviewed in detail today,  See PACEART report  ekg tracing ordered today is personally reviewed and shows AV paced  Echo 01/01/21 shows EF 60%, pulmonary htn, moderate to severe LA enlargement, grade II diastolic dysfunction  Assessment and Plan:  1. Symptomatic sinus bradycardia and complete heart block Normal pacemaker function See Pace Art report No changes today she is device dependant today  2. Paroxymal atrial fibrillation  Burden 2% (previously <1%) Continue eliquis  3. HTN Stable No change required today  4. Recently diagnosed diastolic dysfunction Echo 8/22 reviewed Will need additional evaluation of pulmonary htn on follow-up with Dr Domenic Polite later this month  Return in  a year  Thompson Grayer MD, [facility] 03/08/2021 12:11 PM

## 2021-03-26 DIAGNOSIS — H81399 Other peripheral vertigo, unspecified ear: Secondary | ICD-10-CM | POA: Diagnosis not present

## 2021-03-27 ENCOUNTER — Encounter: Payer: Self-pay | Admitting: Cardiology

## 2021-03-27 ENCOUNTER — Ambulatory Visit (INDEPENDENT_AMBULATORY_CARE_PROVIDER_SITE_OTHER): Payer: Medicare Other | Admitting: Cardiology

## 2021-03-27 VITALS — BP 110/54 | HR 65 | Ht 63.0 in | Wt 174.0 lb

## 2021-03-27 DIAGNOSIS — I480 Paroxysmal atrial fibrillation: Secondary | ICD-10-CM | POA: Diagnosis not present

## 2021-03-27 DIAGNOSIS — I5032 Chronic diastolic (congestive) heart failure: Secondary | ICD-10-CM

## 2021-03-27 DIAGNOSIS — I272 Pulmonary hypertension, unspecified: Secondary | ICD-10-CM

## 2021-03-27 NOTE — Progress Notes (Signed)
Cardiology Office Note  Date: 03/27/2021   ID: Rolla, Servidio [birth year], MRN [id number]  PCP:  [name], MD  Cardiologist:  Rozann Lesches, MD Electrophysiologist:  Thompson Grayer, MD   Chief Complaint  Patient presents with   Cardiac follow-up     History of Present Illness: Alicia Nash is an 80 y.o. female former patient of Dr. Bronson Ing now presenting to establish follow-up with me.  I reviewed her records and updated the chart.  She has been seen most recently by Dr. Rayann Heman.  She has a [employer] pacemaker in place with history of complete heart block, followed by Dr. Rayann Heman.  Most recent device interrogation indicated 2.6% AF burden, longest episode lasting about 28 hours.  She had an echocardiogram done through [facility] back in August that demonstrated LVEF 60 to 65% with moderate diastolic dysfunction, moderate mitral regurgitation, moderately to severely dilated left atrium, and normal RV contraction with mild to moderate tricuspid regurgitation and evidence of severe pulmonary hypertension with RVSP estimated 66 mmHg.  Chest CT also done through [facility] in September indicated a 4.1 cm ascending thoracic aortic aneurysm, also a 6 mm left lower lobe pulmonary nodule.  Pulmonary arteries are enlarged consistent with pulmonary hypertension as well.  She is here today for a routine visit, remains functional with ADLs and reports NYHA class II dyspnea with most activities, sometimes NYHA class III if she is doing yard work.  She does not describe any cough, states that this improved after being taken off Lotensin with switch to olmesartan by Dr. Melvyn Novas.  She is consistent with diuretic use, reports stable weight and no progressive leg swelling.   Past Medical History:  Diagnosis Date   Arthritis    Cellulitis    Right leg   Hyperlipidemia    Hypertension    Lobular carcinoma of right breast ([place]) 09/27/2014   Malignant neoplasm of upper-outer  quadrant of right female breast ([place]) 09/27/2014   Paroxysmal atrial fibrillation ([facility])    Pulmonary hypertension ([place])    Thoracic ascending aortic aneurysm    Varicose veins     Past Surgical History:  Procedure Laterality Date   ABDOMINAL HYSTERECTOMY     CHOLECYSTECTOMY     ESOPHAGOGASTRODUODENOSCOPY N/A 01/13/2018   Procedure: ESOPHAGOGASTRODUODENOSCOPY (EGD);  Surgeon: Rogene Houston, MD;  [facility];  Service: Endoscopy;  Laterality: N/A;   FOOT SURGERY     Multiple foot surgeries by Dr. Iona Coach CAPSULE STUDY N/A 08/26/2017   Procedure: GIVENS CAPSULE STUDY;  Surgeon: Rogene Houston, MD;  Location[facility];  Service: Endoscopy;  Laterality: N/A;  8:30   JOINT REPLACEMENT     bilateral knee by Drs. Sallee Provencal and McGinley   PACEMAKER IMPLANT Left 05/12/2013   MDT Sherril Croon PPM implanted by Dr Dot Lanes for CHB and SSS   SHOULDER SURGERY      Current Outpatient Medications  Medication Sig Dispense Refill   acetaminophen (TYLENOL) 500 MG tablet Take 500 mg by mouth daily as needed for mild pain or moderate pain.      albuterol (VENTOLIN HFA) 108 (90 Base) MCG/ACT inhaler      anastrozole (ARIMIDEX) 1 MG tablet TAKE ONE TABLET BY MOUTH DAILY 30 tablet 5   apixaban (ELIQUIS) 2.5 MG TABS tablet Take 1 tablet (2.5 mg total) by mouth 2 (two) times daily. 60 tablet 1   carvedilol (COREG) 6.25 MG tablet      cetirizine (ZYRTEC) 10  MG tablet Take 10 mg by mouth daily as needed for allergies.  0   ciprofloxacin (CIPRO) 500 MG tablet Take 500 mg by mouth 2 (two) times daily.     citalopram (CELEXA) 20 MG tablet Take 20 mg by mouth every morning.      Colchicine 0.6 MG CAPS Take 1 capsule by mouth 2 (two) times daily.     Cranberry 400 MG CAPS Take 1 tablet by mouth every morning.      diazepam (VALIUM) 5 MG tablet Take 1 tablet 2 (two) times daily by mouth.     docusate sodium (COLACE) 100 MG capsule Take 100 mg by mouth every morning.     furosemide (LASIX) 40 MG tablet  Take 1 tablet by mouth every morning.      gemfibrozil (LOPID) 600 MG tablet Take 1 tablet by mouth every morning.      hydrALAZINE (APRESOLINE) 25 MG tablet Take 25 mg by mouth 2 (two) times daily.     Multiple Vitamin (MULTIVITAMIN) tablet Take 1 tablet by mouth every morning.      olmesartan (BENICAR) 40 MG tablet Take 1 tablet (40 mg total) by mouth daily. 30 tablet 11   pantoprazole (PROTONIX) 40 MG tablet Take by mouth.     PNEUMOVAX 23 25 MCG/0.5ML injection      Promethazine HCl 6.25 MG/5ML SOLN Take 5 mLs by mouth every 6 (six) hours as needed.     rosuvastatin (CRESTOR) 10 MG tablet Take 10 mg by mouth daily.     silver sulfADIAZINE (SILVADENE) 1 % cream Apply topically See admin instructions.     sodium bicarbonate 650 MG tablet Take by mouth.     traMADol (ULTRAM) 50 MG tablet Take 50 mg by mouth every 6 (six) hours as needed.     triamcinolone cream (KENALOG) 0.1 % APPLY A THIN LAYER TO THE AFFECTED AREAS DAILY     ULORIC 40 MG tablet Take 1 tablet by mouth every morning.      verapamil (VERELAN PM) 360 MG 24 hr capsule Take 360 mg by mouth every morning.      vitamin C (ASCORBIC ACID) 500 MG tablet Take 500 mg by mouth every morning.      Vitamin D, Ergocalciferol, (DRISDOL) 1.25 MG (50000 UNIT) CAPS capsule TAKE ONE CAPSULE BY MOUTH ONCE A WEEK. 12 capsule 2   No current facility-administered medications for this visit.   Facility-Administered Medications Ordered in Other Visits  Medication Dose Route Frequency Provider Last Rate Last Admin   0.9 %  sodium chloride infusion   Intravenous Continuous Derek Jack, MD 20 mL/hr at 05/07/18 1447 New Bag at 05/07/18 1447   Allergies:  Dihydrocodeine and Other   ROS: No orthopnea or PND.  Physical Exam: VS:  BP (!) 110/54   Pulse 65   Ht _0  (1.6 m)   Wt 174 lb (78.9 kg)   SpO2 97%   BMI 30.82 kg/m , BMI Body mass index is 30.82 kg/m.  Wt Readings from Last 3 Encounters:  03/27/21 174 lb (78.9 kg)  03/08/21  175 lb (79.4 kg)  02/14/21 176 lb (79.8 kg)    General: Patient appears comfortable at rest. HEENT: Conjunctiva and lids normal, wearing a mask. Neck: Supple, no elevated JVP or carotid bruits, no thyromegaly. Lungs: Decreased breath sounds right base, nonlabored breathing at rest. Cardiac: Regular rate and rhythm, no S3, 1/6 systolic murmur, no pericardial rub. Abdomen: Soft, bowel sounds present. Extremities: No pitting edema.  ECG:  An ECG dated 03/08/2021 was personally reviewed today and demonstrated:  Dual chamber pacing.  Recent Labwork: 02/06/2021: ALT 12; AST 16; BUN 14; Creatinine, Ser 0.91; Hemoglobin 10.6; Platelets 275; Potassium 3.7; Sodium 142   Other Studies Reviewed Today:  Echocardiogram 01/01/2021 [facility]): Summary    1. The left ventricle is normal in size with normal wall thickness.    2. The left ventricular systolic function is normal, LVEF is visually  estimated at 60-65%.    3. There is grade II diastolic dysfunction (elevated filling pressure).    4. There is moderate mitral valve regurgitation.    5. There is mild aortic regurgitation.    6. The left atrium is moderately to severely dilated in size.    7. The right ventricle is normal in size, with normal systolic function.    8. There is mild to moderate tricuspid regurgitation.    9. There is severe pulmonary hypertension.    10. The right atrium is moderately dilated  in size.   Chest CT 01/29/2021 [facility]): IMPRESSION:  4.1 cm ascending thoracic aortic aneurysm. Recommend annual imaging  followup by CTA or MRA. This recommendation follows 2010  ACCF/AHA/AATS/ACR/ASA/SCA/SCAI/SIR/STS/SVM Guidelines for the  Diagnosis and Management of Patients with Thoracic Aortic Disease.  Circulation. 2010; 121: D326-Z124. Aortic aneurysm NOS  (ICD10-I71.9).   6 mm nodule is noted in left lower lobe. Non-contrast chest CT at  6-12 months is recommended. If the nodule is stable at time of  repeat  CT, then future CT at 18-24 months (from today's scan) is  considered optional for low-risk patients, but is recommended for  high-risk patients. This recommendation follows the consensus  statement: Guidelines for Management of Incidental Pulmonary Nodules  Detected on CT Images: From the Fleischner Society 2017; Radiology  2017; 284:228-243.   Mild enlargement of left and right pulmonary arteries is noted  suggesting pulmonary artery hypertension.   Aortic Atherosclerosis (ICD10-I70.0).   Assessment and Plan:  1.  HFpEF with chronic diastolic heart failure, LVEF 60 to 65% and moderate diastolic dysfunction by assessment in August.  She has associated pulmonary hypertension which I suspect is WHO group 2 in the setting of diastolic heart failure and also moderate mitral regurgitation.  She is symptomatically stable largely with NYHA class II symptoms and remains on medical therapy including Benicar, Coreg, hydralazine, verapamil, and Lasix.  No changes were made today.  We will plan a follow-up echocardiogram for her next visit.  2.  Asymptomatic 4.1 cm ascending thoracic aortic aneurysm documented by chest CT at [facility] in September.  Consider reimaging next September 2023.  3.  Essential hypertension, blood pressure is well controlled today.  She tolerated switch from Lotensin to olmesartan.  4.  Paroxysmal atrial fibrillation with relatively low rhythm burden by device interrogation.  CHA2DS2-VASc score is 5.  She is on Eliquis for stroke prophylaxis.  5.  History of complete heart block with [employer] pacemaker in place and followed by Dr. Rayann Heman.  Medication Adjustments/Labs and Tests Ordered: Current medicines are reviewed at length with the patient today.  Concerns regarding medicines are outlined above.   Tests Ordered: No orders of the defined types were placed in this encounter.   Medication Changes: No orders of the defined types were placed in this  encounter.   Disposition:  Follow up  6 months.  Signed, Satira Sark, MD, [facility] 03/27/2021 1:58 PM    [facility] [place]  [address], [place], [place] [address] Phone: [phone]; Fax: [phone]

## 2021-03-27 NOTE — Patient Instructions (Addendum)
Medication Instructions:  Your physician recommends that you continue on your current medications as directed. Please refer to the Current Medication list given to you today.  Labwork: none  Testing/Procedures: Your physician has requested that you have an echocardiogram in 6 months just before your next visit. Echocardiography is a painless test that uses sound waves to create images of your heart. It provides your doctor with information about the size and shape of your heart and how well your heart's chambers and valves are working. This procedure takes approximately one hour. There are no restrictions for this procedure.  Follow-Up: Your physician recommends that you schedule a follow-up appointment in: 6 months  Any Other Special Instructions Will Be Listed Below (If Applicable).  If you need a refill on your cardiac medications before your next appointment, please call your pharmacy.

## 2021-04-03 ENCOUNTER — Ambulatory Visit: Payer: Medicare Other | Admitting: Internal Medicine

## 2021-04-08 DIAGNOSIS — J40 Bronchitis, not specified as acute or chronic: Secondary | ICD-10-CM | POA: Diagnosis not present

## 2021-04-22 DIAGNOSIS — J40 Bronchitis, not specified as acute or chronic: Secondary | ICD-10-CM | POA: Diagnosis not present

## 2021-05-07 DIAGNOSIS — J40 Bronchitis, not specified as acute or chronic: Secondary | ICD-10-CM | POA: Diagnosis not present

## 2021-05-07 DIAGNOSIS — M109 Gout, unspecified: Secondary | ICD-10-CM | POA: Diagnosis not present

## 2021-05-07 DIAGNOSIS — I10 Essential (primary) hypertension: Secondary | ICD-10-CM | POA: Diagnosis not present

## 2021-05-13 ENCOUNTER — Ambulatory Visit: Payer: Medicare Other | Admitting: Internal Medicine

## 2021-05-13 DIAGNOSIS — L500 Allergic urticaria: Secondary | ICD-10-CM | POA: Diagnosis not present

## 2021-05-13 NOTE — Progress Notes (Deleted)
Alicia Nash, female    DOB: 12-25-1940    MRN: [id number]   Brief patient profile:  48 yowf never smoker  referred to pulmonary clinic in [place]  01/22/2021 by Dr Zada Girt for ? Pulmonary hypertension/ chronic dry cough.   History of Present Illness  01/22/2021  Pulmonary/ 1st office eval/ Alicia Nash / [name]  Chief Complaint  Patient presents with   Consult    2 months ago experienced Sob briefly, went to ER, was referred by pcp. States she has not had anymore SOB since ER visit. Persistent dry cough. Hx of breast cancer, pulmonary htn, Hosp. admission 12/16/2020 pleural effusion   Baseline good activity tolerance limited more by knees then two days prior to covid dx was Dec 2021 cough and aching rx tylenol only and remained weak but no cough / sob, fell and broke L ankle Easter Sunday > cast only  remained fatigued limited ankle then more sob in December 16, 2020 ER [name] > dx was ? PAH > eliquis 2.5 mg bid and seemed better Dyspnea:  no problem  Cough:  nagging cough/ dry more daytime assoc with gen ant  burning in  upper chest neg resp to HB rx,   rec was to take trelegy but hasn't started it yet  Sleep: on a pillow flat bed x years  SABA use: none  Rec Stop benazapril (lotensin)  and start olmesartan 40 mg one daily and cough should gradually resolve  We will schedule venous dopplers of both legs and can be done in [place]  Call me with the name the test you have in [place] so I can look up the results    05/13/2021  f/u ov/[place] office/[facility] re: cough ? Acei  maint on ***  No chief complaint on file.   Dyspnea:  *** Cough: *** Sleeping: *** SABA use: *** 02: *** Covid status: *** Lung cancer screening: ***   No obvious day to day or daytime variability or assoc excess/ purulent sputum or mucus plugs or hemoptysis or cp or chest tightness, subjective wheeze or overt sinus or hb symptoms.   *** without nocturnal  or early am exacerbation  of respiratory  c/o's or  need for noct saba. Also denies any obvious fluctuation of symptoms with weather or environmental changes or other aggravating or alleviating factors except as outlined above   No unusual exposure hx or h/o childhood pna/ asthma or knowledge of premature birth.  Current Allergies, Complete Past Medical History, Past Surgical History, Family History, and Social History were reviewed in [employer] record.  ROS  The following are not active complaints unless bolded Hoarseness, sore throat, dysphagia, dental problems, itching, sneezing,  nasal congestion or discharge of excess mucus or purulent secretions, ear ache,   fever, chills, sweats, unintended wt loss or wt gain, classically pleuritic or exertional cp,  orthopnea pnd or arm/hand swelling  or leg swelling, presyncope, palpitations, abdominal pain, anorexia, nausea, vomiting, diarrhea  or change in bowel habits or change in bladder habits, change in stools or change in urine, dysuria, hematuria,  rash, arthralgias, visual complaints, headache, numbness, weakness or ataxia or problems with walking or coordination,  change in mood or  memory.        No outpatient medications have been marked as taking for the 05/13/21 encounter (Appointment) with Tanda Rockers, MD.               Past Medical History:  Diagnosis Date  Arthritis    Breast cancer ([place])    Cellulitis    Right leg   CHF (congestive heart failure) ([place])    Hyperlipidemia    Hypertension    Lobular carcinoma of right breast ([place]) 09/27/2014   Malignant neoplasm of upper-outer quadrant of right female breast ([place]) 09/27/2014   Paroxysmal atrial fibrillation (HCC)    Varicose veins    venous stasis skin changes       Objective:       Wt Readings from Last 3 Encounters:  03/27/21 174 lb (78.9 kg)  03/08/21 175 lb (79.4 kg)  02/14/21 176 lb (79.8 kg)      Vital signs reviewed  05/13/2021  - Note at rest 02 sats  ***% on ***   General  appearance:    ***          Assessment

## 2021-05-16 DIAGNOSIS — L500 Allergic urticaria: Secondary | ICD-10-CM | POA: Diagnosis not present

## 2021-06-03 DIAGNOSIS — E7849 Other hyperlipidemia: Secondary | ICD-10-CM | POA: Diagnosis not present

## 2021-06-03 DIAGNOSIS — Z0000 Encounter for general adult medical examination without abnormal findings: Secondary | ICD-10-CM | POA: Diagnosis not present

## 2021-06-03 DIAGNOSIS — I959 Hypotension, unspecified: Secondary | ICD-10-CM | POA: Diagnosis not present

## 2021-06-13 ENCOUNTER — Ambulatory Visit: Payer: Medicare Other

## 2021-06-17 ENCOUNTER — Ambulatory Visit (INDEPENDENT_AMBULATORY_CARE_PROVIDER_SITE_OTHER): Payer: Medicare Other

## 2021-06-17 DIAGNOSIS — I442 Atrioventricular block, complete: Secondary | ICD-10-CM | POA: Diagnosis not present

## 2021-06-18 LAB — CUP PACEART REMOTE DEVICE CHECK
Battery Impedance: 2982 Ohm
Battery Remaining Longevity: 18 mo
Battery Voltage: 2.72 V
Brady Statistic AP VP Percent: 39 %
Brady Statistic AP VS Percent: 0 %
Brady Statistic AS VP Percent: 61 %
Brady Statistic AS VS Percent: 0 %
Date Time Interrogation Session: 20230213164035
Implantable Lead Implant Date: 20150108
Implantable Lead Implant Date: 20150108
Implantable Lead Location: 753859
Implantable Lead Location: 753860
Implantable Lead Model: 5076
Implantable Lead Model: 5076
Implantable Pulse Generator Implant Date: 20150108
Lead Channel Impedance Value: 419 Ohm
Lead Channel Impedance Value: 459 Ohm
Lead Channel Pacing Threshold Amplitude: 0.5 V
Lead Channel Pacing Threshold Amplitude: 0.875 V
Lead Channel Pacing Threshold Pulse Width: 0.4 ms
Lead Channel Pacing Threshold Pulse Width: 0.4 ms
Lead Channel Setting Pacing Amplitude: 2 V
Lead Channel Setting Pacing Amplitude: 2.5 V
Lead Channel Setting Pacing Pulse Width: 0.4 ms
Lead Channel Setting Sensing Sensitivity: 2 mV

## 2021-06-19 ENCOUNTER — Other Ambulatory Visit (HOSPITAL_COMMUNITY): Payer: Self-pay | Admitting: *Deleted

## 2021-06-19 DIAGNOSIS — C50919 Malignant neoplasm of unspecified site of unspecified female breast: Secondary | ICD-10-CM

## 2021-06-19 LAB — CUP PACEART REMOTE DEVICE CHECK
Battery Impedance: 2982 Ohm
Battery Impedance: 2982 Ohm
Battery Remaining Longevity: 18 mo
Battery Remaining Longevity: 18 mo
Battery Voltage: 2.72 V
Battery Voltage: 2.72 V
Brady Statistic AP VP Percent: 39 %
Brady Statistic AP VP Percent: 39 %
Brady Statistic AP VS Percent: 0 %
Brady Statistic AP VS Percent: 0 %
Brady Statistic AS VP Percent: 61 %
Brady Statistic AS VP Percent: 61 %
Brady Statistic AS VS Percent: 0 %
Brady Statistic AS VS Percent: 0 %
Date Time Interrogation Session: 20230213164035
Date Time Interrogation Session: 20230213164035
Implantable Lead Implant Date: 20150108
Implantable Lead Implant Date: 20150108
Implantable Lead Implant Date: 20150108
Implantable Lead Implant Date: 20150108
Implantable Lead Location: 753859
Implantable Lead Location: 753860
Implantable Lead Location: 753859
Implantable Lead Location: 753860
Implantable Lead Model: 5076
Implantable Lead Model: 5076
Implantable Lead Model: 5076
Implantable Lead Model: 5076
Implantable Pulse Generator Implant Date: 20150108
Implantable Pulse Generator Implant Date: 20150108
Lead Channel Impedance Value: 419 Ohm
Lead Channel Impedance Value: 459 Ohm
Lead Channel Impedance Value: 419 Ohm
Lead Channel Impedance Value: 459 Ohm
Lead Channel Pacing Threshold Amplitude: 0.5 V
Lead Channel Pacing Threshold Amplitude: 0.875 V
Lead Channel Pacing Threshold Amplitude: 0.5 V
Lead Channel Pacing Threshold Amplitude: 0.875 V
Lead Channel Pacing Threshold Pulse Width: 0.4 ms
Lead Channel Pacing Threshold Pulse Width: 0.4 ms
Lead Channel Pacing Threshold Pulse Width: 0.4 ms
Lead Channel Pacing Threshold Pulse Width: 0.4 ms
Lead Channel Setting Pacing Amplitude: 2 V
Lead Channel Setting Pacing Amplitude: 2.5 V
Lead Channel Setting Pacing Amplitude: 2 V
Lead Channel Setting Pacing Amplitude: 2.5 V
Lead Channel Setting Pacing Pulse Width: 0.4 ms
Lead Channel Setting Pacing Pulse Width: 0.4 ms
Lead Channel Setting Sensing Sensitivity: 2 mV
Lead Channel Setting Sensing Sensitivity: 2 mV

## 2021-06-19 MED ORDER — ANASTROZOLE 1 MG PO TABS: 1.0000 mg | ORAL_TABLET | Freq: Every day | ORAL | 5 refills | Status: DC

## 2021-06-19 NOTE — Telephone Encounter (Signed)
Per OVN, patient is to continue on Anatrozole 1 mg tablet.  Script sent to [facility].

## 2021-06-21 NOTE — Progress Notes (Signed)
Remote pacemaker transmission.

## 2021-06-27 DIAGNOSIS — J309 Allergic rhinitis, unspecified: Secondary | ICD-10-CM | POA: Diagnosis not present

## 2021-07-03 ENCOUNTER — Other Ambulatory Visit: Payer: Self-pay | Admitting: *Deleted

## 2021-07-03 DIAGNOSIS — I5032 Chronic diastolic (congestive) heart failure: Secondary | ICD-10-CM

## 2021-08-08 ENCOUNTER — Inpatient Hospital Stay (HOSPITAL_COMMUNITY): Payer: Medicare Other | Attending: Hematology

## 2021-08-08 DIAGNOSIS — D649 Anemia, unspecified: Secondary | ICD-10-CM | POA: Diagnosis not present

## 2021-08-08 DIAGNOSIS — C50911 Malignant neoplasm of unspecified site of right female breast: Secondary | ICD-10-CM | POA: Insufficient documentation

## 2021-08-08 DIAGNOSIS — Z170 Estrogen receptor positive status [ER+]: Secondary | ICD-10-CM | POA: Insufficient documentation

## 2021-08-08 DIAGNOSIS — M8588 Other specified disorders of bone density and structure, other site: Secondary | ICD-10-CM | POA: Insufficient documentation

## 2021-08-08 LAB — CBC WITH DIFFERENTIAL/PLATELET
Abs Immature Granulocytes: 0.03 10*3/uL (ref 0.00–0.07)
Basophils Absolute: 0.1 10*3/uL (ref 0.0–0.1)
Basophils Relative: 1 %
Eosinophils Absolute: 0.3 10*3/uL (ref 0.0–0.5)
Eosinophils Relative: 4 %
HCT: 33.4 % — ABNORMAL LOW (ref 36.0–46.0)
Hemoglobin: 10.8 g/dL — ABNORMAL LOW (ref 12.0–15.0)
Immature Granulocytes: 1 %
Lymphocytes Relative: 19 %
Lymphs Abs: 1.2 10*3/uL (ref 0.7–4.0)
MCH: 32.2 pg (ref 26.0–34.0)
MCHC: 32.3 g/dL (ref 30.0–36.0)
MCV: 99.7 fL (ref 80.0–100.0)
Monocytes Absolute: 0.7 10*3/uL (ref 0.1–1.0)
Monocytes Relative: 11 %
Neutro Abs: 4 10*3/uL (ref 1.7–7.7)
Neutrophils Relative %: 64 %
Platelets: 217 10*3/uL (ref 150–400)
RBC: 3.35 MIL/uL — ABNORMAL LOW (ref 3.87–5.11)
RDW: 13.9 % (ref 11.5–15.5)
WBC: 6.2 10*3/uL (ref 4.0–10.5)
nRBC: 0 % (ref 0.0–0.2)

## 2021-08-08 LAB — COMPREHENSIVE METABOLIC PANEL
ALT: 11 U/L (ref 0–44)
AST: 16 U/L (ref 15–41)
Albumin: 3.6 g/dL (ref 3.5–5.0)
Alkaline Phosphatase: 80 U/L (ref 38–126)
Anion gap: 9 (ref 5–15)
BUN: 17 mg/dL (ref 8–23)
CO2: 27 mmol/L (ref 22–32)
Calcium: 8.9 mg/dL (ref 8.9–10.3)
Chloride: 105 mmol/L (ref 98–111)
Creatinine, Ser: 1.18 mg/dL — ABNORMAL HIGH (ref 0.44–1.00)
GFR, Estimated: 46 mL/min — ABNORMAL LOW (ref >60)
Glucose, Bld: 102 mg/dL — ABNORMAL HIGH (ref 70–99)
Potassium: 3.6 mmol/L (ref 3.5–5.1)
Sodium: 141 mmol/L (ref 135–145)
Total Bilirubin: 0.6 mg/dL (ref 0.3–1.2)
Total Protein: 7 g/dL (ref 6.5–8.1)

## 2021-08-08 LAB — FERRITIN: Ferritin: 277 ng/mL (ref 11–307)

## 2021-08-08 LAB — IRON AND TIBC
Iron: 56 ug/dL (ref 28–170)
Saturation Ratios: 17 % (ref 10.4–31.8)
TIBC: 332 ug/dL (ref 250–450)
UIBC: 276 ug/dL

## 2021-08-08 LAB — VITAMIN D 25 HYDROXY (VIT D DEFICIENCY, FRACTURES): Vit D, 25-Hydroxy: 34.58 ng/mL (ref 30–100)

## 2021-08-12 DIAGNOSIS — I10 Essential (primary) hypertension: Secondary | ICD-10-CM | POA: Diagnosis not present

## 2021-08-12 DIAGNOSIS — M1009 Idiopathic gout, multiple sites: Secondary | ICD-10-CM | POA: Diagnosis not present

## 2021-08-12 DIAGNOSIS — I5032 Chronic diastolic (congestive) heart failure: Secondary | ICD-10-CM | POA: Diagnosis not present

## 2021-08-12 DIAGNOSIS — I480 Paroxysmal atrial fibrillation: Secondary | ICD-10-CM | POA: Diagnosis not present

## 2021-08-15 ENCOUNTER — Inpatient Hospital Stay (HOSPITAL_COMMUNITY): Payer: Medicare Other | Admitting: Hematology

## 2021-08-27 DIAGNOSIS — I10 Essential (primary) hypertension: Secondary | ICD-10-CM | POA: Diagnosis not present

## 2021-09-03 DIAGNOSIS — I517 Cardiomegaly: Secondary | ICD-10-CM | POA: Diagnosis not present

## 2021-09-03 DIAGNOSIS — R0602 Shortness of breath: Secondary | ICD-10-CM | POA: Diagnosis not present

## 2021-09-03 DIAGNOSIS — J40 Bronchitis, not specified as acute or chronic: Secondary | ICD-10-CM | POA: Diagnosis not present

## 2021-09-03 DIAGNOSIS — Z950 Presence of cardiac pacemaker: Secondary | ICD-10-CM | POA: Diagnosis not present

## 2021-09-03 DIAGNOSIS — H81399 Other peripheral vertigo, unspecified ear: Secondary | ICD-10-CM | POA: Diagnosis not present

## 2021-09-12 ENCOUNTER — Other Ambulatory Visit: Payer: Medicare Other

## 2021-09-16 ENCOUNTER — Ambulatory Visit (INDEPENDENT_AMBULATORY_CARE_PROVIDER_SITE_OTHER): Payer: Medicare Other

## 2021-09-16 DIAGNOSIS — I442 Atrioventricular block, complete: Secondary | ICD-10-CM

## 2021-09-18 LAB — CUP PACEART REMOTE DEVICE CHECK
Battery Impedance: 3353 Ohm
Battery Remaining Longevity: 12 mo
Battery Voltage: 2.71 V
Brady Statistic AP VP Percent: 42 %
Brady Statistic AP VS Percent: 0 %
Brady Statistic AS VP Percent: 58 %
Brady Statistic AS VS Percent: 0 %
Date Time Interrogation Session: 20230516171209
Implantable Lead Implant Date: 20150108
Implantable Lead Implant Date: 20150108
Implantable Lead Location: 753859
Implantable Lead Location: 753860
Implantable Lead Model: 5076
Implantable Lead Model: 5076
Implantable Pulse Generator Implant Date: 20150108
Lead Channel Impedance Value: 405 Ohm
Lead Channel Impedance Value: 418 Ohm
Lead Channel Pacing Threshold Amplitude: 0.625 V
Lead Channel Pacing Threshold Amplitude: 1.625 V
Lead Channel Pacing Threshold Pulse Width: 0.4 ms
Lead Channel Pacing Threshold Pulse Width: 0.4 ms
Lead Channel Setting Pacing Amplitude: 2.5 V
Lead Channel Setting Pacing Amplitude: 3.25 V
Lead Channel Setting Pacing Pulse Width: 0.4 ms
Lead Channel Setting Sensing Sensitivity: 2 mV

## 2021-09-19 DIAGNOSIS — J069 Acute upper respiratory infection, unspecified: Secondary | ICD-10-CM | POA: Diagnosis not present

## 2021-09-26 ENCOUNTER — Telehealth: Payer: Self-pay | Admitting: Cardiology

## 2021-10-04 NOTE — Progress Notes (Signed)
Remote pacemaker transmission.   

## 2021-10-10 ENCOUNTER — Ambulatory Visit (HOSPITAL_COMMUNITY): Payer: Medicare Other | Admitting: Hematology

## 2021-10-10 DIAGNOSIS — N3090 Cystitis, unspecified without hematuria: Secondary | ICD-10-CM | POA: Diagnosis not present

## 2021-10-14 ENCOUNTER — Other Ambulatory Visit (HOSPITAL_COMMUNITY): Payer: Self-pay

## 2021-10-14 DIAGNOSIS — C50911 Malignant neoplasm of unspecified site of right female breast: Secondary | ICD-10-CM

## 2021-10-14 DIAGNOSIS — D508 Other iron deficiency anemias: Secondary | ICD-10-CM

## 2021-10-14 NOTE — Progress Notes (Signed)
Orders placed per Dr. Tomie China note from 02/14/21 repeat labs in 6 months.

## 2021-10-15 ENCOUNTER — Inpatient Hospital Stay (HOSPITAL_COMMUNITY): Payer: Medicare Other | Attending: Hematology

## 2021-10-15 DIAGNOSIS — Z853 Personal history of malignant neoplasm of breast: Secondary | ICD-10-CM | POA: Insufficient documentation

## 2021-10-15 DIAGNOSIS — Z90710 Acquired absence of both cervix and uterus: Secondary | ICD-10-CM | POA: Diagnosis not present

## 2021-10-15 DIAGNOSIS — M8580 Other specified disorders of bone density and structure, unspecified site: Secondary | ICD-10-CM | POA: Insufficient documentation

## 2021-10-15 DIAGNOSIS — D509 Iron deficiency anemia, unspecified: Secondary | ICD-10-CM | POA: Diagnosis not present

## 2021-10-15 DIAGNOSIS — D508 Other iron deficiency anemias: Secondary | ICD-10-CM

## 2021-10-15 DIAGNOSIS — N1831 Chronic kidney disease, stage 3a: Secondary | ICD-10-CM | POA: Diagnosis not present

## 2021-10-15 DIAGNOSIS — R0602 Shortness of breath: Secondary | ICD-10-CM | POA: Diagnosis not present

## 2021-10-15 DIAGNOSIS — I129 Hypertensive chronic kidney disease with stage 1 through stage 4 chronic kidney disease, or unspecified chronic kidney disease: Secondary | ICD-10-CM | POA: Insufficient documentation

## 2021-10-15 DIAGNOSIS — C50911 Malignant neoplasm of unspecified site of right female breast: Secondary | ICD-10-CM

## 2021-10-15 LAB — VITAMIN D 25 HYDROXY (VIT D DEFICIENCY, FRACTURES): Vit D, 25-Hydroxy: 34.01 ng/mL (ref 30–100)

## 2021-10-15 LAB — COMPREHENSIVE METABOLIC PANEL
ALT: 12 U/L (ref 0–44)
AST: 16 U/L (ref 15–41)
Albumin: 3.6 g/dL (ref 3.5–5.0)
Alkaline Phosphatase: 72 U/L (ref 38–126)
Anion gap: 9 (ref 5–15)
BUN: 18 mg/dL (ref 8–23)
CO2: 26 mmol/L (ref 22–32)
Calcium: 9 mg/dL (ref 8.9–10.3)
Chloride: 106 mmol/L (ref 98–111)
Creatinine, Ser: 1.03 mg/dL — ABNORMAL HIGH (ref 0.44–1.00)
GFR, Estimated: 54 mL/min — ABNORMAL LOW (ref >60)
Glucose, Bld: 189 mg/dL — ABNORMAL HIGH (ref 70–99)
Potassium: 3.3 mmol/L — ABNORMAL LOW (ref 3.5–5.1)
Sodium: 141 mmol/L (ref 135–145)
Total Bilirubin: 0.6 mg/dL (ref 0.3–1.2)
Total Protein: 6.8 g/dL (ref 6.5–8.1)

## 2021-10-15 LAB — IRON AND TIBC
Iron: 40 ug/dL (ref 28–170)
Saturation Ratios: 12 % (ref 10.4–31.8)
TIBC: 332 ug/dL (ref 250–450)
UIBC: 292 ug/dL

## 2021-10-15 LAB — CBC WITH DIFFERENTIAL/PLATELET
Abs Immature Granulocytes: 0.05 10*3/uL (ref 0.00–0.07)
Basophils Absolute: 0 10*3/uL (ref 0.0–0.1)
Basophils Relative: 0 %
Eosinophils Absolute: 0 10*3/uL (ref 0.0–0.5)
Eosinophils Relative: 0 %
HCT: 32.7 % — ABNORMAL LOW (ref 36.0–46.0)
Hemoglobin: 10.5 g/dL — ABNORMAL LOW (ref 12.0–15.0)
Immature Granulocytes: 1 %
Lymphocytes Relative: 7 %
Lymphs Abs: 0.6 10*3/uL — ABNORMAL LOW (ref 0.7–4.0)
MCH: 31.2 pg (ref 26.0–34.0)
MCHC: 32.1 g/dL (ref 30.0–36.0)
MCV: 97 fL (ref 80.0–100.0)
Monocytes Absolute: 0.5 10*3/uL (ref 0.1–1.0)
Monocytes Relative: 6 %
Neutro Abs: 6.6 10*3/uL (ref 1.7–7.7)
Neutrophils Relative %: 86 %
Platelets: 188 10*3/uL (ref 150–400)
RBC: 3.37 MIL/uL — ABNORMAL LOW (ref 3.87–5.11)
RDW: 13.6 % (ref 11.5–15.5)
WBC: 7.7 10*3/uL (ref 4.0–10.5)
nRBC: 0 % (ref 0.0–0.2)

## 2021-10-15 LAB — FERRITIN: Ferritin: 196 ng/mL (ref 11–307)

## 2021-10-16 NOTE — Progress Notes (Signed)
[name], [place] [address]   CLINIC:  Medical Oncology/Hematology  PCP:  [name], MD [place] / [place] [address] [phone]   REASON FOR VISIT:  Follow-up for history of stage I right breast lobular carcinoma (2016)  PRIOR THERAPY:  Lumpectomy (09/20/2014)  CURRENT THERAPY: Anastrozole (started 09/28/2014)  BRIEF ONCOLOGIC HISTORY:  Oncology History  Lobular carcinoma of right breast ([place])  08/30/2014 Mammogram   Ill-defined shadowing mass at 10:00 position 6 cm from the nipple measuring [phone] cm in size without lymphadenopathy seen in the right axilla.   09/06/2014 Procedure   Biopsy of right breast mass at 10 o'clock position.   09/08/2014 Pathology Results   Invasive carcinoma, favor invasive ductal carcinoma.  Some features are suggestive of lobular carcinoma.  Negative e-cadherin immunostaining throughout the lesional cells supports the diagnosis of invasive lobular carcinoma.   09/08/2014 Pathology Results   ER 90%, PR 90%. HER2 FISH negative, Ki-67 26%.   09/20/2014 Definitive Surgery   Lumpectomy   09/20/2014 Pathology Results   Invasive lobular carcinoma, 0.6 cm in maximal dimension, closest margin is 0.3 cm at posterior resection margin. Grade 2. No LV I. 0/1 lymph nodes.   09/27/2014 -  Anti-estrogen oral therapy   Arimidex   08/08/2015 Mammogram   BIRADS 2   09/20/2015 Procedure   Right breast lumpectomy by Dr. Anthony Sar with sentinel lymph node biopsy.     CANCER STAGING:  Cancer Staging  Lobular carcinoma of right breast [facility]) Staging form: Breast, AJCC 7th Edition - Pathologic stage from 09/20/2014: Stage IA (T1b, N0, cM0) - Signed by Baird Cancer, PA-C on 01/15/2016   INTERVAL HISTORY:  Alicia Nash, a 81 y.o. female, returns for routine follow-up of her right-sided breast cancer. Alicia Nash was last seen on 02/14/2021 by Dr. Delton Coombes.   At today's visit, she  reports feeling  fairly well.  She denies any recent hospitalizations, surgeries, or changes in her  baseline health status.  Most recent mammogram on 11/12/2020 was BI-RADS Category 1.  She denies any symptoms of recurrence such as new lumps, bone pain, chest pain, or abdominal pain.  She has no new headaches, seizures, or focal neurologic deficits.  No B symptoms such as fever, chills, unintentional weight loss.  No cold night sweats, but occasional hot flashes.  She does report intermittent shortness of breath, and has an appointment to see her primary care provider later today.  She continues to take Arimidex, and reports that she has hot flashes 2-3 times each month. She has fallen twice this year, but has not broken any bones.  Regarding her iron deficiency anemia with history of possible bleeding AVMs, she reports that she has occasional black and tarry bowel movements every few months.  Her energy was improved after her last IV iron infusion, but is starting to "get low" again.  She denies any pica, restless legs, headaches, chest pain, dyspnea on exertion, lightheadedness, or syncope.  She reports 50% energy and 75% appetite.  She is maintaining stable weight at this time.   REVIEW OF SYSTEMS:    Review of Systems  Constitutional:  Positive for fatigue. Negative for appetite change, chills, diaphoresis, fever and unexpected weight change.  HENT:   Negative for lump/mass and nosebleeds.   Eyes:  Negative for eye problems.  Respiratory:  Positive for cough and shortness of breath. Negative for hemoptysis.   Cardiovascular:  Negative for chest pain, leg  swelling and palpitations.  Gastrointestinal:  Negative for abdominal pain, blood in stool, constipation, diarrhea, nausea and vomiting.  Genitourinary:  Negative for hematuria.   Skin: Negative.   Neurological:  Negative for dizziness, headaches and light-headedness.  Hematological:  Does not bruise/bleed easily.    PAST MEDICAL/SURGICAL HISTORY:   Past Medical History:  Diagnosis Date   Arthritis    Cellulitis    Right leg   Hyperlipidemia    Hypertension    Lobular carcinoma of right breast ([place]) 09/27/2014   Malignant neoplasm of upper-outer quadrant of right female breast ([place]) 09/27/2014   Paroxysmal atrial fibrillation (HCC)    Pulmonary hypertension ([place])    Thoracic ascending aortic aneurysm (HCC)    Varicose veins    Past Surgical History:  Procedure Laterality Date   ABDOMINAL HYSTERECTOMY     CHOLECYSTECTOMY     ESOPHAGOGASTRODUODENOSCOPY N/A 01/13/2018   Procedure: ESOPHAGOGASTRODUODENOSCOPY (EGD);  Surgeon: Rogene Houston, MD;  Location: [facility];  Service: Endoscopy;  Laterality: N/A;   FOOT SURGERY     Multiple foot surgeries by Dr. Iona Coach CAPSULE STUDY N/A 08/26/2017   Procedure: GIVENS CAPSULE STUDY;  Surgeon: Rogene Houston, MD;  Location: [facility];  Service: Endoscopy;  Laterality: N/A;  8:30   JOINT REPLACEMENT     bilateral knee by Drs. McGee and McGinley   PACEMAKER IMPLANT Left 05/12/2013   MDT Sherril Croon PPM implanted by Dr Dot Lanes for CHB and SSS   SHOULDER SURGERY      SOCIAL HISTORY:  Social History   Socioeconomic History   Marital status: Divorced    Spouse name: Not on file   Number of children: Not on file   Years of education: Not on file   Highest education level: Not on file  Occupational History   Not on file  Tobacco Use   Smoking status: Never   Smokeless tobacco: Never  Vaping Use   Vaping Use: Never used  Substance and Sexual Activity   Alcohol use: No   Drug use: No   Sexual activity: Not on file  Other Topics Concern   Not on file  Social History Narrative   Lives in [place]   Divorced   Retired from [employer] Strain: [place]  (03/08/2020)   Overall Financial Resource Strain (CARDIA)    Difficulty of Paying Living Expenses: Not hard at all  Food Insecurity: No [place] (03/08/2020)   Hunger Vital Sign    Worried About Running Out of Food in the Last Year: Never true    [place] in the Last Year: Never true  Transportation Needs: No Transportation Needs (03/08/2020)   PRAPARE - Hydrologist (Medical): No    Lack of Transportation (Non-Medical): No  Physical Activity: Insufficiently Active (03/08/2020)   Exercise Vital Sign    Days of Exercise per Week: 2 days    Minutes of Exercise per Session: 30 min  Stress: No Stress Concern Present (03/08/2020)   [place]    Feeling of Stress : Not at all  Social Connections: Moderately Isolated (03/08/2020)   Social Connection and Isolation Panel [NHANES]    Frequency of Communication with Friends and Family: More than three times a week    Frequency of Social Gatherings with Friends and Family: More than three times a week  Attends Religious Services: More than 4 times per year    Active Member of Clubs or Organizations: No    Attends Archivist Meetings: Never    Marital Status: Widowed  Intimate Partner Violence: Not At Risk (03/08/2020)   Humiliation, Afraid, Rape, and Kick questionnaire    Fear of Current or Ex-Partner: No    Emotionally Abused: No    Physically Abused: No    Sexually Abused: No    FAMILY HISTORY:  Family History  Problem Relation Age of Onset   Hypertension Other    Diabetes Mother     CURRENT MEDICATIONS:  Current Outpatient Medications  Medication Sig Dispense Refill   acetaminophen (TYLENOL) 500 MG tablet Take 500 mg by mouth daily as needed for mild pain or moderate pain.      albuterol (VENTOLIN HFA) 108 (90 Base) MCG/ACT inhaler      anastrozole (ARIMIDEX) 1 MG tablet Take 1 tablet (1 mg total) by mouth daily. 30 tablet 5   apixaban (ELIQUIS) 2.5 MG TABS tablet Take 1 tablet (2.5 mg total) by mouth 2 (two) times daily. 60 tablet 1   carvedilol (COREG)  6.25 MG tablet      cetirizine (ZYRTEC) 10 MG tablet Take 10 mg by mouth daily as needed for allergies.  0   citalopram (CELEXA) 20 MG tablet Take 20 mg by mouth every morning.      Colchicine 0.6 MG CAPS Take 1 capsule by mouth 2 (two) times daily.     Cranberry 400 MG CAPS Take 1 tablet by mouth every morning.      diazepam (VALIUM) 5 MG tablet Take 1 tablet 2 (two) times daily by mouth.     docusate sodium (COLACE) 100 MG capsule Take 100 mg by mouth every morning.     furosemide (LASIX) 40 MG tablet Take 1 tablet by mouth every morning.      gemfibrozil (LOPID) 600 MG tablet Take 1 tablet by mouth every morning.      hydrALAZINE (APRESOLINE) 25 MG tablet Take 25 mg by mouth 2 (two) times daily.     Multiple Vitamin (MULTIVITAMIN) tablet Take 1 tablet by mouth every morning.      olmesartan (BENICAR) 40 MG tablet Take 1 tablet (40 mg total) by mouth daily. 30 tablet 11   pantoprazole (PROTONIX) 40 MG tablet Take by mouth.     PNEUMOVAX 23 25 MCG/0.5ML injection      Promethazine HCl 6.25 MG/5ML SOLN Take 5 mLs by mouth every 6 (six) hours as needed.     rosuvastatin (CRESTOR) 10 MG tablet Take 10 mg by mouth daily.     silver sulfADIAZINE (SILVADENE) 1 % cream Apply topically See admin instructions.     sodium bicarbonate 650 MG tablet Take by mouth.     traMADol (ULTRAM) 50 MG tablet Take 50 mg by mouth every 6 (six) hours as needed.     triamcinolone cream (KENALOG) 0.1 % APPLY A THIN LAYER TO THE AFFECTED AREAS DAILY     ULORIC 40 MG tablet Take 1 tablet by mouth every morning.      verapamil (VERELAN PM) 360 MG 24 hr capsule Take 360 mg by mouth every morning.      vitamin C (ASCORBIC ACID) 500 MG tablet Take 500 mg by mouth every morning.      Vitamin D, Ergocalciferol, (DRISDOL) 1.25 MG (50000 UNIT) CAPS capsule TAKE ONE CAPSULE BY MOUTH ONCE A WEEK. 12 capsule 2   No current  facility-administered medications for this visit.   Facility-Administered Medications Ordered in Other  Visits  Medication Dose Route Frequency Provider Last Rate Last Admin   0.9 %  sodium chloride infusion   Intravenous Continuous Derek Jack, MD 20 mL/hr at 05/07/18 1447 New Bag at 05/07/18 1447    ALLERGIES:  Allergies  Allergen Reactions   Dihydrocodeine Other (See Comments)    No reaction noted. "funny feeling"   Other     Blisters after wear compression stockings    PHYSICAL EXAM:    Performance status (ECOG): 1 - Symptomatic but completely ambulatory  Vitals:   10/17/21 1132  BP: (!) 185/71  Pulse: 71  Resp: (!) 22  Temp: 98.4 F (36.9 C)  SpO2: 95%   Wt Readings from Last 3 Encounters:  10/17/21 185 lb 3.5 oz (84 kg)  03/27/21 174 lb (78.9 kg)  03/08/21 175 lb (79.4 kg)   Physical Exam Vitals reviewed.  Constitutional:      Appearance: Normal appearance. She is obese.  Cardiovascular:     Rate and Rhythm: Normal rate and regular rhythm.     Pulses: Normal pulses.     Heart sounds: Normal heart sounds.  Pulmonary:     Effort: Pulmonary effort is normal.     Breath sounds: Normal breath sounds.  Chest:  Breasts:    Right: Normal. No inverted nipple, mass, nipple discharge, skin change (UOQ lumpectomy scar within normal limits) or tenderness.     Left: Normal. No inverted nipple, mass, nipple discharge, skin change or tenderness.     Comments: Left upper chest implanted pacemaker. Abdominal:     Palpations: Abdomen is soft. There is no hepatomegaly, splenomegaly or mass.     Tenderness: There is no abdominal tenderness.  Musculoskeletal:     Right lower leg: No edema.     Left lower leg: No edema.  Lymphadenopathy:     Upper Body:     Right upper body: No supraclavicular, axillary or pectoral adenopathy.     Left upper body: No supraclavicular, axillary or pectoral adenopathy.  Neurological:     General: No focal deficit present.     Mental Status: She is alert and oriented to person, place, and time.  Psychiatric:        Mood and Affect: Mood  normal.        Behavior: Behavior normal.      LABORATORY DATA:  I have reviewed the labs as listed.     Latest Ref Rng & Units 10/15/2021   11:03 AM 08/08/2021    1:15 PM 02/06/2021    2:44 PM  CBC  WBC 4.0 - 10.5 K/uL 7.7  6.2  7.8   Hemoglobin 12.0 - 15.0 g/dL 10.5  10.8  10.6   Hematocrit 36.0 - 46.0 % 32.7  33.4  34.2   Platelets 150 - 400 K/uL 188  217  275       Latest Ref Rng & Units 10/15/2021   11:03 AM 08/08/2021    1:15 PM 02/06/2021    2:44 PM  CMP  Glucose 70 - 99 mg/dL 189  102  146   BUN 8 - 23 mg/dL _0 Creatinine 0.44 - 1.00 mg/dL 1.03  1.18  0.91   Sodium 135 - 145 mmol/L 141  141  142   Potassium 3.5 - 5.1 mmol/L 3.3  3.6  3.7   Chloride 98 - 111 mmol/L 106  105  106  CO2 22 - 32 mmol/L _0 Calcium 8.9 - 10.3 mg/dL 9.0  8.9  8.2   Total Protein 6.5 - 8.1 g/dL 6.8  7.0  7.0   Total Bilirubin 0.3 - 1.2 mg/dL 0.6  0.6  0.8   Alkaline Phos 38 - 126 U/L 72  80  98   AST 15 - 41 U/L _1 ALT 0 - 44 U/L _2 DIAGNOSTIC IMAGING:  I have independently reviewed the scans and discussed with the patient. CUP PACEART REMOTE DEVICE CHECK  Result Date: 09/18/2021 Scheduled remote reviewed. Normal device function.  AF burden 4.1%. Longest episode 19 hrs 62mn. +OAC, controlled rates Next remote 91 days. LA    ASSESSMENT & PLAN: 1.  Stage I (PT 1 BP N0) right breast lobular carcinoma: -Status post lumpectomy on 09/20/2014, 0.6 cm, ER/PR positive, HER-2 negative by FISH, Ki-67 of 26%, Arimidex started on 09/27/2014.  For some reason she did not receive radiation therapy. - She is tolerating anastrozole very well without any major problems.  She has occasional mild hot flashes. - Mammogram on 11/12/2020 was BI-RADS Category 1. - No symptoms concerning for recurrence at this time.   - Physical examination today did not reveal any palpable masses.  Right breast upper outer quadrant lumpectomy scar is within normal limits.  No palpable  adenopathy.   - Labs from 10/15/2021 did not show any liver function abnormalities. - PLAN: Continue Anastrozole.  She is due for mammogram in July 2023.  RTC in 6 months for office visit and breast exam.  2.  Normocytic anemia: -This is from combination of CKD stage IIIa and iron deficiency state. - [facility]  [facility]. - Capsule study on 08/26/2017 by Dr. RLaural Goldenshowed few small bowel punctate AVM with speck of blood, no active bleeding. - Last Feraheme on 02/22/2021 and 03/01/2021 - She has occasional black/tarry bowel movements every few months.  She is taking Eliquis for paroxysmal atrial fibrillation. - She reports worsening fatigue - Labs (10/15/2021): Hgb 10.5/MCV 97.0, ferritin 196, iron saturation 12% - PLAN: Due to worsening anemia and functional iron deficiency, recommend Feraheme x1. - Repeat iron panel and CBC with phone visit in 3 months.    3.  Osteopenia: - DEXA scan on 08/13/2015 shows T score of -1.7 in the left femoral neck.   - DEXA scan on 04/15/2018 shows T score of -1.7 and stable. -Vitamin D is normal at 34.01. - PLAN: We will schedule her for bone density scan.  We will discuss these results at your follow-up visit in 3 months. - Continue vitamin D weekly.     PLAN SUMMARY & DISPOSITION: Feraheme x1 Mammogram in July 2023 Bone density (DEXA) July 2023 Labs in 3 months (CBC, CMP, iron panel) Phone visit in 3 months, after labs Repeat labs and office visit in 6 months for breast exam  All questions were answered. The patient knows to call the clinic with any problems, questions or concerns.  Medical decision making: Moderate  Time spent on visit: I spent 20 minutes counseling the patient face to face. The total time spent in the appointment was 30 minutes and more than 50% was on counseling.   RHarriett Rush PA-C  10/17/2021 1:24  PM

## 2021-10-17 ENCOUNTER — Encounter (HOSPITAL_COMMUNITY): Payer: Self-pay | Admitting: Physician Assistant

## 2021-10-17 ENCOUNTER — Inpatient Hospital Stay (HOSPITAL_BASED_OUTPATIENT_CLINIC_OR_DEPARTMENT_OTHER): Payer: Medicare Other | Admitting: Physician Assistant

## 2021-10-17 VITALS — BP 185/71 | HR 71 | Temp 98.4°F | Resp 22 | Wt 185.2 lb

## 2021-10-17 DIAGNOSIS — N1831 Chronic kidney disease, stage 3a: Secondary | ICD-10-CM | POA: Diagnosis not present

## 2021-10-17 DIAGNOSIS — R0602 Shortness of breath: Secondary | ICD-10-CM | POA: Diagnosis not present

## 2021-10-17 DIAGNOSIS — Z853 Personal history of malignant neoplasm of breast: Secondary | ICD-10-CM | POA: Diagnosis not present

## 2021-10-17 DIAGNOSIS — I129 Hypertensive chronic kidney disease with stage 1 through stage 4 chronic kidney disease, or unspecified chronic kidney disease: Secondary | ICD-10-CM | POA: Diagnosis not present

## 2021-10-17 DIAGNOSIS — M8589 Other specified disorders of bone density and structure, multiple sites: Secondary | ICD-10-CM

## 2021-10-17 DIAGNOSIS — D508 Other iron deficiency anemias: Secondary | ICD-10-CM | POA: Diagnosis not present

## 2021-10-17 DIAGNOSIS — C50911 Malignant neoplasm of unspecified site of right female breast: Secondary | ICD-10-CM

## 2021-10-17 DIAGNOSIS — J440 Chronic obstructive pulmonary disease with acute lower respiratory infection: Secondary | ICD-10-CM | POA: Diagnosis not present

## 2021-10-17 DIAGNOSIS — M8580 Other specified disorders of bone density and structure, unspecified site: Secondary | ICD-10-CM | POA: Diagnosis not present

## 2021-10-17 DIAGNOSIS — D509 Iron deficiency anemia, unspecified: Secondary | ICD-10-CM | POA: Diagnosis not present

## 2021-10-17 NOTE — Patient Instructions (Signed)
[place] at [facility] Discharge Instructions  You were seen today by Tarri Abernethy PA-C for your follow-up visit.  HISTORY OF BREAST CANCER: - Continue anastrozole (Arimidex) daily. - You will be due for mammogram in July 2023.  We will schedule this today. - Your next breast exam will be in 6 months.  ANEMIA: - We will schedule you for IV iron x1 dose. - We will repeat iron and anemia labs in 3 months.  We will discuss these results with a telephone visit.  OSTEOPENIA: - We will schedule you for bone density scan. - Continue vitamin D weekly.  LABS: Return in 3 months for repeat labs  OTHER TESTS: Mammogram, bone density scan  MEDICATIONS: Continue Arimidex and vitamin D.  FOLLOW-UP APPOINTMENT: Telephone visit in 3 months.  Office visit in 6 months.   Thank you for choosing [place] at [facility] to provide your oncology and hematology care.  To afford each patient quality time with our provider, please arrive at least 15 minutes before your scheduled appointment time.   If you have a lab appointment with the [facility] please come in thru the Main Entrance and check in at the main information desk.  You need to re-schedule your appointment should you arrive 10 or more minutes late.  We strive to give you quality time with our providers, and arriving late affects you and other patients whose appointments are after yours.  Also, if you no show three or more times for appointments you may be dismissed from the clinic at the providers discretion.     Again, thank you for choosing [facility].  Our hope is that these requests will decrease the amount of time that you wait before being seen by our physicians.       _____________________________________________________________  Should you have questions after your visit to [facility], please contact our office at [phone] and follow the  prompts.  Our office hours are 8:00 a.m. and 4:30 p.m. Monday - Friday.  Please note that voicemails left after 4:00 p.m. may not be returned until the following business day.  We are closed weekends and major holidays.  You do have access to a nurse 24-7, just call the main number to the clinic [phone] and do not press any options, hold on the line and a nurse will answer the phone.    For prescription refill requests, have your pharmacy contact our office and allow 72 hours.    Due to Covid, you will need to wear a mask upon entering the hospital. If you do not have a mask, a mask will be given to you at the Main Entrance upon arrival. For doctor visits, patients may have 1 support person age 82 or older with them. For treatment visits, patients can not have anyone with them due to social distancing guidelines and our immunocompromised population.

## 2021-10-21 ENCOUNTER — Inpatient Hospital Stay (HOSPITAL_COMMUNITY): Payer: Medicare Other

## 2021-10-21 VITALS — BP 174/83 | HR 60 | Temp 97.8°F | Resp 18

## 2021-10-21 DIAGNOSIS — R0602 Shortness of breath: Secondary | ICD-10-CM | POA: Diagnosis not present

## 2021-10-21 DIAGNOSIS — Z853 Personal history of malignant neoplasm of breast: Secondary | ICD-10-CM | POA: Diagnosis not present

## 2021-10-21 DIAGNOSIS — D508 Other iron deficiency anemias: Secondary | ICD-10-CM

## 2021-10-21 DIAGNOSIS — N1831 Chronic kidney disease, stage 3a: Secondary | ICD-10-CM | POA: Diagnosis not present

## 2021-10-21 DIAGNOSIS — M8580 Other specified disorders of bone density and structure, unspecified site: Secondary | ICD-10-CM | POA: Diagnosis not present

## 2021-10-21 DIAGNOSIS — I129 Hypertensive chronic kidney disease with stage 1 through stage 4 chronic kidney disease, or unspecified chronic kidney disease: Secondary | ICD-10-CM | POA: Diagnosis not present

## 2021-10-21 DIAGNOSIS — D509 Iron deficiency anemia, unspecified: Secondary | ICD-10-CM | POA: Diagnosis not present

## 2021-10-21 MED ORDER — LORATADINE 10 MG PO TABS: 10.0000 mg | ORAL_TABLET | Freq: Once | ORAL | Status: DC

## 2021-10-21 MED ORDER — SODIUM CHLORIDE 0.9 % IV SOLN: Freq: Once | INTRAVENOUS | Status: AC

## 2021-10-21 MED ORDER — SODIUM CHLORIDE 0.9 % IV SOLN
510.0000 mg | Freq: Once | INTRAVENOUS | Status: AC
  Administered 2021-10-21: 510 mg via INTRAVENOUS
  Filled 2021-10-21: qty 17

## 2021-10-21 MED ORDER — ACETAMINOPHEN 325 MG PO TABS: 650.0000 mg | ORAL_TABLET | Freq: Once | ORAL | Status: DC

## 2021-10-21 NOTE — Progress Notes (Signed)
Pt presents today for Feraheme IV iron infusion per provider's order. Vital signs stable and pt voiced no new complaints at this time.  Peripheral IV started with good blood return pre and post infusion.  Feraheme IV iron given today per MD orders. Tolerated infusion without adverse affects. Vital signs stable. No complaints at this time. Discharged from clinic ambulatory with cane in stable condition. Alert and oriented x 3. F/U with [facility] as scheduled.

## 2021-10-21 NOTE — Patient Instructions (Signed)
[facility]  Discharge Instructions: Thank you for choosing [facility] to provide your oncology and hematology care.  If you have a lab appointment with the [facility], please come in thru the Main Entrance and check in at the main information desk.  Wear comfortable clothing and clothing appropriate for easy access to any Portacath or PICC line.   We strive to give you quality time with your provider. You may need to reschedule your appointment if you arrive late (15 or more minutes).  Arriving late affects you and other patients whose appointments are after yours.  Also, if you miss three or more appointments without notifying the office, you may be dismissed from the clinic at the provider's discretion.      For prescription refill requests, have your pharmacy contact our office and allow 72 hours for refills to be completed.    Today you received Feraheme IV iron     BELOW ARE SYMPTOMS THAT SHOULD BE REPORTED IMMEDIATELY: *FEVER GREATER THAN 100.4 F (38 C) OR HIGHER *CHILLS OR SWEATING *NAUSEA AND VOMITING THAT IS NOT CONTROLLED WITH YOUR NAUSEA MEDICATION *UNUSUAL SHORTNESS OF BREATH *UNUSUAL BRUISING OR BLEEDING *URINARY PROBLEMS (pain or burning when urinating, or frequent urination) *BOWEL PROBLEMS (unusual diarrhea, constipation, pain near the anus) TENDERNESS IN MOUTH AND THROAT WITH OR WITHOUT PRESENCE OF ULCERS (sore throat, sores in mouth, or a toothache) UNUSUAL RASH, SWELLING OR PAIN  UNUSUAL VAGINAL DISCHARGE OR ITCHING   Items with * indicate a potential emergency and should be followed up as soon as possible or go to the Emergency Department if any problems should occur.  Please show the CHEMOTHERAPY ALERT CARD or IMMUNOTHERAPY ALERT CARD at check-in to the Emergency Department and triage nurse.  Should you have questions after your visit or need to cancel or reschedule your appointment, please contact [facility]  [phone]  and follow the prompts.  Office hours are 8:00 a.m. to 4:30 p.m. Monday - Friday. Please note that voicemails left after 4:00 p.m. may not be returned until the following business day.  We are closed weekends and major holidays. You have access to a nurse at all times for urgent questions. Please call the main number to the clinic [phone] and follow the prompts.  For any non-urgent questions, you may also contact your provider using MyChart. We now offer e-Visits for anyone 70 and older to request care online for non-urgent symptoms. For details visit [email or web address].   Also download the MyChart app! Go to the app store, search "MyChart", open the app, select [facility], and log in with your MyChart username and password.  Masks are optional in the cancer centers. If you would like for your care team to wear a mask while they are taking care of you, please let them know. For doctor visits, patients may have with them one support person who is at least 82 years old. At this time, visitors are not allowed in the infusion area.

## 2021-10-31 ENCOUNTER — Ambulatory Visit (INDEPENDENT_AMBULATORY_CARE_PROVIDER_SITE_OTHER): Payer: Medicare Other

## 2021-10-31 DIAGNOSIS — I5032 Chronic diastolic (congestive) heart failure: Secondary | ICD-10-CM

## 2021-10-31 LAB — ECHOCARDIOGRAM COMPLETE
Area-P 1/2: 2.63 cm2
Calc EF: 60 %
MV M vel: 3.59 m/s
MV Peak grad: 51.4 mmHg
P 1/2 time: 831 msec
S' Lateral: 2.59 cm
Single Plane A2C EF: 55.3 %
Single Plane A4C EF: 63.2 %

## 2021-11-01 ENCOUNTER — Telehealth: Payer: Self-pay | Admitting: *Deleted

## 2021-11-01 NOTE — Telephone Encounter (Signed)
Laurine Blazer, LPN  3/64/6803  2:12 PM EDT Back to Top    Notified, copy to pcp.    Laurine Blazer, LPN  2/48/2500  3:70 PM EDT     Left message to return call.   Satira Sark, MD  10/31/2021  2:05 PM EDT     Results reviewed.  Follow-up echocardiogram shows normal LVEF at 60 to 65% with mild LVH, RVSP now normal range and mitral regurgitation only mild.  Keep office follow-up as scheduled.

## 2021-11-07 DIAGNOSIS — N390 Urinary tract infection, site not specified: Secondary | ICD-10-CM | POA: Diagnosis not present

## 2021-11-07 DIAGNOSIS — B952 Enterococcus as the cause of diseases classified elsewhere: Secondary | ICD-10-CM | POA: Diagnosis not present

## 2021-11-15 ENCOUNTER — Ambulatory Visit (HOSPITAL_COMMUNITY)
Admission: RE | Admit: 2021-11-15 | Discharge: 2021-11-15 | Disposition: A | Discharge status: Home / Self Care | Payer: Medicare Other | Source: Ambulatory Visit | Attending: Physician Assistant | Admitting: Physician Assistant

## 2021-11-15 DIAGNOSIS — C50911 Malignant neoplasm of unspecified site of right female breast: Secondary | ICD-10-CM

## 2021-11-15 DIAGNOSIS — M8589 Other specified disorders of bone density and structure, multiple sites: Secondary | ICD-10-CM | POA: Diagnosis not present

## 2021-11-15 DIAGNOSIS — Z13820 Encounter for screening for osteoporosis: Secondary | ICD-10-CM | POA: Insufficient documentation

## 2021-11-15 DIAGNOSIS — Z780 Asymptomatic menopausal state: Secondary | ICD-10-CM | POA: Insufficient documentation

## 2021-11-15 DIAGNOSIS — Z1231 Encounter for screening mammogram for malignant neoplasm of breast: Secondary | ICD-10-CM | POA: Insufficient documentation

## 2021-11-15 DIAGNOSIS — Z853 Personal history of malignant neoplasm of breast: Secondary | ICD-10-CM | POA: Diagnosis not present

## 2021-12-02 DIAGNOSIS — M545 Low back pain, unspecified: Secondary | ICD-10-CM | POA: Diagnosis not present

## 2021-12-12 ENCOUNTER — Other Ambulatory Visit: Payer: Self-pay | Admitting: Internal Medicine

## 2021-12-16 ENCOUNTER — Ambulatory Visit: Payer: Medicare Other

## 2021-12-17 LAB — CUP PACEART REMOTE DEVICE CHECK
Battery Impedance: 3779 Ohm
Battery Remaining Longevity: 10 mo
Battery Voltage: 2.69 V
Brady Statistic AP VP Percent: 42 %
Brady Statistic AP VS Percent: 0 %
Brady Statistic AS VP Percent: 58 %
Brady Statistic AS VS Percent: 0 %
Date Time Interrogation Session: 20230814070409
Implantable Lead Implant Date: 20150108
Implantable Lead Implant Date: 20150108
Implantable Lead Location: 753859
Implantable Lead Location: 753860
Implantable Lead Model: 5076
Implantable Lead Model: 5076
Implantable Pulse Generator Implant Date: 20150108
Lead Channel Impedance Value: 447 Ohm
Lead Channel Impedance Value: 464 Ohm
Lead Channel Pacing Threshold Amplitude: 0.5 V
Lead Channel Pacing Threshold Amplitude: 1.375 V
Lead Channel Pacing Threshold Pulse Width: 0.4 ms
Lead Channel Pacing Threshold Pulse Width: 0.4 ms
Lead Channel Setting Pacing Amplitude: 2.5 V
Lead Channel Setting Pacing Amplitude: 2.75 V
Lead Channel Setting Pacing Pulse Width: 0.4 ms
Lead Channel Setting Sensing Sensitivity: 2 mV

## 2021-12-31 DIAGNOSIS — J680 Bronchitis and pneumonitis due to chemicals, gases, fumes and vapors: Secondary | ICD-10-CM | POA: Diagnosis not present

## 2022-01-14 ENCOUNTER — Other Ambulatory Visit: Payer: Self-pay | Admitting: *Deleted

## 2022-01-14 ENCOUNTER — Other Ambulatory Visit (HOSPITAL_COMMUNITY): Payer: Self-pay | Admitting: Hematology

## 2022-01-14 DIAGNOSIS — C50919 Malignant neoplasm of unspecified site of unspecified female breast: Secondary | ICD-10-CM

## 2022-01-14 NOTE — Telephone Encounter (Signed)
Anastrozole refill approved.  Patient tolerating and is to continue therapy.

## 2022-01-17 ENCOUNTER — Inpatient Hospital Stay: Payer: Medicare Other | Attending: Physician Assistant

## 2022-01-17 DIAGNOSIS — D649 Anemia, unspecified: Secondary | ICD-10-CM | POA: Diagnosis not present

## 2022-01-17 DIAGNOSIS — Z853 Personal history of malignant neoplasm of breast: Secondary | ICD-10-CM | POA: Insufficient documentation

## 2022-01-17 DIAGNOSIS — M8580 Other specified disorders of bone density and structure, unspecified site: Secondary | ICD-10-CM | POA: Insufficient documentation

## 2022-01-17 DIAGNOSIS — D508 Other iron deficiency anemias: Secondary | ICD-10-CM

## 2022-01-17 LAB — IRON AND TIBC
Iron: 68 ug/dL (ref 28–170)
Saturation Ratios: 18 % (ref 10.4–31.8)
TIBC: 387 ug/dL (ref 250–450)
UIBC: 319 ug/dL

## 2022-01-17 LAB — CBC WITH DIFFERENTIAL/PLATELET
Abs Immature Granulocytes: 0.02 10*3/uL (ref 0.00–0.07)
Basophils Absolute: 0.1 10*3/uL (ref 0.0–0.1)
Basophils Relative: 1 %
Eosinophils Absolute: 0.3 10*3/uL (ref 0.0–0.5)
Eosinophils Relative: 5 %
HCT: 35.2 % — ABNORMAL LOW (ref 36.0–46.0)
Hemoglobin: 11.4 g/dL — ABNORMAL LOW (ref 12.0–15.0)
Immature Granulocytes: 0 %
Lymphocytes Relative: 17 %
Lymphs Abs: 1.1 10*3/uL (ref 0.7–4.0)
MCH: 31.8 pg (ref 26.0–34.0)
MCHC: 32.4 g/dL (ref 30.0–36.0)
MCV: 98.1 fL (ref 80.0–100.0)
Monocytes Absolute: 0.6 10*3/uL (ref 0.1–1.0)
Monocytes Relative: 9 %
Neutro Abs: 4.6 10*3/uL (ref 1.7–7.7)
Neutrophils Relative %: 68 %
Platelets: 203 10*3/uL (ref 150–400)
RBC: 3.59 MIL/uL — ABNORMAL LOW (ref 3.87–5.11)
RDW: 14.4 % (ref 11.5–15.5)
WBC: 6.6 10*3/uL (ref 4.0–10.5)
nRBC: 0 % (ref 0.0–0.2)

## 2022-01-17 LAB — COMPREHENSIVE METABOLIC PANEL
ALT: 15 U/L (ref 0–44)
AST: 19 U/L (ref 15–41)
Albumin: 3.9 g/dL (ref 3.5–5.0)
Alkaline Phosphatase: 77 U/L (ref 38–126)
Anion gap: 9 (ref 5–15)
BUN: 22 mg/dL (ref 8–23)
CO2: 22 mmol/L (ref 22–32)
Calcium: 9.5 mg/dL (ref 8.9–10.3)
Chloride: 110 mmol/L (ref 98–111)
Creatinine, Ser: 1.42 mg/dL — ABNORMAL HIGH (ref 0.44–1.00)
GFR, Estimated: 37 mL/min — ABNORMAL LOW (ref >60)
Glucose, Bld: 110 mg/dL — ABNORMAL HIGH (ref 70–99)
Potassium: 4 mmol/L (ref 3.5–5.1)
Sodium: 141 mmol/L (ref 135–145)
Total Bilirubin: 0.4 mg/dL (ref 0.3–1.2)
Total Protein: 7 g/dL (ref 6.5–8.1)

## 2022-01-17 LAB — FERRITIN: Ferritin: 264 ng/mL (ref 11–307)

## 2022-01-23 DIAGNOSIS — J301 Allergic rhinitis due to pollen: Secondary | ICD-10-CM | POA: Diagnosis not present

## 2022-01-23 DIAGNOSIS — R600 Localized edema: Secondary | ICD-10-CM | POA: Diagnosis not present

## 2022-01-24 ENCOUNTER — Telehealth: Payer: Self-pay | Admitting: Physician Assistant

## 2022-01-24 ENCOUNTER — Inpatient Hospital Stay: Payer: Medicare Other | Admitting: Physician Assistant

## 2022-01-24 NOTE — Telephone Encounter (Signed)
Unable to reach patient for her telephone visit today.  We will attempt to reach her to reschedule.

## 2022-02-04 DIAGNOSIS — R600 Localized edema: Secondary | ICD-10-CM | POA: Diagnosis not present

## 2022-02-04 DIAGNOSIS — M7989 Other specified soft tissue disorders: Secondary | ICD-10-CM | POA: Diagnosis not present

## 2022-02-04 DIAGNOSIS — M79661 Pain in right lower leg: Secondary | ICD-10-CM | POA: Diagnosis not present

## 2022-02-05 NOTE — Progress Notes (Deleted)
Cardiology Office Note  Date: 02/05/2022   ID: Alicia Nash, DOB 1940/05/16, MRN [id number]  PCP:  [name], MD  Cardiologist:  Rozann Lesches, MD Electrophysiologist:  Thompson Grayer, MD   No chief complaint on file.   History of Present Illness: Alicia Nash is an 81 y.o. female last seen in November 2022.  [employer] pacemaker in place with follow-up by EP.  Device check in August showed 4.2% AF burden.  Follow-up echocardiogram in June revealed normal LVEF at 60 to 65%, normal RV contraction and estimated PASP, mild mitral and aortic regurgitation.  Past Medical History:  Diagnosis Date   Arthritis    Cellulitis    Right leg   Hyperlipidemia    Hypertension    Lobular carcinoma of right breast ([place] [facility]) 09/27/2014   Malignant neoplasm of upper-outer quadrant of right female breast ([facility]) 09/27/2014   Paroxysmal atrial fibrillation (HCC)    Pulmonary hypertension ([facility])    Thoracic ascending aortic aneurysm ([place])    Varicose veins     Past Surgical History:  Procedure Laterality Date   ABDOMINAL HYSTERECTOMY     CHOLECYSTECTOMY     ESOPHAGOGASTRODUODENOSCOPY N/A 01/13/2018   Procedure: ESOPHAGOGASTRODUODENOSCOPY (EGD);  Surgeon: Rogene Houston, MD;  Location: [facility];  Service: Endoscopy;  Laterality: N/A;   FOOT SURGERY     Multiple foot surgeries by Dr. Iona Coach CAPSULE STUDY N/A 08/26/2017   Procedure: GIVENS CAPSULE STUDY;  Surgeon: Rogene Houston, MD;  Location: [facility] ENDO SUITE;  Service: Endoscopy;  Laterality: N/A;  8:30   JOINT REPLACEMENT     bilateral knee by Drs. Sallee Provencal and McGinley   PACEMAKER IMPLANT Left 05/12/2013   MDT Sherril Croon PPM implanted by Dr Dot Lanes for CHB and SSS   SHOULDER SURGERY      Current Outpatient Medications  Medication Sig Dispense Refill   acetaminophen (TYLENOL) 500 MG tablet Take 500 mg by mouth daily as needed for mild pain or moderate pain.      albuterol (VENTOLIN HFA) 108 (90 Base) MCG/ACT inhaler       anastrozole (ARIMIDEX) 1 MG tablet TAKE ONE TABLET BY MOUTH ONCE DAILY 30 tablet 5   apixaban (ELIQUIS) 2.5 MG TABS tablet Take 1 tablet (2.5 mg total) by mouth 2 (two) times daily. 60 tablet 1   carvedilol (COREG) 6.25 MG tablet      cetirizine (ZYRTEC) 10 MG tablet Take 10 mg by mouth daily as needed for allergies.  0   citalopram (CELEXA) 20 MG tablet Take 20 mg by mouth every morning.      Colchicine 0.6 MG CAPS Take 1 capsule by mouth 2 (two) times daily.     Cranberry 400 MG CAPS Take 1 tablet by mouth every morning.      diazepam (VALIUM) 5 MG tablet Take 1 tablet 2 (two) times daily by mouth.     docusate sodium (COLACE) 100 MG capsule Take 100 mg by mouth every morning.     furosemide (LASIX) 40 MG tablet Take 1 tablet by mouth every morning.      gemfibrozil (LOPID) 600 MG tablet Take 1 tablet by mouth every morning.      hydrALAZINE (APRESOLINE) 25 MG tablet Take 25 mg by mouth 2 (two) times daily.     ipratropium-albuterol (DUONEB) 0.5-2.5 (3) MG/3ML SOLN Take 3 mLs by nebulization 4 (four) times daily.     Multiple Vitamin (MULTIVITAMIN) tablet Take 1 tablet by mouth every morning.  olmesartan (BENICAR) 40 MG tablet TAKE ONE TABLET BY MOUTH DAILY 30 tablet 11   pantoprazole (PROTONIX) 40 MG tablet Take by mouth.     PNEUMOVAX 23 25 MCG/0.5ML injection      Promethazine HCl 6.25 MG/5ML SOLN Take 5 mLs by mouth every 6 (six) hours as needed.     rosuvastatin (CRESTOR) 10 MG tablet Take 10 mg by mouth daily.     silver sulfADIAZINE (SILVADENE) 1 % cream Apply topically See admin instructions.     sodium bicarbonate 650 MG tablet Take by mouth.     traMADol (ULTRAM) 50 MG tablet Take 50 mg by mouth every 6 (six) hours as needed.     triamcinolone cream (KENALOG) 0.1 % APPLY A THIN LAYER TO THE AFFECTED AREAS DAILY     ULORIC 40 MG tablet Take 1 tablet by mouth every morning.      verapamil (VERELAN PM) 360 MG 24 hr capsule Take 360 mg by mouth every morning.      vitamin C  (ASCORBIC ACID) 500 MG tablet Take 500 mg by mouth every morning.      Vitamin D, Ergocalciferol, (DRISDOL) 1.25 MG (50000 UNIT) CAPS capsule TAKE ONE CAPSULE BY MOUTH ONCE A WEEK. 12 capsule 2   No current facility-administered medications for this visit.   Facility-Administered Medications Ordered in Other Visits  Medication Dose Route Frequency Provider Last Rate Last Admin   0.9 %  sodium chloride infusion   Intravenous Continuous Derek Jack, MD 20 mL/hr at 05/07/18 1447 New Bag at 05/07/18 1447   Allergies:  Dihydrocodeine and Other   Social History: The patient  reports that she has never smoked. She has never used smokeless tobacco. She reports that she does not drink alcohol and does not use drugs.   Family History: The patient's family history includes Diabetes in her mother; Hypertension in an other family member.   ROS:  Please see the history of present illness. Otherwise, complete review of systems is positive for {NONE DEFAULTED:18576}.  All other systems are reviewed and negative.   Physical Exam: VS:  There were no vitals taken for this visit., BMI There is no height or weight on file to calculate BMI.  Wt Readings from Last 3 Encounters:  10/17/21 185 lb 3.5 oz (84 kg)  03/27/21 174 lb (78.9 kg)  03/08/21 175 lb (79.4 kg)    General: Patient appears comfortable at rest. HEENT: Conjunctiva and lids normal, oropharynx clear with moist mucosa. Neck: Supple, no elevated JVP or carotid bruits, no thyromegaly. Lungs: Clear to auscultation, nonlabored breathing at rest. Cardiac: Regular rate and rhythm, no S3 or significant systolic murmur, no pericardial rub. Abdomen: Soft, nontender, no hepatomegaly, bowel sounds present, no guarding or rebound. Extremities: No pitting edema, distal pulses 2+. Skin: Warm and dry. Musculoskeletal: No kyphosis. Neuropsychiatric: Alert and oriented x3, affect grossly appropriate.  ECG:  An ECG dated 03/08/2021 was personally  reviewed today and demonstrated:  Dual chamber pacing.  Recent Labwork: 01/17/2022: ALT 15; AST 19; BUN 22; Creatinine, Ser 1.42; Hemoglobin 11.4; Platelets 203; Potassium 4.0; Sodium 141   Other Studies Reviewed Today:  Echocardiogram 10/31/2021:  1. Left ventricular ejection fraction, by estimation, is 60 to 65%. The  left ventricle has normal function. The left ventricle has no regional  wall motion abnormalities. There is mild left ventricular hypertrophy.  Left ventricular diastolic parameters  are indeterminate.   2. Right ventricular systolic function is normal. The right ventricular  size is normal. There  is normal pulmonary artery systolic pressure. The  estimated right ventricular systolic pressure is 84.5 mmHg.   3. Left atrial size was moderately dilated.   4. Right atrial size was mildly dilated.   5. The mitral valve is grossly normal. Mild mitral valve regurgitation.   6. The aortic valve is tricuspid. Aortic valve regurgitation is mild.  Aortic regurgitation PHT measures 831 msec.   7. The inferior vena cava is normal in size with greater than 50%  respiratory variability, suggesting right atrial pressure of 3 mmHg.   Assessment and Plan:    Medication Adjustments/Labs and Tests Ordered: Current medicines are reviewed at length with the patient today.  Concerns regarding medicines are outlined above.   Tests Ordered: No orders of the defined types were placed in this encounter.   Medication Changes: No orders of the defined types were placed in this encounter.   Disposition:  Follow up {follow up:15908}  Signed, Satira Sark, MD, [facility] 02/05/2022 10:22 AM    [facility] [place], [place], [place] [address] Phone: [phone]; Fax: [phone]

## 2022-02-06 ENCOUNTER — Ambulatory Visit: Payer: Medicare Other | Admitting: Cardiology

## 2022-02-06 DIAGNOSIS — I480 Paroxysmal atrial fibrillation: Secondary | ICD-10-CM

## 2022-02-12 ENCOUNTER — Encounter: Payer: Self-pay | Admitting: Cardiovascular Disease

## 2022-02-18 ENCOUNTER — Encounter: Payer: Self-pay | Admitting: Cardiovascular Disease

## 2022-02-24 DIAGNOSIS — J40 Bronchitis, not specified as acute or chronic: Secondary | ICD-10-CM | POA: Diagnosis not present

## 2022-03-07 ENCOUNTER — Encounter: Payer: Medicare Other | Admitting: Internal Medicine

## 2022-04-12 NOTE — Progress Notes (Deleted)
Cardiology Office Note:    Date:  04/12/2022   ID:  JYRAH BLYE, DOB January 29, 1941, MRN [id number]  PCP:  [name], MD   [facility] Cardiologist:  Rozann Lesches, MD Electrophysiologist:  Thompson Grayer, MD { Click to update primary MD,subspecialty MD or APP then REFRESH:1}    Referring MD: [name], MD   No chief complaint on file. ***  History of Present Illness:    Alicia Nash is a 81 y.o. female with a hx of ***  PAF CHB, s/p PPM Ascending thoracic aortic aneurysm LLL pulmonary nodule  HTN HLD Hx of right breast cancer Pulmonary HTN Chronic HFpEF  Patient is a 81 year old female with past medical history as mentioned above. [facility] 12/2020 showed EF 60-65%, moderate DD, moderate MR, moderately to severely dilated left atrium, moderate TR, evidence of severe pulmonary hypertension, RVSP around 66 mmHg. CT of chest at [facility] 01/2021 showed 4.1 cm ascending thoracic aortic aneurysm with evidence of 6 mm LLL pulmonary nodule, with findings consistent with pulmonary hypertension.   Former patient of Dr. Bronson Ing and established follow-up with Dr. Domenic Polite on 03/27/2021. Also follows EP and her most recent device interrogation at that time showed a 2.6% AF burden, longest episode lasting ~ 28 hrs. At follow-up visit with Dr. Domenic Polite in 03/2021, she was doing well.  Compliant with medications, stable weights, denied any LE swelling.  Remained active doing yard work.  Was following Dr. Melvyn Novas.  Dr. Domenic Polite recommended follow-up echocardiogram at next office visit with consideration for repeat CT of chest in 01/2022 for re-evaluation of ascending thoracic aortic aneurysm.   Today she presents for 6 month follow-up. She states ***   Past Medical History:  Diagnosis Date   Arthritis    Cellulitis    Right leg   Hyperlipidemia    Hypertension    Lobular carcinoma of right breast ([facility]) 09/27/2014   Malignant neoplasm of upper-outer  quadrant of right female breast ([place]) 09/27/2014   Paroxysmal atrial fibrillation (HCC)    Pulmonary hypertension ([place])    Thoracic ascending aortic aneurysm (HCC)    Varicose veins     Past Surgical History:  Procedure Laterality Date   ABDOMINAL HYSTERECTOMY     CHOLECYSTECTOMY     ESOPHAGOGASTRODUODENOSCOPY N/A 01/13/2018   Procedure: ESOPHAGOGASTRODUODENOSCOPY (EGD);  Surgeon: Rogene Houston, MD;  Location[facility];  Service: Endoscopy;  Laterality: N/A;   FOOT SURGERY     Multiple foot surgeries by Dr. Iona Coach CAPSULE STUDY N/A 08/26/2017   Procedure: GIVENS CAPSULE STUDY;  Surgeon: Rogene Houston, MD;  Location: [facility];  Service: Endoscopy;  Laterality: N/A;  8:30   JOINT REPLACEMENT     bilateral knee by Drs. McGee and McGinley   PACEMAKER IMPLANT Left 05/12/2013   MDT Sherril Croon PPM implanted by Dr Dot Lanes for CHB and SSS   SHOULDER SURGERY      Current Medications: No outpatient medications have been marked as taking for the 04/14/22 encounter (Appointment) with Finis Bud, NP.     Allergies:   Dihydrocodeine and Other   Social History   Socioeconomic History   Marital status: Divorced    Spouse name: Not on file   Number of children: Not on file   Years of education: Not on file   Highest education level: Not on file  Occupational History   Not on file  Tobacco Use   Smoking status: Never   Smokeless  tobacco: Never  Vaping Use   Vaping Use: Never used  Substance and Sexual Activity   Alcohol use: No   Drug use: No   Sexual activity: Not on file  Other Topics Concern   Not on file  Social History Narrative   Lives in [place]   Divorced   Retired from [employer] Strain: [place]  (03/08/2020)   Overall Financial Resource Strain (CARDIA)    Difficulty of Paying Living Expenses: Not hard at all  Food Insecurity: No Food Insecurity (03/08/2020)   Hunger Vital  Sign    Worried About Running Out of Food in the Last Year: Never true    Ran Out of Food in the Last Year: Never true  Transportation Needs: No Transportation Needs (03/08/2020)   PRAPARE - Hydrologist (Medical): No    Lack of Transportation (Non-Medical): No  Physical Activity: Insufficiently Active (03/08/2020)   Exercise Vital Sign    Days of Exercise per Week: 2 days    Minutes of Exercise per Session: 30 min  Stress: No Stress Concern Present (03/08/2020)   [place]    Feeling of Stress : Not at all  Social Connections: Moderately Isolated (03/08/2020)   Social Connection and Isolation Panel [NHANES]    Frequency of Communication with Friends and Family: More than three times a week    Frequency of Social Gatherings with Friends and Family: More than three times a week    Attends Religious Services: More than 4 times per year    Active Member of [employer] or Organizations: No    Attends Archivist Meetings: Never    Marital Status: Widowed     Family History: The patient's ***family history includes Diabetes in her mother; Hypertension in an other family member.  ROS:   Please see the history of present illness.    *** All other systems reviewed and are negative.  EKGs/Labs/Other Studies Reviewed:    The following studies were reviewed today: ***  EKG:  EKG is *** ordered today.  The ekg ordered today demonstrates ***  Recent Labs: 01/17/2022: ALT 15; BUN 22; Creatinine, Ser 1.42; Hemoglobin 11.4; Platelets 203; Potassium 4.0; Sodium 141  Recent Lipid Panel No results found for: "CHOL", "TRIG", "HDL", "CHOLHDL", "VLDL", "LDLCALC", "LDLDIRECT"   Risk Assessment/Calculations:   {Does this patient have ATRIAL FIBRILLATION?:[phone]}  No BP recorded.  {Refresh Note OR Click here to enter BP  :1}***         Physical Exam:    VS:  There were no vitals taken for this  visit.    Wt Readings from Last 3 Encounters:  10/17/21 185 lb 3.5 oz (84 kg)  03/27/21 174 lb (78.9 kg)  03/08/21 175 lb (79.4 kg)     GEN: *** Well nourished, well developed in no acute distress HEENT: Normal NECK: No JVD; No carotid bruits LYMPHATICS: No lymphadenopathy CARDIAC: ***RRR, no murmurs, rubs, gallops RESPIRATORY:  Clear to auscultation without rales, wheezing or rhonchi  ABDOMEN: Soft, non-tender, non-distended MUSCULOSKELETAL:  No edema; No deformity  SKIN: Warm and dry NEUROLOGIC:  Alert and oriented x 3 PSYCHIATRIC:  Normal affect   ASSESSMENT:    No diagnosis found. PLAN:    In order of problems listed above:  ***      {Are you ordering a CV Procedure (e.g. stress test, cath, DCCV, TEE,  etc)?   Press F2        :[id number]    Medication Adjustments/Labs and Tests Ordered: Current medicines are reviewed at length with the patient today.  Concerns regarding medicines are outlined above.  No orders of the defined types were placed in this encounter.  No orders of the defined types were placed in this encounter.   There are no Patient Instructions on file for this visit.   SignedFinis Bud, NP  04/12/2022 12:08 PM    [place]

## 2022-04-14 ENCOUNTER — Ambulatory Visit: Payer: Medicare Other | Admitting: Nurse Practitioner

## 2022-04-15 ENCOUNTER — Encounter: Payer: Self-pay | Admitting: Nurse Practitioner

## 2022-04-23 DIAGNOSIS — R42 Dizziness and giddiness: Secondary | ICD-10-CM | POA: Diagnosis not present

## 2022-04-23 DIAGNOSIS — L0889 Other specified local infections of the skin and subcutaneous tissue: Secondary | ICD-10-CM | POA: Diagnosis not present

## 2022-04-24 NOTE — Progress Notes (Deleted)
Cardiology Office Note:    Date:  04/24/2022   ID:  Alicia Nash, DOB [birth year], MRN [id number]  PCP:  [name], MD   [place] [facility] Cardiologist:  Rozann Lesches, MD Electrophysiologist:  Thompson Grayer, MD { Click to update primary MD,subspecialty MD or APP then REFRESH:1}    Referring MD: [name], MD   No chief complaint on file. ***  History of Present Illness:    Alicia Nash is a 81 y.o. female with a hx of ***  PAF CHB, s/p PPM Ascending thoracic aortic aneurysm LLL pulmonary nodule  HTN HLD Hx of right breast cancer Pulmonary HTN Chronic HFpEF  Patient is a 81 year old female with past medical history as mentioned above. TTE at [facility] 12/2020 showed EF 60-65%, moderate DD, moderate MR, moderately to severely dilated left atrium, moderate TR, evidence of severe pulmonary hypertension, RVSP around 66 mmHg. CT of chest at [facility] 01/2021 showed 4.1 cm ascending thoracic aortic aneurysm with evidence of 6 mm LLL pulmonary nodule, with findings consistent with pulmonary hypertension.   Former patient of Dr. Bronson Ing and established follow-up with Dr. Domenic Polite on 03/27/2021. Also follows EP and her most recent device interrogation at that time showed a 2.6% AF burden, longest episode lasting ~ 28 hrs. At follow-up visit with Dr. Domenic Polite in 03/2021, she was doing well.  Compliant with medications, stable weights, denied any LE swelling.  Remained active doing yard work.  Was following Dr. Melvyn Novas.  Dr. Domenic Polite recommended follow-up echocardiogram at next office visit with consideration for repeat CT of chest in 01/2022 for re-evaluation of ascending thoracic aortic aneurysm.   Today she presents for 6 month follow-up. She states ***   Past Medical History:  Diagnosis Date   Arthritis    Cellulitis    Right leg   Hyperlipidemia    Hypertension    Lobular carcinoma of right breast ([place]) 09/27/2014   Malignant neoplasm of upper-outer  quadrant of right female breast ([place]) 09/27/2014   Paroxysmal atrial fibrillation (HCC)    Pulmonary hypertension ([place])    Thoracic ascending aortic aneurysm (HCC)    Varicose veins     Past Surgical History:  Procedure Laterality Date   ABDOMINAL HYSTERECTOMY     CHOLECYSTECTOMY     ESOPHAGOGASTRODUODENOSCOPY N/A 01/13/2018   Procedure: ESOPHAGOGASTRODUODENOSCOPY (EGD);  Surgeon: Rogene Houston, MD;  [facility];  Service: Endoscopy;  Laterality: N/A;   FOOT SURGERY     Multiple foot surgeries by Dr. Iona Coach CAPSULE STUDY N/A 08/26/2017   Procedure: GIVENS CAPSULE STUDY;  Surgeon: Rogene Houston, MD;  Location: [facility];  Service: Endoscopy;  Laterality: N/A;  8:30   JOINT REPLACEMENT     bilateral knee by Drs. McGee and McGinley   PACEMAKER IMPLANT Left 05/12/2013   MDT Sherril Croon PPM implanted by Dr Dot Lanes for CHB and SSS   SHOULDER SURGERY      Current Medications: No outpatient medications have been marked as taking for the 05/08/22 encounter (Appointment) with Finis Bud, NP.     Allergies:   Dihydrocodeine and Other   Social History   Socioeconomic History   Marital status: Divorced    Spouse name: Not on file   Number of children: Not on file   Years of education: Not on file   Highest education level: Not on file  Occupational History   Not on file  Tobacco Use   Smoking status: Never   Smokeless  tobacco: Never  Vaping Use   Vaping Use: Never used  Substance and Sexual Activity   Alcohol use: No   Drug use: No   Sexual activity: Not on file  Other Topics Concern   Not on file  Social History Narrative   Lives in [place]   Divorced   Retired from [employer] Strain: [place]  (03/08/2020)   Overall Financial Resource Strain (CARDIA)    Difficulty of Paying Living Expenses: Not hard at all  Food Insecurity: No Food Insecurity (03/08/2020)   Hunger Vital  Sign    Worried About Running Out of Food in the Last Year: Never true    Ran Out of Food in the Last Year: Never true  Transportation Needs: No Transportation Needs (03/08/2020)   PRAPARE - Hydrologist (Medical): No    Lack of Transportation (Non-Medical): No  Physical Activity: Insufficiently Active (03/08/2020)   Exercise Vital Sign    Days of Exercise per Week: 2 days    Minutes of Exercise per Session: 30 min  Stress: No Stress Concern Present (03/08/2020)   [place]    Feeling of Stress : Not at all  Social Connections: Moderately Isolated (03/08/2020)   Social Connection and Isolation Panel [NHANES]    Frequency of Communication with Friends and Family: More than three times a week    Frequency of Social Gatherings with Friends and Family: More than three times a week    Attends Religious Services: More than 4 times per year    Active Member of [employer] or Organizations: No    Attends Archivist Meetings: Never    Marital Status: Widowed     Family History: The patient's ***family history includes Diabetes in her mother; Hypertension in an other family member.  ROS:   Please see the history of present illness.    *** All other systems reviewed and are negative.  EKGs/Labs/Other Studies Reviewed:    The following studies were reviewed today: ***  EKG:  EKG is *** ordered today.  The ekg ordered today demonstrates ***  Recent Labs: 01/17/2022: ALT 15; BUN 22; Creatinine, Ser 1.42; Hemoglobin 11.4; Platelets 203; Potassium 4.0; Sodium 141  Recent Lipid Panel No results found for: "CHOL", "TRIG", "HDL", "CHOLHDL", "VLDL", "LDLCALC", "LDLDIRECT"   Risk Assessment/Calculations:   {Does this patient have ATRIAL FIBRILLATION?:[phone]}  No BP recorded.  {Refresh Note OR Click here to enter BP  :1}***         Physical Exam:    VS:  There were no vitals taken for this  visit.    Wt Readings from Last 3 Encounters:  10/17/21 185 lb 3.5 oz (84 kg)  03/27/21 174 lb (78.9 kg)  03/08/21 175 lb (79.4 kg)     GEN: *** Well nourished, well developed in no acute distress HEENT: Normal NECK: No JVD; No carotid bruits LYMPHATICS: No lymphadenopathy CARDIAC: ***RRR, no murmurs, rubs, gallops RESPIRATORY:  Clear to auscultation without rales, wheezing or rhonchi  ABDOMEN: Soft, non-tender, non-distended MUSCULOSKELETAL:  No edema; No deformity  SKIN: Warm and dry NEUROLOGIC:  Alert and oriented x 3 PSYCHIATRIC:  Normal affect   ASSESSMENT:    No diagnosis found. PLAN:    In order of problems listed above:  ***      {Are you ordering a CV Procedure (e.g. stress test, cath, DCCV, TEE,  etc)?   Press F2        :[id number]    Medication Adjustments/Labs and Tests Ordered: Current medicines are reviewed at length with the patient today.  Concerns regarding medicines are outlined above.  No orders of the defined types were placed in this encounter.  No orders of the defined types were placed in this encounter.   There are no Patient Instructions on file for this visit.   Signed, Finis Bud, NP  04/24/2022 2:27 PM    [place]

## 2022-05-06 DIAGNOSIS — J40 Bronchitis, not specified as acute or chronic: Secondary | ICD-10-CM | POA: Diagnosis not present

## 2022-05-08 ENCOUNTER — Ambulatory Visit: Payer: Medicare Other | Admitting: Nurse Practitioner

## 2022-05-21 DIAGNOSIS — Z79899 Other long term (current) drug therapy: Secondary | ICD-10-CM | POA: Diagnosis not present

## 2022-05-21 DIAGNOSIS — R5383 Other fatigue: Secondary | ICD-10-CM | POA: Diagnosis not present

## 2022-05-21 DIAGNOSIS — L664 Folliculitis ulerythematosa reticulata: Secondary | ICD-10-CM | POA: Diagnosis not present

## 2022-05-21 DIAGNOSIS — J40 Bronchitis, not specified as acute or chronic: Secondary | ICD-10-CM | POA: Diagnosis not present

## 2022-05-23 ENCOUNTER — Ambulatory Visit: Payer: Medicare Other | Attending: Internal Medicine | Admitting: Cardiovascular Disease

## 2022-05-23 ENCOUNTER — Encounter: Payer: Self-pay | Admitting: Cardiovascular Disease

## 2022-05-23 VITALS — BP 136/66 | HR 66 | Ht 63.0 in | Wt 186.0 lb

## 2022-05-23 DIAGNOSIS — I442 Atrioventricular block, complete: Secondary | ICD-10-CM

## 2022-05-23 NOTE — Patient Instructions (Addendum)
Medication Instructions:  Continue all current medications.  Labwork: none  Testing/Procedures: none  Follow-Up: 6 months   Any Other Special Instructions Will Be Listed Below (If Applicable).  If you need a refill on your cardiac medications before your next appointment, please call your pharmacy.  

## 2022-05-23 NOTE — Progress Notes (Signed)
PCP: [name], MD Primary Cardiologist: Dr Domenic Polite Primary EP:  Dr Barton Dubois is a 82 y.o. female who presents today for routine electrophysiology followup.  Since last being seen in our clinic, the patient reports doing reasonably well.     Today, she denies symptoms of palpitations, chest pain,  dizziness, presyncope, or syncope.  The patient is otherwise without complaint today.   Past Medical History:  Diagnosis Date   Arthritis    Cellulitis    Right leg   Hyperlipidemia    Hypertension    Lobular carcinoma of right breast ([place]) 09/27/2014   Malignant neoplasm of upper-outer quadrant of right female breast ([place]) 09/27/2014   Paroxysmal atrial fibrillation (HCC)    Pulmonary hypertension ([place])    Thoracic ascending aortic aneurysm ([place])    Varicose veins    Past Surgical History:  Procedure Laterality Date   ABDOMINAL HYSTERECTOMY     CHOLECYSTECTOMY     ESOPHAGOGASTRODUODENOSCOPY N/A 01/13/2018   Procedure: ESOPHAGOGASTRODUODENOSCOPY (EGD);  Surgeon: Rogene Houston, MD;  [facility];  Service: Endoscopy;  Laterality: N/A;   FOOT SURGERY     Multiple foot surgeries by Dr. Iona Coach CAPSULE STUDY N/A 08/26/2017   Procedure: GIVENS CAPSULE STUDY;  Surgeon: Rogene Houston, MD;  [facility];  Service: Endoscopy;  Laterality: N/A;  8:30   JOINT REPLACEMENT     bilateral knee by Drs. McGee and McGinley   PACEMAKER IMPLANT Left 05/12/2013   MDT Sherril Croon PPM implanted by Dr Dot Lanes for CHB and SSS   SHOULDER SURGERY      ROS- all systems are reviewed and negative except as per HPI above  Current Outpatient Medications  Medication Sig Dispense Refill   acetaminophen (TYLENOL) 500 MG tablet Take 500 mg by mouth daily as needed for mild pain or moderate pain.      albuterol (VENTOLIN HFA) 108 (90 Base) MCG/ACT inhaler      anastrozole (ARIMIDEX) 1 MG tablet TAKE ONE TABLET BY MOUTH ONCE DAILY 30 tablet 5   apixaban (ELIQUIS)  2.5 MG TABS tablet Take 1 tablet (2.5 mg total) by mouth 2 (two) times daily. 60 tablet 1   carvedilol (COREG) 6.25 MG tablet      cetirizine (ZYRTEC) 10 MG tablet Take 10 mg by mouth daily as needed for allergies.  0   citalopram (CELEXA) 20 MG tablet Take 20 mg by mouth every morning.      Colchicine 0.6 MG CAPS Take 1 capsule by mouth 2 (two) times daily.     Cranberry 400 MG CAPS Take 1 tablet by mouth every morning.      diazepam (VALIUM) 5 MG tablet Take 1 tablet 2 (two) times daily by mouth.     docusate sodium (COLACE) 100 MG capsule Take 100 mg by mouth every morning.     furosemide (LASIX) 40 MG tablet Take 1 tablet by mouth every morning.      gemfibrozil (LOPID) 600 MG tablet Take 1 tablet by mouth every morning.      hydrALAZINE (APRESOLINE) 25 MG tablet Take 25 mg by mouth 2 (two) times daily.     ipratropium-albuterol (DUONEB) 0.5-2.5 (3) MG/3ML SOLN Take 3 mLs by nebulization 4 (four) times daily.     Multiple Vitamin (MULTIVITAMIN) tablet Take 1 tablet by mouth every morning.      olmesartan (BENICAR) 40 MG tablet TAKE ONE TABLET BY MOUTH DAILY 30 tablet 11  pantoprazole (PROTONIX) 40 MG tablet Take by mouth.     PNEUMOVAX 23 25 MCG/0.5ML injection      Promethazine HCl 6.25 MG/5ML SOLN Take 5 mLs by mouth every 6 (six) hours as needed.     rosuvastatin (CRESTOR) 10 MG tablet Take 10 mg by mouth daily.     silver sulfADIAZINE (SILVADENE) 1 % cream Apply topically See admin instructions.     sodium bicarbonate 650 MG tablet Take by mouth.     traMADol (ULTRAM) 50 MG tablet Take 50 mg by mouth every 6 (six) hours as needed.     triamcinolone cream (KENALOG) 0.1 % APPLY A THIN LAYER TO THE AFFECTED AREAS DAILY     ULORIC 40 MG tablet Take 1 tablet by mouth every morning.      verapamil (VERELAN PM) 360 MG 24 hr capsule Take 360 mg by mouth every morning.      vitamin C (ASCORBIC ACID) 500 MG tablet Take 500 mg by mouth every morning.      Vitamin D, Ergocalciferol, (DRISDOL)  1.25 MG (50000 UNIT) CAPS capsule TAKE ONE CAPSULE BY MOUTH ONCE A WEEK. 12 capsule 2   No current facility-administered medications for this visit.   Facility-Administered Medications Ordered in Other Visits  Medication Dose Route Frequency Provider Last Rate Last Admin   0.9 %  sodium chloride infusion   Intravenous Continuous Derek Jack, MD 20 mL/hr at 05/07/18 1447 New Bag at 05/07/18 1447    Physical Exam: There were no vitals filed for this visit.   Gen: Appears comfortable, well-nourished CV: RRR, no dependent edema The device site is normal -- no tenderness, edema, drainage, redness, threatened erosion. Pulm: breathing easily  Pacemaker interrogation- reviewed in detail today,  See PACEART report  ekg tracing ordered today is personally reviewed and shows AV paced  Echo 01/01/21 shows EF 60%, pulmonary htn, moderate to severe LA enlargement, grade II diastolic dysfunction  Echo 10/2021 shows EF 60-65%, normal RVSP  Assessment and Plan:  1. Symptomatic sinus bradycardia and complete heart block Normal pacemaker function See Pace Art report No changes today she is device dependant today  2. Paroxymal atrial fibrillation  [place] 2% (previously <1%) Continue eliquis  3. HTN Stable No change required today  4. CHFpEF Echo 6/23 reviewed - EF normal, RVSP normal range now - pt missed follow-up with Dr. Domenic Polite; will have her reschedule  5. Medtronic dual chamber pacemaker -- normal function. Approaching RRT. Will arrange monthly remote checks. I discussed the procedure with her today. I explained the risks including infection, bleeding, damage to the existing device.  Return in a year  Melida Quitter, MD 05/23/2022 3:25 PM

## 2022-05-26 ENCOUNTER — Telehealth: Payer: Self-pay | Admitting: Nurse Practitioner

## 2022-05-26 ENCOUNTER — Encounter: Payer: Self-pay | Admitting: Nurse Practitioner

## 2022-05-26 ENCOUNTER — Ambulatory Visit: Payer: Medicare Other | Attending: Nurse Practitioner | Admitting: Nurse Practitioner

## 2022-05-26 VITALS — BP 142/83 | HR 65 | Ht 63.0 in | Wt 188.8 lb

## 2022-05-26 DIAGNOSIS — I10 Essential (primary) hypertension: Secondary | ICD-10-CM | POA: Diagnosis not present

## 2022-05-26 DIAGNOSIS — I5032 Chronic diastolic (congestive) heart failure: Secondary | ICD-10-CM | POA: Diagnosis not present

## 2022-05-26 DIAGNOSIS — Z950 Presence of cardiac pacemaker: Secondary | ICD-10-CM | POA: Diagnosis not present

## 2022-05-26 DIAGNOSIS — I77810 Thoracic aortic ectasia: Secondary | ICD-10-CM

## 2022-05-26 DIAGNOSIS — I442 Atrioventricular block, complete: Secondary | ICD-10-CM | POA: Diagnosis not present

## 2022-05-26 DIAGNOSIS — I480 Paroxysmal atrial fibrillation: Secondary | ICD-10-CM | POA: Diagnosis not present

## 2022-05-26 DIAGNOSIS — Z7901 Long term (current) use of anticoagulants: Secondary | ICD-10-CM | POA: Diagnosis not present

## 2022-05-26 IMAGING — MG MM DIGITAL SCREENING BILAT W/ TOMO AND CAD
6 of 10 series · 6 of 30 positions shown · non-contrast
Comparison: Previous exam(s).

CLINICAL DATA: Screening.

EXAM:
DIGITAL SCREENING BILATERAL MAMMOGRAM WITH TOMOSYNTHESIS AND CAD
TECHNIQUE: Bilateral screening digital craniocaudal and mediolateral oblique
mammograms were obtained. Bilateral screening digital breast
tomosynthesis was performed. The images were evaluated with
computer-aided detection.

[L CC synth-2D]
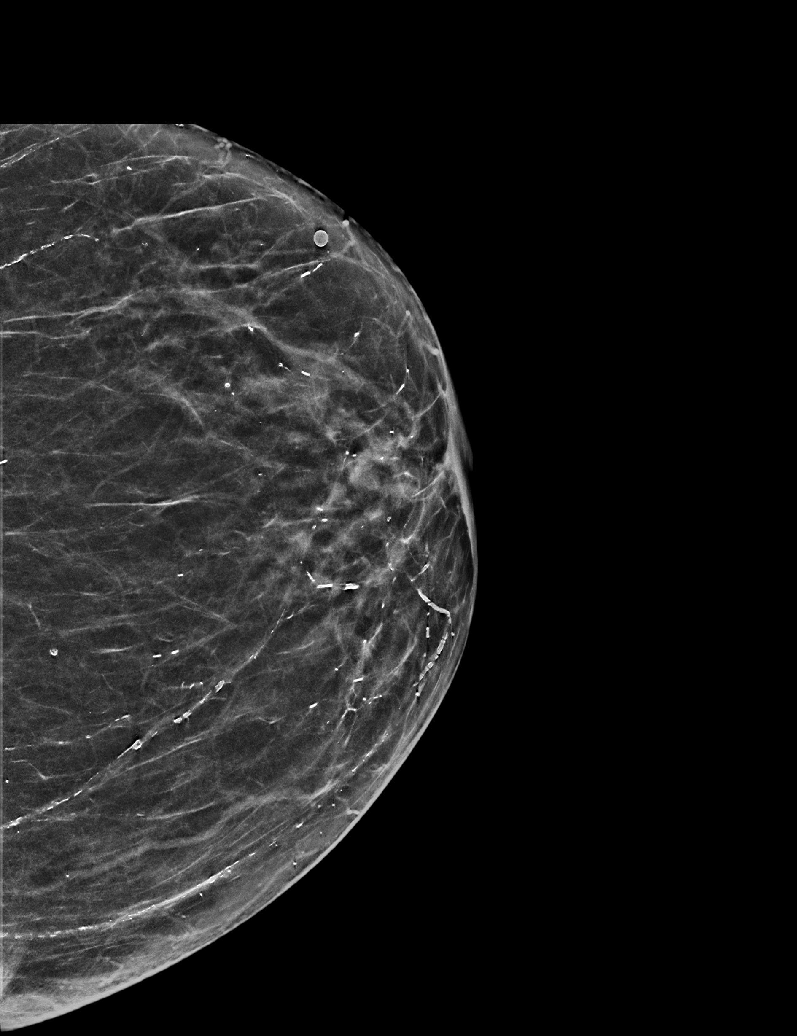

[R MLO synth-2D (1 of 2)]
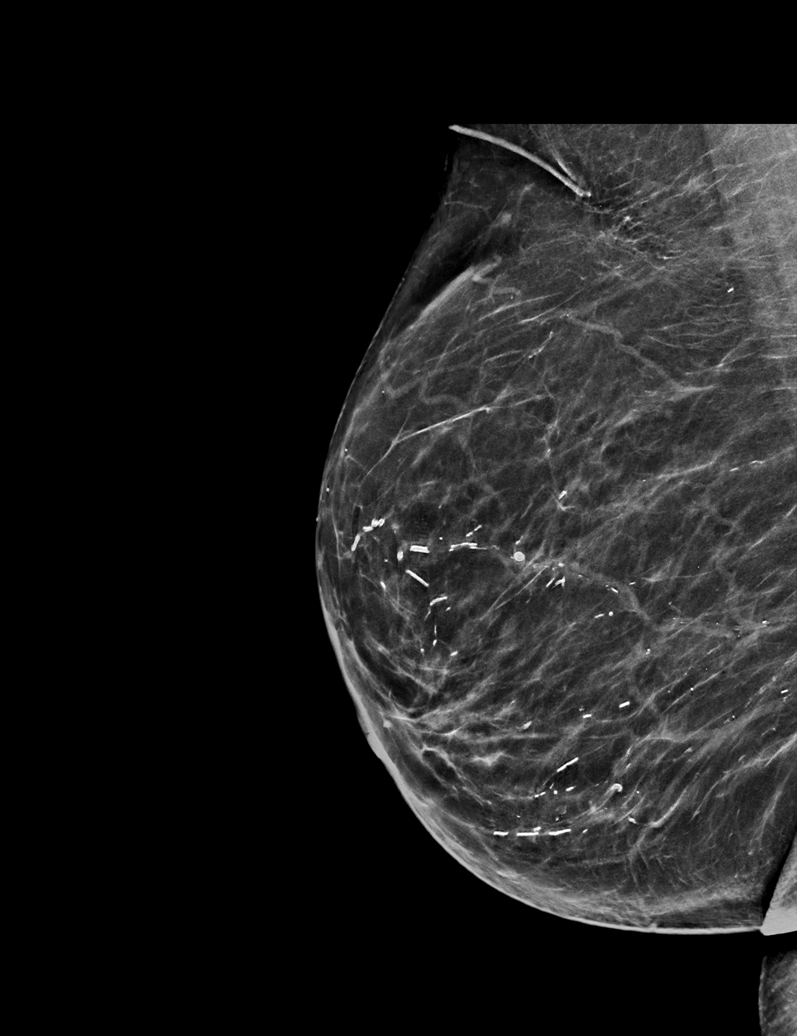

[R MLO synth-2D (2 of 2)]
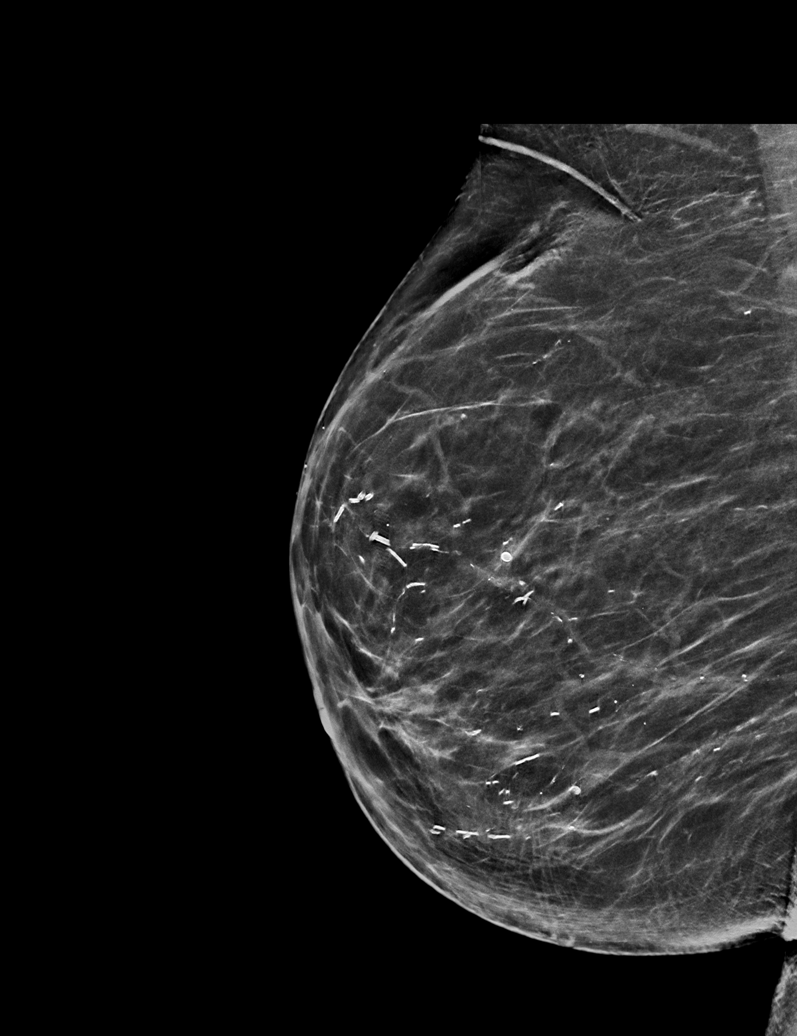

[L MLO synth-2D]
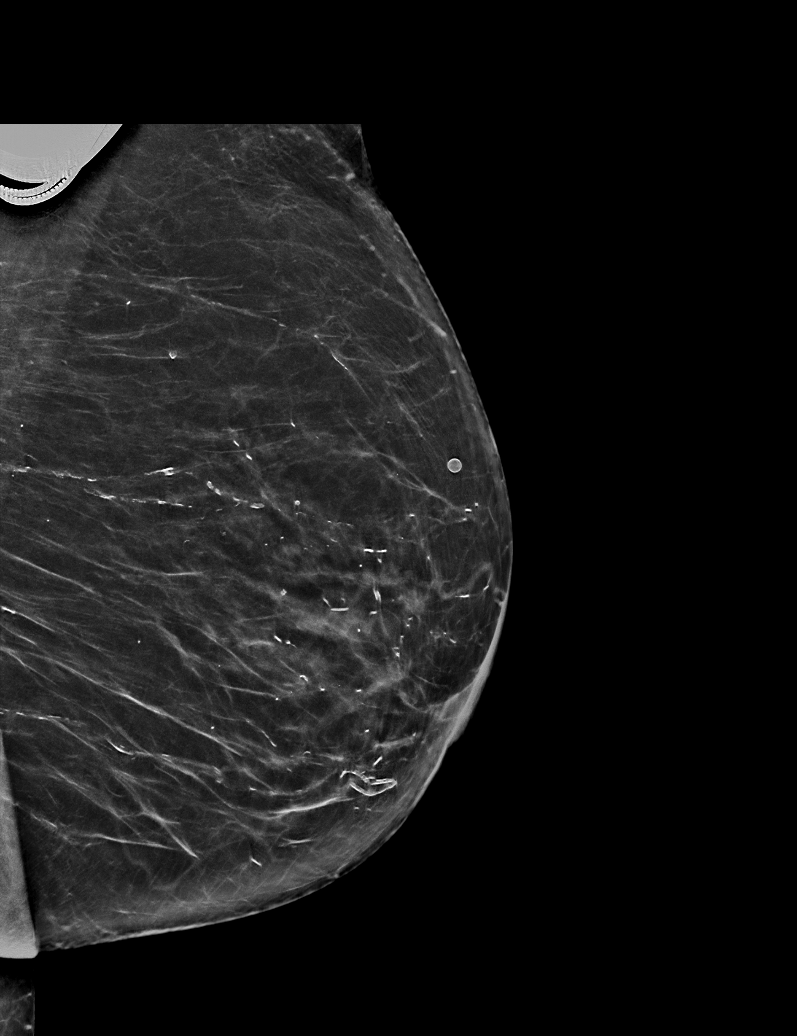

[R CC synth-2D]
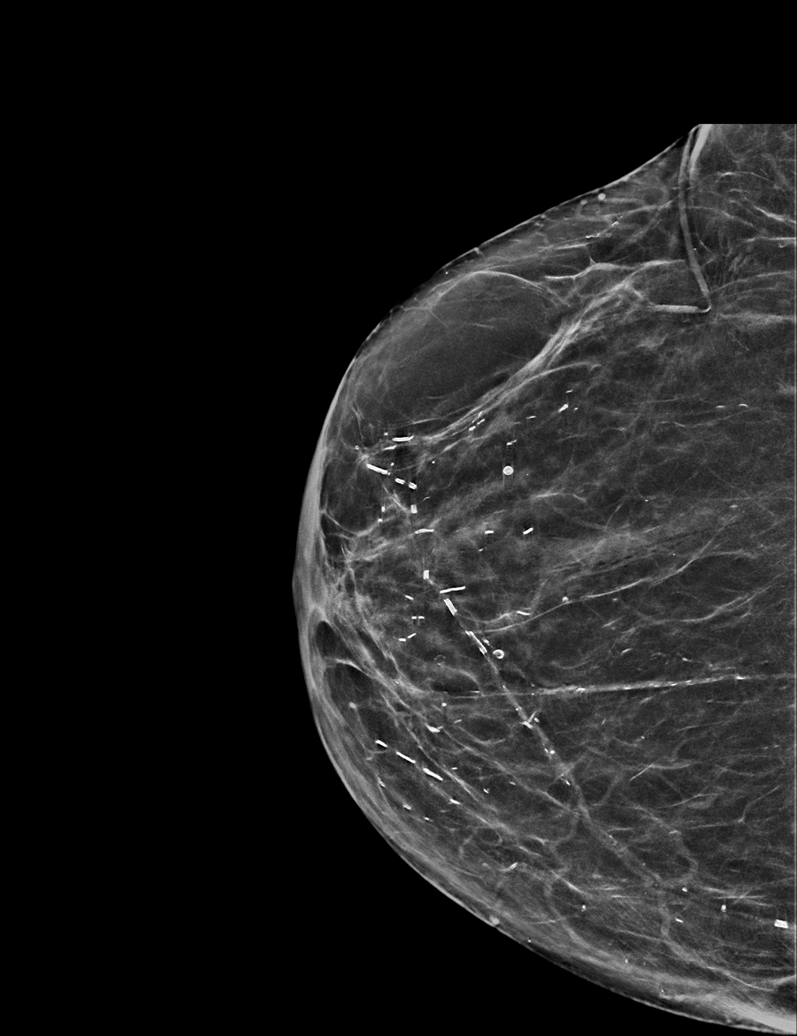

[R MLO tomo · tomo slice 31/61.0]
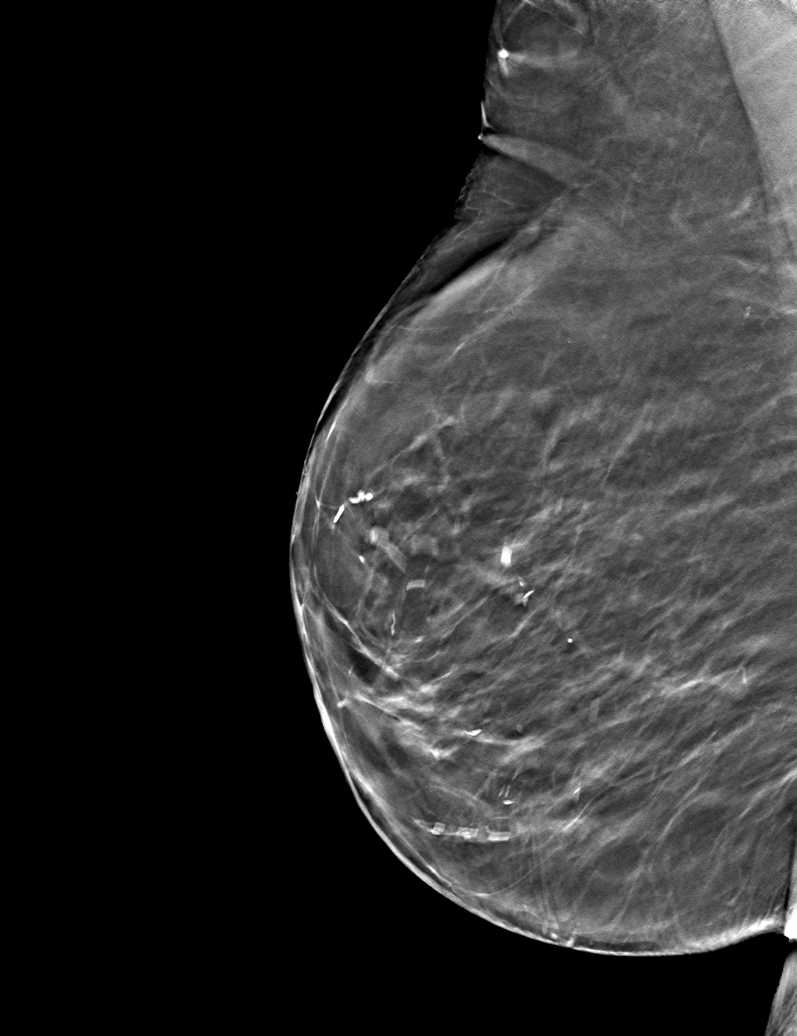

[6 of 30 positions shown; findings below may reference images not displayed]

ACR Breast Density Category b: There are scattered areas of
fibroglandular density.
FINDINGS: There are no findings suspicious for malignancy.
IMPRESSION: No mammographic evidence of malignancy. A result letter of this
screening mammogram will be mailed directly to the patient.

RECOMMENDATION:
Screening mammogram in one year. (Code:51-O-LD2)

BI-RADS CATEGORY  1: Negative.

## 2022-05-26 MED ORDER — HYDRALAZINE HCL 50 MG PO TABS: 50.0000 mg | ORAL_TABLET | Freq: Two times a day (BID) | ORAL | 3 refills | Status: DC

## 2022-05-26 NOTE — Patient Instructions (Addendum)
Medication Instructions:  Your physician has recommended you make the following change in your medication:  -Increase Hydralazine to 50 mg tablets twice daily   Labwork: None  Testing/Procedures: Non-Cardiac CT scanning, (CAT scanning), is a noninvasive, special x-ray that produces cross-sectional images of the body using x-rays and a computer. CT scans help physicians diagnose and treat medical conditions. For some CT exams, a contrast material is used to enhance visibility in the area of the body being studied. CT scans provide greater clarity and reveal more details than regular x-ray exams.   Follow-Up: Follow up with Dr. Domenic Polite in 6 months.   Any Other Special Instructions Will Be Listed Below (If Applicable).  [place] BP Cuff   If you need a refill on your cardiac medications before your next appointment, please call your pharmacy.   Your physician has requested that you regularly monitor and record your blood pressure readings at home. Please use the same machine at the same time of day to check your readings and record them to bring to your follow-up visit.

## 2022-05-26 NOTE — Progress Notes (Unsigned)
Cardiology Office Note:    Date: 05/26/2022  ID:  Alicia Nash, DOB 12-Jun-1940, MRN [id number]  PCP:  [name], MD   [place] [facility] Cardiologist:  Rozann Lesches, MD Electrophysiologist:  Melida Quitter, MD     Referring MD: [name], MD   CC: Here for 6 month follow-up  History of Present Illness:    Alicia Nash is a 82 y.o. female with a hx of the following:   PAF CHB, s/p PPM Ascending thoracic aortic aneurysm LLL pulmonary nodule  HTN HLD Hx of right breast cancer Pulmonary HTN Chronic HFpEF CKD   Patient is a 82 year old female with past medical history as mentioned above. TTE at [facility] 12/2020 showed EF 60-65%, moderate DD, moderate MR, moderately to severely dilated left atrium, moderate TR, evidence of severe pulmonary hypertension, RVSP around 66 mmHg. CT of chest at [facility] 01/2021 showed 4.1 cm ascending thoracic aortic aneurysm with evidence of 6 mm LLL pulmonary nodule, with findings consistent with pulmonary hypertension.  Former patient of Dr. Bronson Ing and established follow-up with Dr. Domenic Polite on 03/27/2021. Also follows EP and her most recent device interrogation at that time showed a 2.6% AF burden, longest episode lasting ~ 28 hrs. At follow-up visit with Dr. Domenic Polite in 03/2021, she was doing well.  Compliant with medications, stable weights, denied any LE swelling.  Remained active doing yard work.  Was following Dr. Melvyn Novas.  Dr. Domenic Polite recommended follow-up echocardiogram at next office visit with consideration for repeat CT of chest in 01/2022 for re-evaluation of ascending thoracic aortic aneurysm.   Today she presents for 6 month follow-up. She states she is doing well from a cardiac perspective. However, her BP has been elevated recently. Denies any chest pain, shortness of breath, palpitations, syncope, presyncope, dizziness, orthopnea, PND, swelling or significant weight changes, acute bleeding, or claudication.  Patient is independent and active at home.   SH: Works a part time job. Has 10 children, 12 grandchildren.  Past Medical History:  Diagnosis Date   Arthritis    Cellulitis    Right leg   Hyperlipidemia    Hypertension    Lobular carcinoma of right breast ([place]) 09/27/2014   Malignant neoplasm of upper-outer quadrant of right female breast ([place]) 09/27/2014   Paroxysmal atrial fibrillation (HCC)    Pulmonary hypertension ([place])    Thoracic ascending aortic aneurysm ([place])    Varicose veins     Past Surgical History:  Procedure Laterality Date   ABDOMINAL HYSTERECTOMY     CHOLECYSTECTOMY     ESOPHAGOGASTRODUODENOSCOPY N/A 01/13/2018   Procedure: ESOPHAGOGASTRODUODENOSCOPY (EGD);  Surgeon: Rogene Houston, MD;  Location: [facility];  Service: Endoscopy;  Laterality: N/A;   FOOT SURGERY     Multiple foot surgeries by Dr. Iona Coach CAPSULE STUDY N/A 08/26/2017   Procedure: GIVENS CAPSULE STUDY;  Surgeon: Rogene Houston, MD;  Location: [facility];  Service: Endoscopy;  Laterality: N/A;  8:30   JOINT REPLACEMENT     bilateral knee by Drs. Sallee Provencal and McGinley   PACEMAKER IMPLANT Left 05/12/2013   MDT Sherril Croon PPM implanted by Dr Dot Lanes for CHB and SSS   SHOULDER SURGERY      Current Medications: Current Meds  Medication Sig   acetaminophen (TYLENOL) 500 MG tablet Take 500 mg by mouth daily as needed for mild pain or moderate pain.    albuterol (VENTOLIN HFA) 108 (90 Base) MCG/ACT inhaler    anastrozole (ARIMIDEX) 1 MG  tablet TAKE ONE TABLET BY MOUTH ONCE DAILY   apixaban (ELIQUIS) 2.5 MG TABS tablet Take 1 tablet (2.5 mg total) by mouth 2 (two) times daily.   carvedilol (COREG) 6.25 MG tablet    cetirizine (ZYRTEC) 10 MG tablet Take 10 mg by mouth daily as needed for allergies.   citalopram (CELEXA) 20 MG tablet Take 20 mg by mouth every morning.    Colchicine 0.6 MG CAPS Take 1 capsule by mouth 2 (two) times daily.   Cranberry 400 MG CAPS Take 1 tablet by mouth every  morning.    diazepam (VALIUM) 5 MG tablet Take 1 tablet 2 (two) times daily by mouth.   docusate sodium (COLACE) 100 MG capsule Take 100 mg by mouth every morning.   furosemide (LASIX) 40 MG tablet Take 1 tablet by mouth every morning.    gemfibrozil (LOPID) 600 MG tablet Take 1 tablet by mouth every morning.    ipratropium-albuterol (DUONEB) 0.5-2.5 (3) MG/3ML SOLN Take 3 mLs by nebulization 4 (four) times daily.   Multiple Vitamin (MULTIVITAMIN) tablet Take 1 tablet by mouth every morning.    olmesartan (BENICAR) 40 MG tablet TAKE ONE TABLET BY MOUTH DAILY   pantoprazole (PROTONIX) 40 MG tablet Take by mouth.   PNEUMOVAX 23 25 MCG/0.5ML injection    Promethazine HCl 6.25 MG/5ML SOLN Take 5 mLs by mouth every 6 (six) hours as needed.   rosuvastatin (CRESTOR) 10 MG tablet Take 10 mg by mouth daily.   silver sulfADIAZINE (SILVADENE) 1 % cream Apply topically See admin instructions.   sodium bicarbonate 650 MG tablet Take by mouth.   traMADol (ULTRAM) 50 MG tablet Take 50 mg by mouth every 6 (six) hours as needed.   triamcinolone cream (KENALOG) 0.1 % APPLY A THIN LAYER TO THE AFFECTED AREAS DAILY   ULORIC 40 MG tablet Take 1 tablet by mouth every morning.    verapamil (VERELAN PM) 360 MG 24 hr capsule Take 360 mg by mouth every morning.    vitamin C (ASCORBIC ACID) 500 MG tablet Take 500 mg by mouth every morning.    Vitamin D, Ergocalciferol, (DRISDOL) 1.25 MG (50000 UNIT) CAPS capsule TAKE ONE CAPSULE BY MOUTH ONCE A WEEK.   hydrALAZINE (APRESOLINE) 25 MG tablet Take 25 mg by mouth 2 (two) times daily.     Allergies:   Dihydrocodeine and Other   Social History   Socioeconomic History   Marital status: Divorced    Spouse name: Not on file   Number of children: Not on file   Years of education: Not on file   Highest education level: Not on file  Occupational History   Not on file  Tobacco Use   Smoking status: Never   Smokeless tobacco: Never  Vaping Use   Vaping Use: Never used   Substance and Sexual Activity   Alcohol use: No   Drug use: No   Sexual activity: Not on file  Other Topics Concern   Not on file  Social History Narrative   Lives in [place]   Divorced   Retired from [employer] Strain: [place]  (03/08/2020)   Overall Financial Resource Strain (CARDIA)    Difficulty of Paying Living Expenses: Not hard at all  Food Insecurity: No Food Insecurity (03/08/2020)   Hunger Vital Sign    Worried About Running Out of Food in the Last Year: Never true    [place] in  the Last Year: Never true  Transportation Needs: No Transportation Needs (03/08/2020)   PRAPARE - Hydrologist (Medical): No    Lack of Transportation (Non-Medical): No  Physical Activity: Insufficiently Active (03/08/2020)   Exercise Vital Sign    Days of Exercise per Week: 2 days    Minutes of Exercise per Session: 30 min  Stress: No Stress Concern Present (03/08/2020)   [place]    Feeling of Stress : Not at all  Social Connections: Moderately Isolated (03/08/2020)   Social Connection and Isolation Panel [NHANES]    Frequency of Communication with Friends and Family: More than three times a week    Frequency of Social Gatherings with Friends and Family: More than three times a week    Attends Religious Services: More than 4 times per year    Active Member of [employer] or Organizations: No    Attends Archivist Meetings: Never    Marital Status: Widowed     Family History: The patient's family history includes Diabetes in her mother; Hypertension in an other family member.  ROS:   Review of Systems  Constitutional: Negative.   HENT: Negative.    Eyes: Negative.   Respiratory: Negative.    Cardiovascular: Negative.   Gastrointestinal: Negative.   Genitourinary: Negative.   Musculoskeletal: Negative.    Skin: Negative.   Neurological: Negative.   Endo/Heme/Allergies: Negative.   Psychiatric/Behavioral:  Positive for memory loss. Negative for depression, hallucinations, substance abuse and suicidal ideas. The patient is not nervous/anxious and does not have insomnia.     Please see the history of present illness.    All other systems reviewed and are negative.  EKGs/Labs/Other Studies Reviewed:    The following studies were reviewed today:   EKG:  EKG is not ordered today.     Echocardiogram on 10/31/2021:  1. Left ventricular ejection fraction, by estimation, is 60 to 65%. The  left ventricle has normal function. The left ventricle has no regional  wall motion abnormalities. There is mild left ventricular hypertrophy.  Left ventricular diastolic parameters  are indeterminate.   2. Right ventricular systolic function is normal. The right ventricular  size is normal. There is normal pulmonary artery systolic pressure. The  estimated right ventricular systolic pressure is 29.9 mmHg.   3. Left atrial size was moderately dilated.   4. Right atrial size was mildly dilated.   5. The mitral valve is grossly normal. Mild mitral valve regurgitation.   6. The aortic valve is tricuspid. Aortic valve regurgitation is mild.  Aortic regurgitation PHT measures 831 msec.   7. The inferior vena cava is normal in size with greater than 50%  respiratory variability, suggesting right atrial pressure of 3 mmHg.   Comparison(s): Prior images unable to be directly viewed.  Recent Labs: 01/17/2022: ALT 15; BUN 22; Creatinine, Ser 1.42; Hemoglobin 11.4; Platelets 203; Potassium 4.0; Sodium 141  Recent Lipid Panel No results found for: "CHOL", "TRIG", "HDL", "CHOLHDL", "VLDL", "LDLCALC", "LDLDIRECT"   Risk Assessment/Calculations:    CHA2DS2-VASc Score = 5  This indicates a 7.2% annual risk of stroke. The patient's score is based upon: CHF History: 1 HTN History: 1 Diabetes History: 0 Stroke  History: 0 Vascular Disease History: 0 Age Score: 2 Gender Score: 1    Physical Exam:    VS:  BP (!) 142/83 (BP Location: Left Arm, Patient Position: Sitting, Cuff Size: Normal)   Pulse 65  Ht _0  (1.6 m)   Wt 188 lb 12.8 oz (85.6 kg)   SpO2 93%   BMI 33.44 kg/m     Wt Readings from Last 3 Encounters:  05/26/22 188 lb 12.8 oz (85.6 kg)  05/23/22 186 lb (84.4 kg)  10/17/21 185 lb 3.5 oz (84 kg)     GEN: Obese, 83 y.o. female in no acute distress HEENT: Normal NECK: No JVD; No carotid bruits CARDIAC: S1/S2, RRR, no murmurs, rubs, gallops; 2+ pulses RESPIRATORY:  Clear to auscultation without rales, wheezing or rhonchi  MUSCULOSKELETAL:  No edema; No deformity  SKIN: Warm and dry NEUROLOGIC:  Alert and oriented x 3, occasional forgetfulness PSYCHIATRIC:  Normal affect   ASSESSMENT:    1. Chronic heart failure with preserved ejection fraction (HFpEF) ([place])   2. Essential hypertension, benign   3. PAF (paroxysmal atrial fibrillation) ([place])   4. Chronic anticoagulation   5. Ascending aorta dilatation (HCC)   6. Complete heart block ([place])   7. Pacemaker    PLAN:    In order of problems listed above:  Chronic HFpEF Echo in June 2023 revealed normal EF, no RWMA, mild LVH, indeterminate left ventricular diastolic parameters. Euvolemic and well compensated on exam.  Continue current medication regimen. Will increase hydralazine as mentioned below. Low sodium diet, fluid restriction <2L, and daily weights encouraged. Educated to contact our office for weight gain of 2 lbs overnight or 5 lbs in one week. Heart healthy diet and regular cardiovascular exercise encouraged.   HTN BP on arrival 166/82, repeat BP 142/83.  Does admit to elevated BP readings recently.  Will increase hydralazine to 50 mg twice daily.  Continue rest of current medication regimen. Given BP log and discussed to monitor BP at home at least 2 hours after medications and sitting for 5-10 minutes.  Recommended to obtain OMRON cuff. Heart healthy diet and regular cardiovascular exercise encouraged.   PAF, chronic anticoagulation Denies any tachycardia or palpitations.  CHA2DS2-VASc score is 5.  Last remote device check revealed 4.2% of PAF burden, controlled rates. Chart review revealed  Eliquis was changed to 2.5 mg BID due to hx of recurrent GI bleed. Pt denies any bleeding. Continue Eliquis for stroke prophylaxis.   Ascending thoracic aortic aneurysm Previous CT [facility] at [facility] showed asymptomatic 4.1 cm ascending thoracic aortic aneurysm.  Dr. Domenic Polite recommended repeating CT of chest in 01/2022 for re-evaluation of ascending thoracic aortic aneurysm. Denies any symptoms. Due for repeat imaging. Will arrange non-contrast CT scan of chest for evaluation and d/t kidney function.   CHB, s/p PPM Normal device function in office with Dr. Myles Gip. Approaching RRT. Dr. Myles Gip stated monthly remote checks will be arranged. Continue to follow-up with EP.   6. Disposition: Follow-up with Dr. Domenic Polite in 6 months or sooner if anything changes.  Medication Adjustments/Labs and Tests Ordered: Current medicines are reviewed at length with the patient today.  Concerns regarding medicines are outlined above.  Orders Placed This Encounter  Procedures   CT Chest Wo Contrast   Meds ordered this encounter  Medications   hydrALAZINE (APRESOLINE) 50 MG tablet    Sig: Take 1 tablet (50 mg total) by mouth in the morning and at bedtime.    Dispense:  180 tablet    Refill:  3    Patient Instructions  Medication Instructions:  Your physician has recommended you make the following change in your medication:  -Increase Hydralazine to 50 mg tablets twice daily   Labwork: None  Testing/Procedures: Non-Cardiac CT scanning, (CAT scanning), is a noninvasive, special x-ray that produces cross-sectional images of the body using x-rays and a computer. CT scans help physicians diagnose and treat  medical conditions. For some CT exams, a contrast material is used to enhance visibility in the area of the body being studied. CT scans provide greater clarity and reveal more details than regular x-ray exams.   Follow-Up: Follow up with Dr. Domenic Polite in 6 months.   Any Other Special Instructions Will Be Listed Below (If Applicable).  [place] BP Cuff   If you need a refill on your cardiac medications before your next appointment, please call your pharmacy.   Your physician has requested that you regularly monitor and record your blood pressure readings at home. Please use the same machine at the same time of day to check your readings and record them to bring to your follow-up visit.    Signed, Finis Bud, NP  05/28/2022 [place]

## 2022-05-26 NOTE — Telephone Encounter (Signed)
Checking percert on the following patient for testing scheduled at [facility].   CT CHEST WO CONTRAST    06/13/2022

## 2022-05-28 ENCOUNTER — Encounter: Payer: Self-pay | Admitting: Nurse Practitioner

## 2022-05-29 DIAGNOSIS — Z1211 Encounter for screening for malignant neoplasm of colon: Secondary | ICD-10-CM | POA: Diagnosis not present

## 2022-06-05 DIAGNOSIS — N3000 Acute cystitis without hematuria: Secondary | ICD-10-CM | POA: Diagnosis not present

## 2022-06-06 LAB — CUP PACEART REMOTE DEVICE CHECK
Battery Impedance: 4406 Ohm
Battery Remaining Longevity: 5 mo
Battery Voltage: 2.68 V
Brady Statistic AP VP Percent: 41 %
Brady Statistic AP VS Percent: 0 %
Brady Statistic AS VP Percent: 59 %
Brady Statistic AS VS Percent: 0 %
Date Time Interrogation Session: 20240119154800
Implantable Lead Connection Status: 753985
Implantable Lead Connection Status: 753985
Implantable Lead Implant Date: 20150108
Implantable Lead Implant Date: 20150108
Implantable Lead Location: 753859
Implantable Lead Location: 753860
Implantable Lead Model: 5076
Implantable Lead Model: 5076
Implantable Pulse Generator Implant Date: 20150108
Lead Channel Impedance Value: 429 Ohm
Lead Channel Impedance Value: 431 Ohm
Lead Channel Pacing Threshold Amplitude: 0.5 V
Lead Channel Pacing Threshold Amplitude: 0.625 V
Lead Channel Pacing Threshold Amplitude: 1.25 V
Lead Channel Pacing Threshold Amplitude: 1.5 V
Lead Channel Pacing Threshold Pulse Width: 0.4 ms
Lead Channel Pacing Threshold Pulse Width: 0.4 ms
Lead Channel Pacing Threshold Pulse Width: 0.4 ms
Lead Channel Pacing Threshold Pulse Width: 0.4 ms
Lead Channel Sensing Intrinsic Amplitude: 0.5 mV
Lead Channel Setting Pacing Amplitude: 2.5 V
Lead Channel Setting Pacing Amplitude: 3 V
Lead Channel Setting Pacing Pulse Width: 0.4 ms
Lead Channel Setting Sensing Sensitivity: 2 mV
Zone Setting Status: 755011
Zone Setting Status: 755011

## 2022-06-09 ENCOUNTER — Encounter (INDEPENDENT_AMBULATORY_CARE_PROVIDER_SITE_OTHER): Payer: Self-pay | Admitting: *Deleted

## 2022-06-13 ENCOUNTER — Ambulatory Visit (HOSPITAL_COMMUNITY)
Admission: RE | Admit: 2022-06-13 | Discharge: 2022-06-13 | Disposition: A | Discharge status: Home / Self Care | Payer: Medicare Other | Source: Ambulatory Visit | Attending: Nurse Practitioner | Admitting: Nurse Practitioner

## 2022-06-13 DIAGNOSIS — I700 Atherosclerosis of aorta: Secondary | ICD-10-CM | POA: Diagnosis not present

## 2022-06-13 DIAGNOSIS — I77810 Thoracic aortic ectasia: Secondary | ICD-10-CM

## 2022-06-13 DIAGNOSIS — R918 Other nonspecific abnormal finding of lung field: Secondary | ICD-10-CM | POA: Insufficient documentation

## 2022-06-16 ENCOUNTER — Ambulatory Visit (INDEPENDENT_AMBULATORY_CARE_PROVIDER_SITE_OTHER): Payer: Medicare Other

## 2022-06-16 DIAGNOSIS — I442 Atrioventricular block, complete: Secondary | ICD-10-CM | POA: Diagnosis not present

## 2022-06-17 LAB — CUP PACEART REMOTE DEVICE CHECK
Battery Impedance: 4837 Ohm
Battery Remaining Longevity: 3 mo
Battery Voltage: 2.66 V
Brady Statistic AP VP Percent: 37 %
Brady Statistic AP VS Percent: 0 %
Brady Statistic AS VP Percent: 63 %
Brady Statistic AS VS Percent: 0 %
Date Time Interrogation Session: 20240213114559
Implantable Lead Connection Status: 753985
Implantable Lead Connection Status: 753985
Implantable Lead Implant Date: 20150108
Implantable Lead Implant Date: 20150108
Implantable Lead Location: 753859
Implantable Lead Location: 753860
Implantable Lead Model: 5076
Implantable Lead Model: 5076
Implantable Pulse Generator Implant Date: 20150108
Lead Channel Impedance Value: 429 Ohm
Lead Channel Impedance Value: 451 Ohm
Lead Channel Pacing Threshold Amplitude: 0.625 V
Lead Channel Pacing Threshold Amplitude: 1.5 V
Lead Channel Pacing Threshold Pulse Width: 0.4 ms
Lead Channel Pacing Threshold Pulse Width: 0.4 ms
Lead Channel Setting Pacing Amplitude: 2.5 V
Lead Channel Setting Pacing Amplitude: 3 V
Lead Channel Setting Pacing Pulse Width: 0.4 ms
Lead Channel Setting Sensing Sensitivity: 2 mV
Zone Setting Status: 755011
Zone Setting Status: 755011

## 2022-06-26 DIAGNOSIS — G629 Polyneuropathy, unspecified: Secondary | ICD-10-CM | POA: Diagnosis not present

## 2022-06-26 DIAGNOSIS — N3000 Acute cystitis without hematuria: Secondary | ICD-10-CM | POA: Diagnosis not present

## 2022-06-26 DIAGNOSIS — R7303 Prediabetes: Secondary | ICD-10-CM | POA: Diagnosis not present

## 2022-06-26 DIAGNOSIS — Z131 Encounter for screening for diabetes mellitus: Secondary | ICD-10-CM | POA: Diagnosis not present

## 2022-07-01 ENCOUNTER — Telehealth: Payer: Self-pay | Admitting: *Deleted

## 2022-07-01 NOTE — Telephone Encounter (Signed)
-----   Message from Finis Bud, NP sent at 06/23/2022  5:15 PM EST ----- Stable ascending thoracic aortic aneurysm. Recommend annual imaging.   Does have pulmonary nodule, unchanged in lower left lobe, scattered pulmonary nodules measuring up to 2 mm. Recommend 1 year follow-up.   Does have some changes along liver suggestive of cirrhosis - recommend following up with PCP about this.   Aortic atherosclerosis noted.   Finis Bud, AGNP-C

## 2022-07-01 NOTE — Telephone Encounter (Signed)
Laurine Blazer, LPN [id number]  [id number] PM EST Back to Top    Notified, copy to pcp.

## 2022-07-03 DIAGNOSIS — A881 Epidemic vertigo: Secondary | ICD-10-CM | POA: Diagnosis not present

## 2022-07-08 ENCOUNTER — Telehealth (INDEPENDENT_AMBULATORY_CARE_PROVIDER_SITE_OTHER): Payer: Self-pay | Admitting: Gastroenterology

## 2022-07-08 ENCOUNTER — Encounter (INDEPENDENT_AMBULATORY_CARE_PROVIDER_SITE_OTHER): Payer: Self-pay | Admitting: Gastroenterology

## 2022-07-08 ENCOUNTER — Ambulatory Visit (INDEPENDENT_AMBULATORY_CARE_PROVIDER_SITE_OTHER): Payer: Medicare Other | Admitting: Gastroenterology

## 2022-07-08 VITALS — BP 135/73 | HR 63 | Temp 98.6°F | Ht 63.0 in | Wt 188.1 lb

## 2022-07-08 DIAGNOSIS — D508 Other iron deficiency anemias: Secondary | ICD-10-CM | POA: Diagnosis not present

## 2022-07-08 DIAGNOSIS — R195 Other fecal abnormalities: Secondary | ICD-10-CM | POA: Insufficient documentation

## 2022-07-08 NOTE — H&P (View-Only) (Signed)
 Referring Provider: Hasanaj, Xaje A, MD Primary Care Physician:  Hasanaj, Xaje A, MD Primary GI Physician: new   Chief Complaint  Patient presents with   Blood In Stools    Patient arrives with daughter Alicia Nash. Referred for positive occult blood fecal test.    HPI:   Alicia Nash is a 82 y.o. female with past medical history of arthritis, HLD, HTN, Breast cancer, Paroxysmal A fib, Pulmonary HTN, thoracic ascending aortic aneurysm  Patient presenting today as a new patient for blood in stools.  Last labs 05/2022 with hgb 10.3, TIBC 323, iron 44, Iron sat 14, ferritin 233 (iron studies normal in September)  Notably, endoscopic evaluation in 2019 with EGD/colonoscopy and capsule study for anemia with small bowel AVMs  Present:  Patient states that her doctor sent her for blood in the stool. She denies any changes in the bowels. No rectal bleeding or black stools. Denies abdominal pain, weight loss, changes in appetite. No nausea or vomiting. She had some dizziness last week that she saw her doctor, given some medication which she had improvement from. She does have a lot of fatigue but this is chronic. She has a BM 1-3x/day. Denies any issues with dysphagia or odynophagia. No NSAID use. Diclofenac listed in chart but she states she does not use this.   Per chart review, Notably had feraheme infusions x2 in 2022, reported some intermittent black tarry stool to  hematology in June 2023. She denies melena today.  She is maintained on eliquis has been on this for about 1 year r/t hx of A fib. She does take PPI BID. No issues with GERD while on PPI.   NSAID use: just tylenol  Social hx: no tobacco or etoh  Fam hx: no crc or liver disease   Last Colonoscopy:2019 normal  Givens capsule study: 2019 Study is complete as given capsule reached cecum. Few small small bowel punctate AV malformations noted with speck of blood but no active bleeding noted. Last Endoscopy:2019- Normal  esophagus.                           - Z-line regular, 43 cm from the incisors.                           - Normal stomach.                           - Normal duodenal bulb, second portion of the                            duodenum and third portion of the duodenum.                           - No specimens collected. Recommendations:    Past Medical History:  Diagnosis Date   Arthritis    Cellulitis    Right leg   Hyperlipidemia    Hypertension    Lobular carcinoma of right breast (HCC) 09/27/2014   Malignant neoplasm of upper-outer quadrant of right female breast (HCC) 09/27/2014   Paroxysmal atrial fibrillation (HCC)    Pulmonary hypertension (HCC)    Thoracic ascending aortic aneurysm (HCC)    Varicose veins     Past Surgical History:  Procedure Laterality Date   ABDOMINAL HYSTERECTOMY       CHOLECYSTECTOMY     ESOPHAGOGASTRODUODENOSCOPY N/A 01/13/2018   Procedure: ESOPHAGOGASTRODUODENOSCOPY (EGD);  Surgeon: Rehman, Najeeb U, MD;  [facility];  Service: Endoscopy;  Laterality: N/A;   FOOT SURGERY     Multiple foot surgeries by Dr. Joyce   GIVENS CAPSULE STUDY N/A 08/26/2017   Procedure: GIVENS CAPSULE STUDY;  Surgeon: Rehman, Najeeb U, MD;  [facility];  Service: Endoscopy;  Laterality: N/A;  8:30   JOINT REPLACEMENT     bilateral knee by Drs. McGee and McGinley   PACEMAKER IMPLANT Left 05/12/2013   MDT Sensia PPM implanted by Dr Kok for CHB and SSS   SHOULDER SURGERY      Current Outpatient Medications  Medication Sig Dispense Refill   acetaZOLAMIDE (DIAMOX) 250 MG tablet Take 250 mg by mouth. One half tablet daily     albuterol (VENTOLIN HFA) 108 (90 Base) MCG/ACT inhaler      anastrozole (ARIMIDEX) 1 MG tablet TAKE ONE TABLET BY MOUTH ONCE DAILY 30 tablet 5   apixaban (ELIQUIS) 2.5 MG TABS tablet Take 1 tablet (2.5 mg total) by mouth 2 (two) times daily. 60 tablet 1   carvedilol (COREG) 12.5 MG tablet 2 (two) times daily with a meal.      citalopram (CELEXA) 20 MG tablet Take 20 mg by mouth every morning.      Colchicine 0.6 MG CAPS Take 1 capsule by mouth daily.     CRANBERRY PO Take 1 tablet by mouth every morning. 250 mg one daily     docusate sodium (COLACE) 100 MG capsule Take 100 mg by mouth every morning.     gemfibrozil (LOPID) 600 MG tablet Take 1 tablet by mouth 2 (two) times daily before a meal.     hydrALAZINE (APRESOLINE) 50 MG tablet Take 1 tablet (50 mg total) by mouth in the morning and at bedtime. 180 tablet 3   ipratropium-albuterol (DUONEB) 0.5-2.5 (3) MG/3ML SOLN Take 3 mLs by nebulization 4 (four) times daily.     KRILL OIL PO Take by mouth. 2,000 mg one daily     olmesartan (BENICAR) 40 MG tablet TAKE ONE TABLET BY MOUTH DAILY 30 tablet 11   OVER THE COUNTER MEDICATION Tumeric one daily     pantoprazole (PROTONIX) 40 MG tablet Take 40 mg by mouth 2 (two) times daily.     rosuvastatin (CRESTOR) 10 MG tablet Take 10 mg by mouth daily.     No current facility-administered medications for this visit.   Facility-Administered Medications Ordered in Other Visits  Medication Dose Route Frequency Provider Last Rate Last Admin   0.9 %  sodium chloride infusion   Intravenous Continuous Katragadda, Sreedhar, MD 20 mL/hr at 05/07/18 1447 New Bag at 05/07/18 1447    Allergies as of 07/08/2022 - Review Complete 07/08/2022  Allergen Reaction Noted   Dihydrocodeine Other (See Comments) 05/11/2013   Allopurinol  07/08/2022   Cefuroxime  07/08/2022   Fosamax [alendronate]  07/08/2022   Hydrocodone  07/08/2022   Other  07/16/2011    Family History  Problem Relation Age of Onset   Hypertension Other    Diabetes Mother     Social History   Socioeconomic History   Marital status: Divorced    Spouse name: Not on file   Number of children: Not on file   Years of education: Not on file   Highest education level: Not on file  Occupational History   Not on file  Tobacco Use   Smoking status:   Never    Passive  exposure: Past   Smokeless tobacco: Never  Vaping Use   Vaping Use: Never used  Substance and Sexual Activity   Alcohol use: No   Drug use: No   Sexual activity: Not on file  Other Topics Concern   Not on file  Social History Narrative   Lives in [name]   Divorced   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   Social Determinants of Health   Financial Resource Strain: Low Risk  (03/08/2020)   Overall Financial Resource Strain (CARDIA)    Difficulty of Paying Living Expenses: Not hard at all  Food Insecurity: No Food Insecurity (03/08/2020)   Hunger Vital Sign    Worried About Running Out of Food in the Last Year: Never true    Ran Out of Food in the Last Year: Never true  Transportation Needs: No Transportation Needs (03/08/2020)   PRAPARE - Transportation    Lack of Transportation (Medical): No    Lack of Transportation (Non-Medical): No  Physical Activity: Insufficiently Active (03/08/2020)   Exercise Vital Sign    Days of Exercise per Week: 2 days    Minutes of Exercise per Session: 30 min  Stress: No Stress Concern Present (03/08/2020)   [employer] - Occupational Stress Questionnaire    Feeling of Stress : Not at all  Social Connections: Moderately Isolated (03/08/2020)   Social Connection and Isolation Panel [NHANES]    Frequency of Communication with Friends and Family: More than three times a week    Frequency of Social Gatherings with Friends and Family: More than three times a week    Attends Religious Services: More than 4 times per year    Active Member of Clubs or Organizations: No    Attends Club or Organization Meetings: Never    Marital Status: Widowed    Review of systems General: negative for night sweats, fever, chills, weight loss +fatigue Neck: Negative for lumps, goiter, pain and significant neck swelling Resp: Negative for cough, wheezing, dyspnea at rest CV: Negative for chest pain, leg swelling, palpitations,  orthopnea GI: denies melena, hematochezia, nausea, vomiting, diarrhea, constipation, dysphagia, odyonophagia, early satiety or unintentional weight loss.  MSK: Negative for joint pain or swelling, back pain, and muscle pain. Derm: Negative for itching or rash Psych: Denies depression, anxiety, memory loss, confusion. No homicidal or suicidal ideation.  Heme: Negative for prolonged bleeding, bruising easily, and swollen nodes. Endocrine: Negative for cold or heat intolerance, polyuria, polydipsia and goiter. Neuro: negative for tremor, gait imbalance, syncope and seizures. The remainder of the review of systems is noncontributory.  Physical Exam: BP 135/73 (BP Location: Left Arm, Patient Position: Sitting, Cuff Size: Large)   Pulse 63   Temp 98.6 F (37 C) (Oral)   Ht 5' 3" (1.6 m)   Wt 188 lb 1.6 oz (85.3 kg)   BMI 33.32 kg/m  General:   Alert and oriented. No distress noted. Pleasant and cooperative.  Head:  Normocephalic and atraumatic. Eyes:  Conjuctiva clear without scleral icterus. Mouth:  Oral mucosa pink and moist. Good dentition. No lesions. Heart: Normal rate and rhythm, s1 and s2 heart sounds present.  Lungs: Clear lung sounds in all lobes. Respirations equal and unlabored. Abdomen:  +BS, soft, non-tender and non-distended. No rebound or guarding. No HSM or masses noted. Derm: No palmar erythema or jaundice Msk:  Symmetrical without gross deformities. Normal posture. Extremities:  Without edema. Neurologic:  Alert and  oriented x4 Psych:    Alert and cooperative. Normal mood and affect.  Invalid input(s): "6 MONTHS"   ASSESSMENT: Alicia Nash is a 81 y.o. female presenting today as a new patient for heme positive stool and IDA.  IDA/Heme positive stool: notably with similar presentation in 2019, underwent EGD/Colonoscopy and givens capsule study with small bowel AVMs noted. She denies frank blood in stools or melena, though previously reported intermittent melena at  her hematology visit in June 2023.  She also received Feraheme infusions x 2 in 2022 for ongoing iron deficiency anemia.  She has had no weight loss or changes in bowel habits.  Given her ongoing iron deficiency anemia and heme positive stool I would recommend proceeding with upper endoscopy and colonoscopy at this time especially given her history of small bowel AVMs.  She may ultimately need Givens capsule study if EGD and colonoscopy are unremarkable.  She is not currently taking any NSAIDs and should continue to avoid these.  Should continue on PPI twice daily. Indications, risks and benefits of procedure discussed in detail with patient. Patient verbalized understanding and is in agreement to proceed with EGD/Colonoscopy    PLAN:  EGD/Colonoscopy-ASA III ENDO 3(needs cards clearance (dr mealer), hold eliquis x2 days) 2. Continue PPI BID  3. Avoid NSAIDs 4. May need to consider givens capsule study if the above endoscopic evaluations are unremarkable   All questions were answered, patient verbalized understanding and is in agreement with plan as outlined above.   Follow Up: 3 months   Laiyla Slagel L. Audyn Dimercurio, MSN, APRN, AGNP-C Adult-Gerontology Nurse Practitioner [facility]  I have reviewed the note and agree with the APP's assessment as described in this progress note  Daniel Castaneda, MD Gastroenterology and Hepatology [facility]  

## 2022-07-08 NOTE — Progress Notes (Signed)
Referring Provider: [name], MD Primary Care Physician:  [name], MD Primary GI Physician: new   Chief Complaint  Patient presents with   Blood In Stools    Patient arrives with daughter Alicia Nash. Referred for positive occult blood fecal test.    HPI:   Alicia Nash is a 82 y.o. female with past medical history of arthritis, HLD, HTN, Breast cancer, Paroxysmal A fib, Pulmonary HTN, thoracic ascending aortic aneurysm  Patient presenting today as a new patient for blood in stools.  Last labs 05/2022 with hgb 10.3, TIBC 323, iron 44, Iron sat 14, ferritin 233 (iron studies normal in September)  Notably, endoscopic evaluation in 2019 with EGD/colonoscopy and capsule study for anemia with small bowel AVMs  Present:  Patient states that her doctor sent her for blood in the stool. She denies any changes in the bowels. No rectal bleeding or black stools. Denies abdominal pain, weight loss, changes in appetite. No nausea or vomiting. She had some dizziness last week that she saw her doctor, given some medication which she had improvement from. She does have a lot of fatigue but this is chronic. She has a BM 1-3x/day. Denies any issues with dysphagia or odynophagia. No NSAID use. Diclofenac listed in chart but she states she does not use this.   Per chart review, Notably had feraheme infusions x2 in 2022, reported some intermittent black tarry stool to  hematology in June 2023. She denies melena today.  She is maintained on eliquis has been on this for about 1 year r/t hx of A fib. She does take PPI BID. No issues with GERD while on PPI.   NSAID use: just tylenol  Social hx: no tobacco or etoh  Fam hx: no crc or liver disease   Last Colonoscopy:2019 normal  Givens capsule study: 2019 Study is complete as given capsule reached cecum. Few small small bowel punctate AV malformations noted with speck of blood but no active bleeding noted. Last Endoscopy:2019- Normal  esophagus.                           - Z-line regular, 43 cm from the incisors.                           - Normal stomach.                           - Normal duodenal bulb, second portion of the                            duodenum and third portion of the duodenum.                           - No specimens collected. Recommendations:    Past Medical History:  Diagnosis Date   Arthritis    Cellulitis    Right leg   Hyperlipidemia    Hypertension    Lobular carcinoma of right breast ([place]) 09/27/2014   Malignant neoplasm of upper-outer quadrant of right female breast ([place]) 09/27/2014   Paroxysmal atrial fibrillation (HCC)    Pulmonary hypertension (HCC)    Thoracic ascending aortic aneurysm (HCC)    Varicose veins     Past Surgical History:  Procedure Laterality Date   ABDOMINAL HYSTERECTOMY  CHOLECYSTECTOMY     ESOPHAGOGASTRODUODENOSCOPY N/A 01/13/2018   Procedure: ESOPHAGOGASTRODUODENOSCOPY (EGD);  Surgeon: Rogene Houston, MD;  [facility];  Service: Endoscopy;  Laterality: N/A;   FOOT SURGERY     Multiple foot surgeries by Dr. Iona Coach CAPSULE STUDY N/A 08/26/2017   Procedure: GIVENS CAPSULE STUDY;  Surgeon: Rogene Houston, MD;  [facility];  Service: Endoscopy;  Laterality: N/A;  8:30   JOINT REPLACEMENT     bilateral knee by Drs. McGee and McGinley   PACEMAKER IMPLANT Left 05/12/2013   MDT Sherril Croon PPM implanted by Dr Dot Lanes for CHB and SSS   SHOULDER SURGERY      Current Outpatient Medications  Medication Sig Dispense Refill   acetaZOLAMIDE (DIAMOX) 250 MG tablet Take 250 mg by mouth. One half tablet daily     albuterol (VENTOLIN HFA) 108 (90 Base) MCG/ACT inhaler      anastrozole (ARIMIDEX) 1 MG tablet TAKE ONE TABLET BY MOUTH ONCE DAILY 30 tablet 5   apixaban (ELIQUIS) 2.5 MG TABS tablet Take 1 tablet (2.5 mg total) by mouth 2 (two) times daily. 60 tablet 1   carvedilol (COREG) 12.5 MG tablet 2 (two) times daily with a meal.      citalopram (CELEXA) 20 MG tablet Take 20 mg by mouth every morning.      Colchicine 0.6 MG CAPS Take 1 capsule by mouth daily.     CRANBERRY PO Take 1 tablet by mouth every morning. 250 mg one daily     docusate sodium (COLACE) 100 MG capsule Take 100 mg by mouth every morning.     gemfibrozil (LOPID) 600 MG tablet Take 1 tablet by mouth 2 (two) times daily before a meal.     hydrALAZINE (APRESOLINE) 50 MG tablet Take 1 tablet (50 mg total) by mouth in the morning and at bedtime. 180 tablet 3   ipratropium-albuterol (DUONEB) 0.5-2.5 (3) MG/3ML SOLN Take 3 mLs by nebulization 4 (four) times daily.     KRILL OIL PO Take by mouth. 2,000 mg one daily     olmesartan (BENICAR) 40 MG tablet TAKE ONE TABLET BY MOUTH DAILY 30 tablet 11   OVER THE COUNTER MEDICATION Tumeric one daily     pantoprazole (PROTONIX) 40 MG tablet Take 40 mg by mouth 2 (two) times daily.     rosuvastatin (CRESTOR) 10 MG tablet Take 10 mg by mouth daily.     No current facility-administered medications for this visit.   Facility-Administered Medications Ordered in Other Visits  Medication Dose Route Frequency Provider Last Rate Last Admin   0.9 %  sodium chloride infusion   Intravenous Continuous Derek Jack, MD 20 mL/hr at 05/07/18 1447 New Bag at 05/07/18 1447    Allergies as of 07/08/2022 - Review Complete 07/08/2022  Allergen Reaction Noted   Dihydrocodeine Other (See Comments) 05/11/2013   Allopurinol  07/08/2022   Cefuroxime  07/08/2022   Fosamax [alendronate]  07/08/2022   Hydrocodone  07/08/2022   Other  07/16/2011    Family History  Problem Relation Age of Onset   Hypertension Other    Diabetes Mother     Social History   Socioeconomic History   Marital status: Divorced    Spouse name: Not on file   Number of children: Not on file   Years of education: Not on file   Highest education level: Not on file  Occupational History   Not on file  Tobacco Use   Smoking status:  Never    Passive  exposure: Past   Smokeless tobacco: Never  Vaping Use   Vaping Use: Never used  Substance and Sexual Activity   Alcohol use: No   Drug use: No   Sexual activity: Not on file  Other Topics Concern   Not on file  Social History Narrative   Lives in [place]   Divorced   Retired from [employer] Strain: [place]  (03/08/2020)   Overall Financial Resource Strain (CARDIA)    Difficulty of Paying Living Expenses: Not hard at all  Food Insecurity: No Food Insecurity (03/08/2020)   Hunger Vital Sign    Worried About Running Out of Food in the Last Year: Never true    Ran Out of Food in the Last Year: Never true  Transportation Needs: No Transportation Needs (03/08/2020)   PRAPARE - Hydrologist (Medical): No    Lack of Transportation (Non-Medical): No  Physical Activity: Insufficiently Active (03/08/2020)   Exercise Vital Sign    Days of Exercise per Week: 2 days    Minutes of Exercise per Session: 30 min  Stress: No Stress Concern Present (03/08/2020)   [place]    Feeling of Stress : Not at all  Social Connections: Moderately Isolated (03/08/2020)   Social Connection and Isolation Panel [NHANES]    Frequency of Communication with Friends and Family: More than three times a week    Frequency of Social Gatherings with Friends and Family: More than three times a week    Attends Religious Services: More than 4 times per year    Active Member of [employer] or Organizations: No    Attends Archivist Meetings: Never    Marital Status: Widowed    Review of systems General: negative for night sweats, fever, chills, weight loss +fatigue Neck: Negative for lumps, goiter, pain and significant neck swelling Resp: Negative for cough, wheezing, dyspnea at rest CV: Negative for chest pain, leg swelling, palpitations,  orthopnea GI: denies melena, hematochezia, nausea, vomiting, diarrhea, constipation, dysphagia, odyonophagia, early satiety or unintentional weight loss.  MSK: Negative for joint pain or swelling, back pain, and muscle pain. Derm: Negative for itching or rash Psych: Denies depression, anxiety, memory loss, confusion. No homicidal or suicidal ideation.  Heme: Negative for prolonged bleeding, bruising easily, and swollen nodes. Endocrine: Negative for cold or heat intolerance, polyuria, polydipsia and goiter. Neuro: negative for tremor, gait imbalance, syncope and seizures. The remainder of the review of systems is noncontributory.  Physical Exam: BP 135/73 (BP Location: Left Arm, Patient Position: Sitting, Cuff Size: Large)   Pulse 63   Temp 98.6 F (37 C) (Oral)   Ht '5\' 3"'$  (1.6 m)   Wt 188 lb 1.6 oz (85.3 kg)   BMI 33.32 kg/m  General:   Alert and oriented. No distress noted. Pleasant and cooperative.  Head:  Normocephalic and atraumatic. Eyes:  Conjuctiva clear without scleral icterus. Mouth:  Oral mucosa pink and moist. Good dentition. No lesions. Heart: Normal rate and rhythm, s1 and s2 heart sounds present.  Lungs: Clear lung sounds in all lobes. Respirations equal and unlabored. Abdomen:  +BS, soft, non-tender and non-distended. No rebound or guarding. No HSM or masses noted. Derm: No palmar erythema or jaundice Msk:  Symmetrical without gross deformities. Normal posture. Extremities:  Without edema. Neurologic:  Alert and  oriented x4 Psych:  Alert and cooperative. Normal mood and affect.  Invalid input(s): "6 MONTHS"   ASSESSMENT: KENDY PATZER is a 82 y.o. female presenting today as a new patient for heme positive stool and IDA.  IDA/Heme positive stool: notably with similar presentation in 2019, underwent EGD/Colonoscopy and givens capsule study with small bowel AVMs noted. She denies frank blood in stools or melena, though previously reported intermittent melena at  her hematology visit in June 2023.  She also received Feraheme infusions x 2 in 2022 for ongoing iron deficiency anemia.  She has had no weight loss or changes in bowel habits.  Given her ongoing iron deficiency anemia and heme positive stool I would recommend proceeding with upper endoscopy and colonoscopy at this time especially given her history of small bowel AVMs.  She may ultimately need Givens capsule study if EGD and colonoscopy are unremarkable.  She is not currently taking any NSAIDs and should continue to avoid these.  Should continue on PPI twice daily. Indications, risks and benefits of procedure discussed in detail with patient. Patient verbalized understanding and is in agreement to proceed with EGD/Colonoscopy    PLAN:  EGD/Colonoscopy-ASA III ENDO 3(needs cards clearance (dr Lindajo Royal), hold eliquis x2 days) 2. Continue PPI BID  3. Avoid NSAIDs 4. May need to consider givens capsule study if the above endoscopic evaluations are unremarkable   All questions were answered, patient verbalized understanding and is in agreement with plan as outlined above.   Follow Up: 3 months   Demica Zook L. Alver Sorrow, MSN, APRN, AGNP-C Adult-Gerontology Nurse Practitioner [facility]  I have reviewed the note and agree with the APP's assessment as described in this progress note  Maylon Peppers, MD Gastroenterology and Hepatology [facility]

## 2022-07-08 NOTE — Telephone Encounter (Signed)
    07/08/22  Ross 1940/05/14  What type of surgery is being performed? EGD/TCS  When is surgery scheduled? TBD  Request to hold Eliquis 2 days prior   Name of physician performing surgery?  Dr. Maylon Peppers [facility] Gastroenterology at [facility] Phone: [phone] Fax: [phone]  Anethesia type (none, local, MAC, general)? MAC

## 2022-07-08 NOTE — Patient Instructions (Signed)
We will get you scheduled for EGD and colonoscopy once we get clearance from cardiology Please let me know if you have blood in your stools or black stools   Follow up 3 months  It was a pleasure to see you today. I want to create trusting relationships with patients and provide genuine, compassionate, and quality care. I truly value your feedback! please be on the lookout for a survey regarding your visit with me today. I appreciate your input about our visit and your time in completing this!    Saylor Murry L. Alver Sorrow, MSN, APRN, AGNP-C Adult-Gerontology Nurse Practitioner [facility]enter[facility] at [facility]

## 2022-07-09 MED ORDER — PEG 3350-KCL-NA BICARB-NACL 420 G PO SOLR: 4000.0000 mL | Freq: Once | ORAL | 0 refills | Status: AC

## 2022-07-09 NOTE — Telephone Encounter (Signed)
Thanks, can you try reaching her son or listed family members? thanks

## 2022-07-09 NOTE — Telephone Encounter (Signed)
Attempted to contact patient to schedule EGD/TCS ASA 3 Endo 3. Phone message states this number is not accepting calls at this time.

## 2022-07-09 NOTE — Telephone Encounter (Signed)
Pt returned call. EDG/TCS scheduled for 08/05/22. Instructions mailed to patient. Advised pt to hold Eliquis 2 days prior. Prep sent to pharmacy. Will call with pre op time/date.  PA approved [id number] DOS Jul 09, 2022 - Oct 07, 2022 PA approved [id number] Cyril Loosen [id number] [phone] (Did one PA for EGD and one for TCS- my mistake)

## 2022-07-09 NOTE — Telephone Encounter (Signed)
Sam, sorry about that, message was meant to be sent to [facility]. Tanya please see message below

## 2022-07-09 NOTE — Telephone Encounter (Signed)
Contacted son and he will get in touch with mom to give [place] a call back

## 2022-07-09 NOTE — Addendum Note (Signed)
Addended by: Vicente Males on: 07/09/2022 02:50 PM   Modules accepted: Orders

## 2022-07-17 NOTE — Telephone Encounter (Signed)
Pt contacted regarding pre op appt on 07/31/22 in person Alicia Nash at 10:00am. Pt verbalized understanding.

## 2022-07-29 ENCOUNTER — Encounter (HOSPITAL_COMMUNITY): Payer: Self-pay

## 2022-07-29 ENCOUNTER — Encounter: Payer: Self-pay | Admitting: Cardiovascular Disease

## 2022-07-29 ENCOUNTER — Telehealth: Payer: Self-pay

## 2022-07-29 NOTE — Patient Instructions (Signed)
Alicia Nash  07/29/2022     @PREFPERIOPPHARMACY @   Your procedure is scheduled on  08/05/2022.   Report to [facility] at  0830  A.M.   Call this number if you have problems the morning of surgery:  [phone]  If you experience any cold or flu symptoms such as cough, fever, chills, shortness of breath, etc. between now and your scheduled surgery, please notify us at the above number.   Remember:  Follow the diet and prep instructions given to you by the office.                  Your last dose of eliquis should be on 08/02/2022.        Use your nebulizer and your inhaler before you come and bring your rescue inhaler with you,          Take these medicines the morning of surgery with A SIP OF WATER               diamox, arimidex, carvedilol, celexa, colchicine, pantoprazole.     Do not wear jewelry, make-up or nail polish.  Do not wear lotions, powders, or perfumes, or deodorant.  Do not shave 48 hours prior to surgery.  Men may shave face and neck.  Do not bring valuables to the hospital.  [facility] is not responsible for any belongings or valuables.  Contacts, dentures or bridgework may not be worn into surgery.  Leave your suitcase in the car.  After surgery it may be brought to your room.  For patients admitted to the hospital, discharge time will be determined by your treatment team.  Patients discharged the day of surgery will not be allowed to drive home and must have someone with them for 24 hours.    Special instructions:           DO NOT smoke tobacco or vape for 24 hours before your procedure.    Please read over the following fact sheets that you were given. Anesthesia Post-op Instructions and Care and Recovery After Surgery      Upper Endoscopy, Adult, Care After After the procedure, it is common to have a sore throat. It is also common to have: Mild stomach pain or discomfort. Bloating. Nausea. Follow these instructions at  home: The instructions below may help you care for yourself at home. Your health care provider may give you more instructions. If you have questions, ask your health care provider. If you were given a sedative during the procedure, it can affect you for several hours. Do not drive or operate machinery until your health care provider says that it is safe. If you will be going home right after the procedure, plan to have a responsible adult: Take you home from the hospital or clinic. You will not be allowed to drive. Care for you for the time you are told. Follow instructions from your health care provider about what you may eat and drink. Return to your normal activities as told by your health care provider. Ask your health care provider what activities are safe for you. Take over-the-counter and prescription medicines only as told by your health care provider. Contact a health care provider if you: Have a sore throat that lasts longer than one day. Have trouble swallowing. Have a fever. Get help right away if you: Vomit blood or your vomit looks like coffee grounds. Have bloody, black, or tarry stools. Have  a very bad sore throat or you cannot swallow. Have difficulty breathing or very bad pain in your chest or abdomen. These symptoms may be an emergency. Get help right away. Call 911. Do not wait to see if the symptoms will go away. Do not drive yourself to the hospital. Summary After the procedure, it is common to have a sore throat, mild stomach discomfort, bloating, and nausea. If you were given a sedative during the procedure, it can affect you for several hours. Do not drive until your health care provider says that it is safe. Follow instructions from your health care provider about what you may eat and drink. Return to your normal activities as told by your health care provider. This information is not intended to replace advice given to you by your health care provider. Make sure  you discuss any questions you have with your health care provider. Document Revised: 07/31/2021 Document Reviewed: 07/31/2021 Elsevier Patient Education  [place]. Colonoscopy, Adult, Care After The following information offers guidance on how to care for yourself after your procedure. Your health care provider may also give you more specific instructions. If you have problems or questions, contact your health care provider. What can I expect after the procedure? After the procedure, it is common to have: A small amount of blood in your stool for 24 hours after the procedure. Some gas. Mild cramping or bloating of your abdomen. Follow these instructions at home: Eating and drinking  Drink enough fluid to keep your urine pale yellow. Follow instructions from your health care provider about eating or drinking restrictions. Resume your normal diet as told by your health care provider. Avoid heavy or fried foods that are hard to digest. Activity Rest as told by your health care provider. Avoid sitting for a Swopes time without moving. Get up to take short walks every 1-2 hours. This is important to improve blood flow and breathing. Ask for help if you feel weak or unsteady. Return to your normal activities as told by your health care provider. Ask your health care provider what activities are safe for you. Managing cramping and bloating  Try walking around when you have cramps or feel bloated. If directed, apply heat to your abdomen as told by your health care provider. Use the heat source that your health care provider recommends, such as a moist heat pack or a heating pad. Place a towel between your skin and the heat source. Leave the heat on for 20-30 minutes. Remove the heat if your skin turns bright red. This is especially important if you are unable to feel pain, heat, or cold. You have a greater risk of getting burned. General instructions If you were given a sedative during  the procedure, it can affect you for several hours. Do not drive or operate machinery until your health care provider says that it is safe. For the first 24 hours after the procedure: Do not sign important documents. Do not drink alcohol. Do your regular daily activities at a slower pace than normal. Eat soft foods that are easy to digest. Take over-the-counter and prescription medicines only as told by your health care provider. Keep all follow-up visits. This is important. Contact a health care provider if: You have blood in your stool 2-3 days after the procedure. Get help right away if: You have more than a small spotting of blood in your stool. You have large blood clots in your stool. You have swelling of your abdomen. You  have nausea or vomiting. You have a fever. You have increasing pain in your abdomen that is not relieved with medicine. These symptoms may be an emergency. Get help right away. Call 911. Do not wait to see if the symptoms will go away. Do not drive yourself to the hospital. Summary After the procedure, it is common to have a small amount of blood in your stool. You may also have mild cramping and bloating of your abdomen. If you were given a sedative during the procedure, it can affect you for several hours. Do not drive or operate machinery until your health care provider says that it is safe. Get help right away if you have a lot of blood in your stool, nausea or vomiting, a fever, or increased pain in your abdomen. This information is not intended to replace advice given to you by your health care provider. Make sure you discuss any questions you have with your health care provider. Document Revised: 12/12/2020 Document Reviewed: 12/12/2020 Elsevier Patient Education  [place] After The following information offers guidance on how to care for yourself after your procedure. Your health care provider may also give you  more specific instructions. If you have problems or questions, contact your health care provider. What can I expect after the procedure? After the procedure, it is common to have: Tiredness. Little or no memory about what happened during or after the procedure. Impaired judgment when it comes to making decisions. Nausea or vomiting. Some trouble with balance. Follow these instructions at home: For the time period you were told by your health care provider:  Rest. Do not participate in activities where you could fall or become injured. Do not drive or use machinery. Do not drink alcohol. Do not take sleeping pills or medicines that cause drowsiness. Do not make important decisions or sign legal documents. Do not take care of children on your own. Medicines Take over-the-counter and prescription medicines only as told by your health care provider. If you were prescribed antibiotics, take them as told by your health care provider. Do not stop using the antibiotic even if you start to feel better. Eating and drinking Follow instructions from your health care provider about what you may eat and drink. Drink enough fluid to keep your urine pale yellow. If you vomit: Drink clear fluids slowly and in small amounts as you are able. Clear fluids include water, ice chips, low-calorie sports drinks, and fruit juice that has water added to it (diluted fruit juice). Eat light and bland foods in small amounts as you are able. These foods include bananas, applesauce, rice, lean meats, toast, and crackers. General instructions  Have a responsible adult stay with you for the time you are told. It is important to have someone help care for you until you are awake and alert. If you have sleep apnea, surgery and some medicines can increase your risk for breathing problems. Follow instructions from your health care provider about wearing your sleep device: When you are sleeping. This includes during daytime  naps. While taking prescription pain medicines, sleeping medicines, or medicines that make you drowsy. Do not use any products that contain nicotine or tobacco. These products include cigarettes, chewing tobacco, and vaping devices, such as e-cigarettes. If you need help quitting, ask your health care provider. Contact a health care provider if: You feel nauseous or vomit every time you eat or drink. You feel light-headed. You are still sleepy or having trouble  with balance after 24 hours. You get a rash. You have a fever. You have redness or swelling around the IV site. Get help right away if: You have trouble breathing. You have new confusion after you get home. These symptoms may be an emergency. Get help right away. Call 911. Do not wait to see if the symptoms will go away. Do not drive yourself to the hospital. This information is not intended to replace advice given to you by your health care provider. Make sure you discuss any questions you have with your health care provider. Document Revised: 09/16/2021 Document Reviewed: 09/16/2021 Elsevier Patient Education  [place].

## 2022-07-29 NOTE — Progress Notes (Signed)
PERIOPERATIVE PRESCRIPTION FOR IMPLANTED CARDIAC DEVICE PROGRAMMING  Patient Information: Name:  Alicia Nash  DOB:  28-Jul-1940  MRN:  [id number]   Planned Procedure:  colonoscopy with propofol Surgeon:  Dr. Jenetta Downer Date of Procedure:  08/05/2022 Position during surgery:  left lateral   Device Information:  Clinic EP Physician:  Dr. Doralee Albino  Device Type:  Pacemaker Manufacturer and Phone #:  [employer]: [id number] Pacemaker Dependent?:  Yes.   Date of Last Device Check:  06/17/2022 Normal Device Function?:  Yes.  -nearing ERI  Electrophysiologist's Recommendations:  Have magnet available. Provide continuous ECG monitoring when magnet is used or reprogramming is to be performed.  Procedure should not interfere with device function.  No device programming or magnet placement needed.  Per Device Clinic Standing Orders, Damian Leavell, RN  3:31 PM 07/29/2022

## 2022-07-29 NOTE — Telephone Encounter (Signed)
Pt is nearing ERI.  Pt is dependent.  Pt missed last remote transmission.  Outreach made to Pt.  Left message requesting call back to device clinic to initiate remote transmission.

## 2022-07-30 NOTE — Progress Notes (Signed)
Remote pacemaker transmission.   

## 2022-07-31 ENCOUNTER — Encounter (HOSPITAL_COMMUNITY)
Admission: RE | Admit: 2022-07-31 | Discharge: 2022-07-31 | Disposition: A | Discharge status: Home / Self Care | Payer: Medicare Other | Source: Ambulatory Visit | Attending: Gastroenterology | Admitting: Gastroenterology

## 2022-07-31 ENCOUNTER — Encounter (HOSPITAL_COMMUNITY): Payer: Self-pay

## 2022-07-31 VITALS — BP 159/58 | HR 60 | Resp 18 | Ht 63.0 in | Wt 188.1 lb

## 2022-07-31 DIAGNOSIS — D508 Other iron deficiency anemias: Secondary | ICD-10-CM | POA: Diagnosis not present

## 2022-07-31 DIAGNOSIS — Z01812 Encounter for preprocedural laboratory examination: Secondary | ICD-10-CM | POA: Diagnosis not present

## 2022-07-31 DIAGNOSIS — N183 Chronic kidney disease, stage 3 unspecified: Secondary | ICD-10-CM | POA: Diagnosis not present

## 2022-07-31 DIAGNOSIS — D649 Anemia, unspecified: Secondary | ICD-10-CM

## 2022-07-31 HISTORY — DX: Presence of cardiac pacemaker: Z95.0

## 2022-07-31 LAB — CBC WITH DIFFERENTIAL/PLATELET
Abs Immature Granulocytes: 0.01 10*3/uL (ref 0.00–0.07)
Basophils Absolute: 0.1 10*3/uL (ref 0.0–0.1)
Basophils Relative: 2 %
Eosinophils Absolute: 0.2 10*3/uL (ref 0.0–0.5)
Eosinophils Relative: 6 %
HCT: 34 % — ABNORMAL LOW (ref 36.0–46.0)
Hemoglobin: 10.5 g/dL — ABNORMAL LOW (ref 12.0–15.0)
Immature Granulocytes: 0 %
Lymphocytes Relative: 19 %
Lymphs Abs: 0.7 10*3/uL (ref 0.7–4.0)
MCH: 29.1 pg (ref 26.0–34.0)
MCHC: 30.9 g/dL (ref 30.0–36.0)
MCV: 94.2 fL (ref 80.0–100.0)
Monocytes Absolute: 0.4 10*3/uL (ref 0.1–1.0)
Monocytes Relative: 10 %
Neutro Abs: 2.4 10*3/uL (ref 1.7–7.7)
Neutrophils Relative %: 63 %
Platelets: 167 10*3/uL (ref 150–400)
RBC: 3.61 MIL/uL — ABNORMAL LOW (ref 3.87–5.11)
RDW: 14.7 % (ref 11.5–15.5)
WBC: 3.8 10*3/uL — ABNORMAL LOW (ref 4.0–10.5)
nRBC: 0 % (ref 0.0–0.2)

## 2022-07-31 LAB — BASIC METABOLIC PANEL
Anion gap: 11 (ref 5–15)
BUN: 17 mg/dL (ref 8–23)
CO2: 16 mmol/L — ABNORMAL LOW (ref 22–32)
Calcium: 9.1 mg/dL (ref 8.9–10.3)
Chloride: 113 mmol/L — ABNORMAL HIGH (ref 98–111)
Creatinine, Ser: 1.13 mg/dL — ABNORMAL HIGH (ref 0.44–1.00)
GFR, Estimated: 49 mL/min — ABNORMAL LOW (ref >60)
Glucose, Bld: 131 mg/dL — ABNORMAL HIGH (ref 70–99)
Potassium: 3.5 mmol/L (ref 3.5–5.1)
Sodium: 140 mmol/L (ref 135–145)

## 2022-07-31 NOTE — Telephone Encounter (Signed)
I let the patient know that we need a transmission from her monitor.

## 2022-07-31 NOTE — Telephone Encounter (Signed)
Attempted to contact patient to send transmission. No answer, LMTCB.   Mychart has not been set up yet.

## 2022-08-05 ENCOUNTER — Ambulatory Visit (HOSPITAL_BASED_OUTPATIENT_CLINIC_OR_DEPARTMENT_OTHER): Payer: Medicare Other | Admitting: Certified Registered"

## 2022-08-05 ENCOUNTER — Ambulatory Visit (HOSPITAL_COMMUNITY)
Admission: RE | Admit: 2022-08-05 | Discharge: 2022-08-05 | Disposition: A | Discharge status: Home / Self Care | Payer: Medicare Other | Source: Ambulatory Visit | Attending: Gastroenterology | Admitting: Gastroenterology

## 2022-08-05 ENCOUNTER — Encounter (INDEPENDENT_AMBULATORY_CARE_PROVIDER_SITE_OTHER): Payer: Self-pay | Admitting: *Deleted

## 2022-08-05 ENCOUNTER — Encounter (HOSPITAL_COMMUNITY)
Admission: RE | Disposition: A | Discharge status: Home / Self Care | Payer: Self-pay | Source: Ambulatory Visit | Attending: Gastroenterology

## 2022-08-05 ENCOUNTER — Ambulatory Visit (HOSPITAL_COMMUNITY): Payer: Medicare Other | Admitting: Certified Registered"

## 2022-08-05 ENCOUNTER — Encounter (HOSPITAL_COMMUNITY): Payer: Self-pay | Admitting: Gastroenterology

## 2022-08-05 DIAGNOSIS — Z79899 Other long term (current) drug therapy: Secondary | ICD-10-CM | POA: Diagnosis not present

## 2022-08-05 DIAGNOSIS — K635 Polyp of colon: Secondary | ICD-10-CM | POA: Diagnosis not present

## 2022-08-05 DIAGNOSIS — K31819 Angiodysplasia of stomach and duodenum without bleeding: Secondary | ICD-10-CM

## 2022-08-05 DIAGNOSIS — Z7901 Long term (current) use of anticoagulants: Secondary | ICD-10-CM | POA: Diagnosis not present

## 2022-08-05 DIAGNOSIS — K6389 Other specified diseases of intestine: Secondary | ICD-10-CM | POA: Diagnosis not present

## 2022-08-05 DIAGNOSIS — I10 Essential (primary) hypertension: Secondary | ICD-10-CM | POA: Diagnosis not present

## 2022-08-05 DIAGNOSIS — K5730 Diverticulosis of large intestine without perforation or abscess without bleeding: Secondary | ICD-10-CM | POA: Diagnosis not present

## 2022-08-05 DIAGNOSIS — K2970 Gastritis, unspecified, without bleeding: Secondary | ICD-10-CM | POA: Diagnosis not present

## 2022-08-05 DIAGNOSIS — I7121 Aneurysm of the ascending aorta, without rupture: Secondary | ICD-10-CM | POA: Insufficient documentation

## 2022-08-05 DIAGNOSIS — I4891 Unspecified atrial fibrillation: Secondary | ICD-10-CM | POA: Diagnosis not present

## 2022-08-05 DIAGNOSIS — I272 Pulmonary hypertension, unspecified: Secondary | ICD-10-CM | POA: Diagnosis not present

## 2022-08-05 DIAGNOSIS — K921 Melena: Secondary | ICD-10-CM | POA: Insufficient documentation

## 2022-08-05 DIAGNOSIS — K5790 Diverticulosis of intestine, part unspecified, without perforation or abscess without bleeding: Secondary | ICD-10-CM | POA: Diagnosis not present

## 2022-08-05 DIAGNOSIS — E785 Hyperlipidemia, unspecified: Secondary | ICD-10-CM | POA: Insufficient documentation

## 2022-08-05 DIAGNOSIS — I480 Paroxysmal atrial fibrillation: Secondary | ICD-10-CM | POA: Diagnosis not present

## 2022-08-05 DIAGNOSIS — D124 Benign neoplasm of descending colon: Secondary | ICD-10-CM | POA: Diagnosis not present

## 2022-08-05 DIAGNOSIS — Z950 Presence of cardiac pacemaker: Secondary | ICD-10-CM | POA: Insufficient documentation

## 2022-08-05 DIAGNOSIS — D120 Benign neoplasm of cecum: Secondary | ICD-10-CM

## 2022-08-05 DIAGNOSIS — D509 Iron deficiency anemia, unspecified: Secondary | ICD-10-CM

## 2022-08-05 DIAGNOSIS — Z853 Personal history of malignant neoplasm of breast: Secondary | ICD-10-CM | POA: Insufficient documentation

## 2022-08-05 HISTORY — PX: ESOPHAGOGASTRODUODENOSCOPY (EGD) WITH PROPOFOL: SHX5813

## 2022-08-05 HISTORY — PX: POLYPECTOMY: SHX149

## 2022-08-05 HISTORY — PX: COLONOSCOPY WITH PROPOFOL: SHX5780

## 2022-08-05 HISTORY — PX: HOT HEMOSTASIS: SHX5433

## 2022-08-05 LAB — HM COLONOSCOPY

## 2022-08-05 IMAGING — DX DG CHEST 2V
2 series · 2 of 2 positions shown · non-contrast
Comparison: Chest x-ray 12/16/2020.

CLINICAL DATA: 80-year-old female with history of cough for the
past 3 weeks, worsened at night.

EXAM:
CHEST - 2 VIEW

[chest pa]
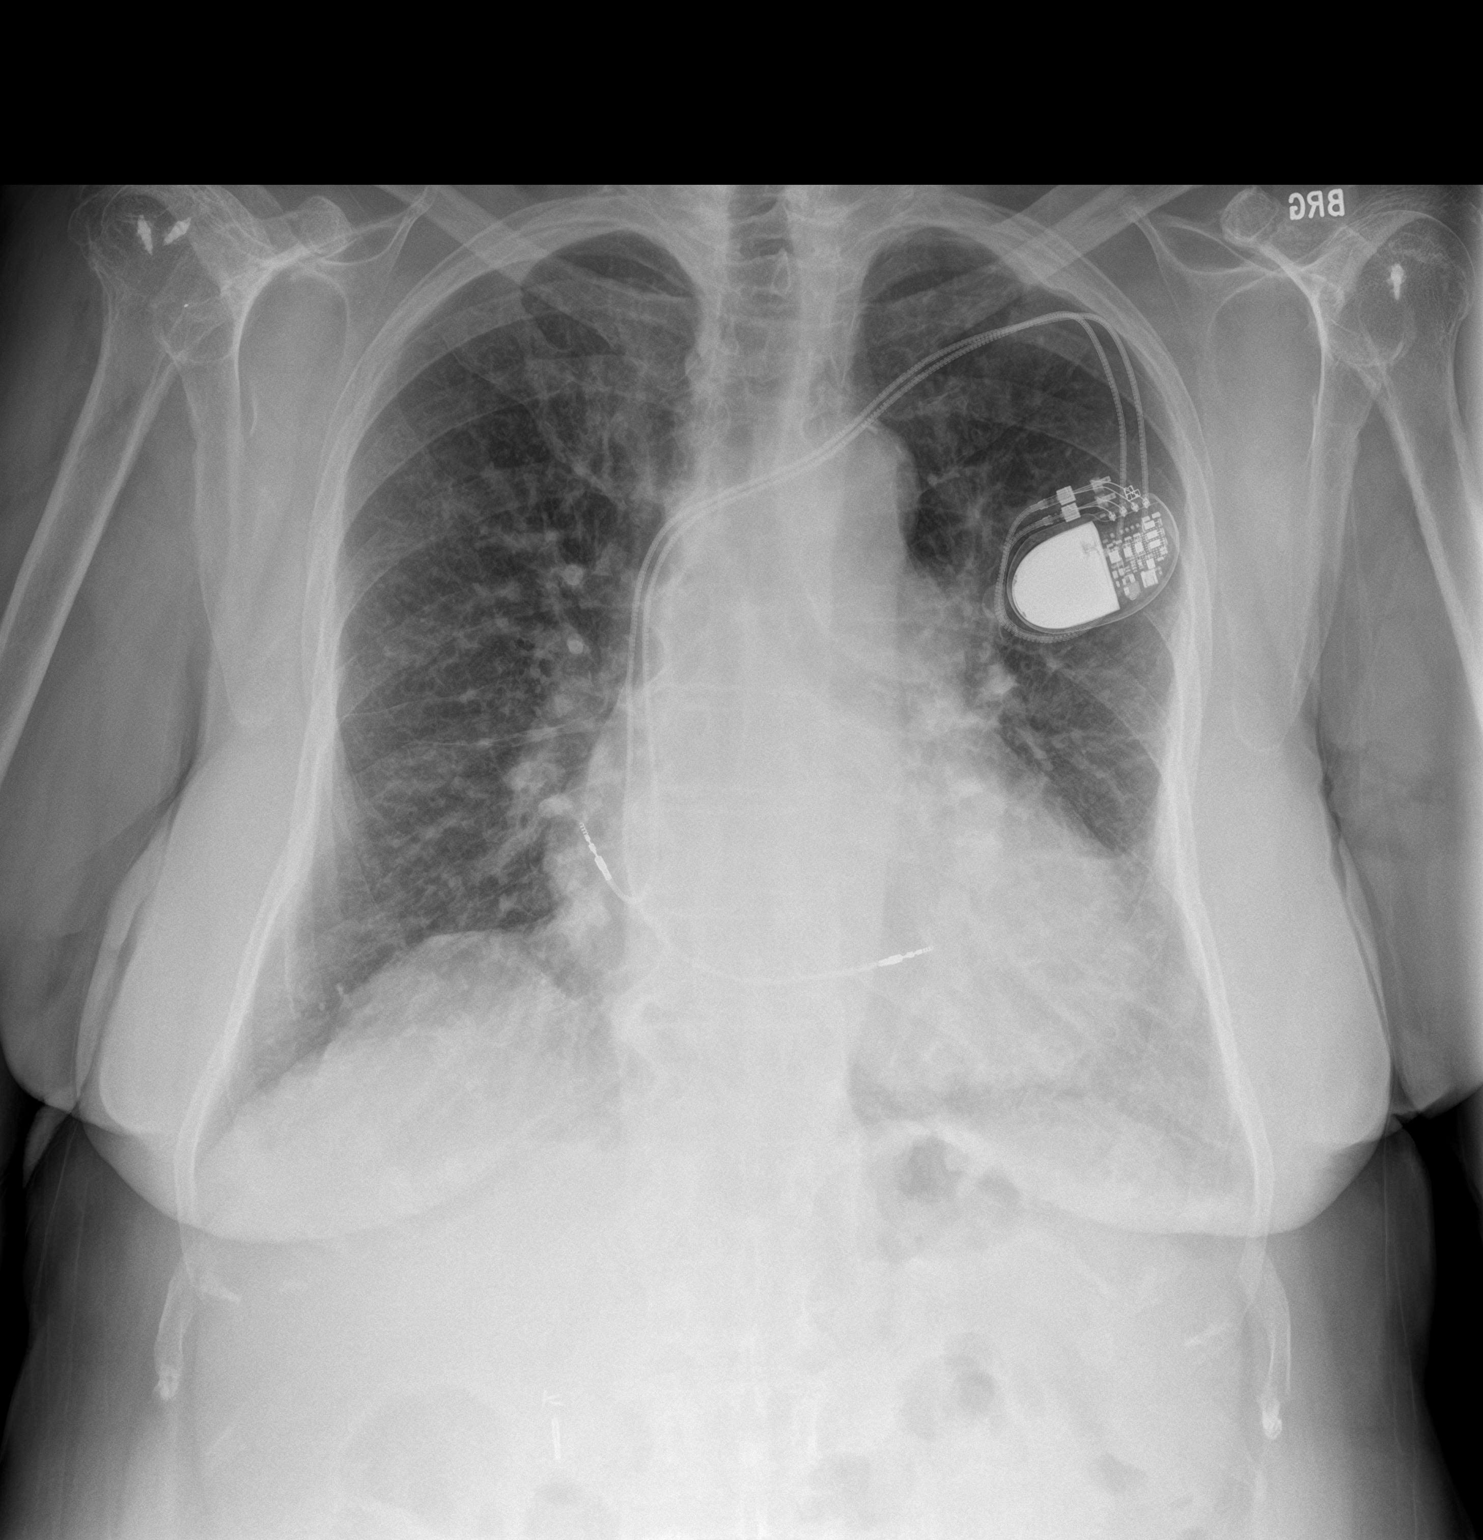

[chest lat]
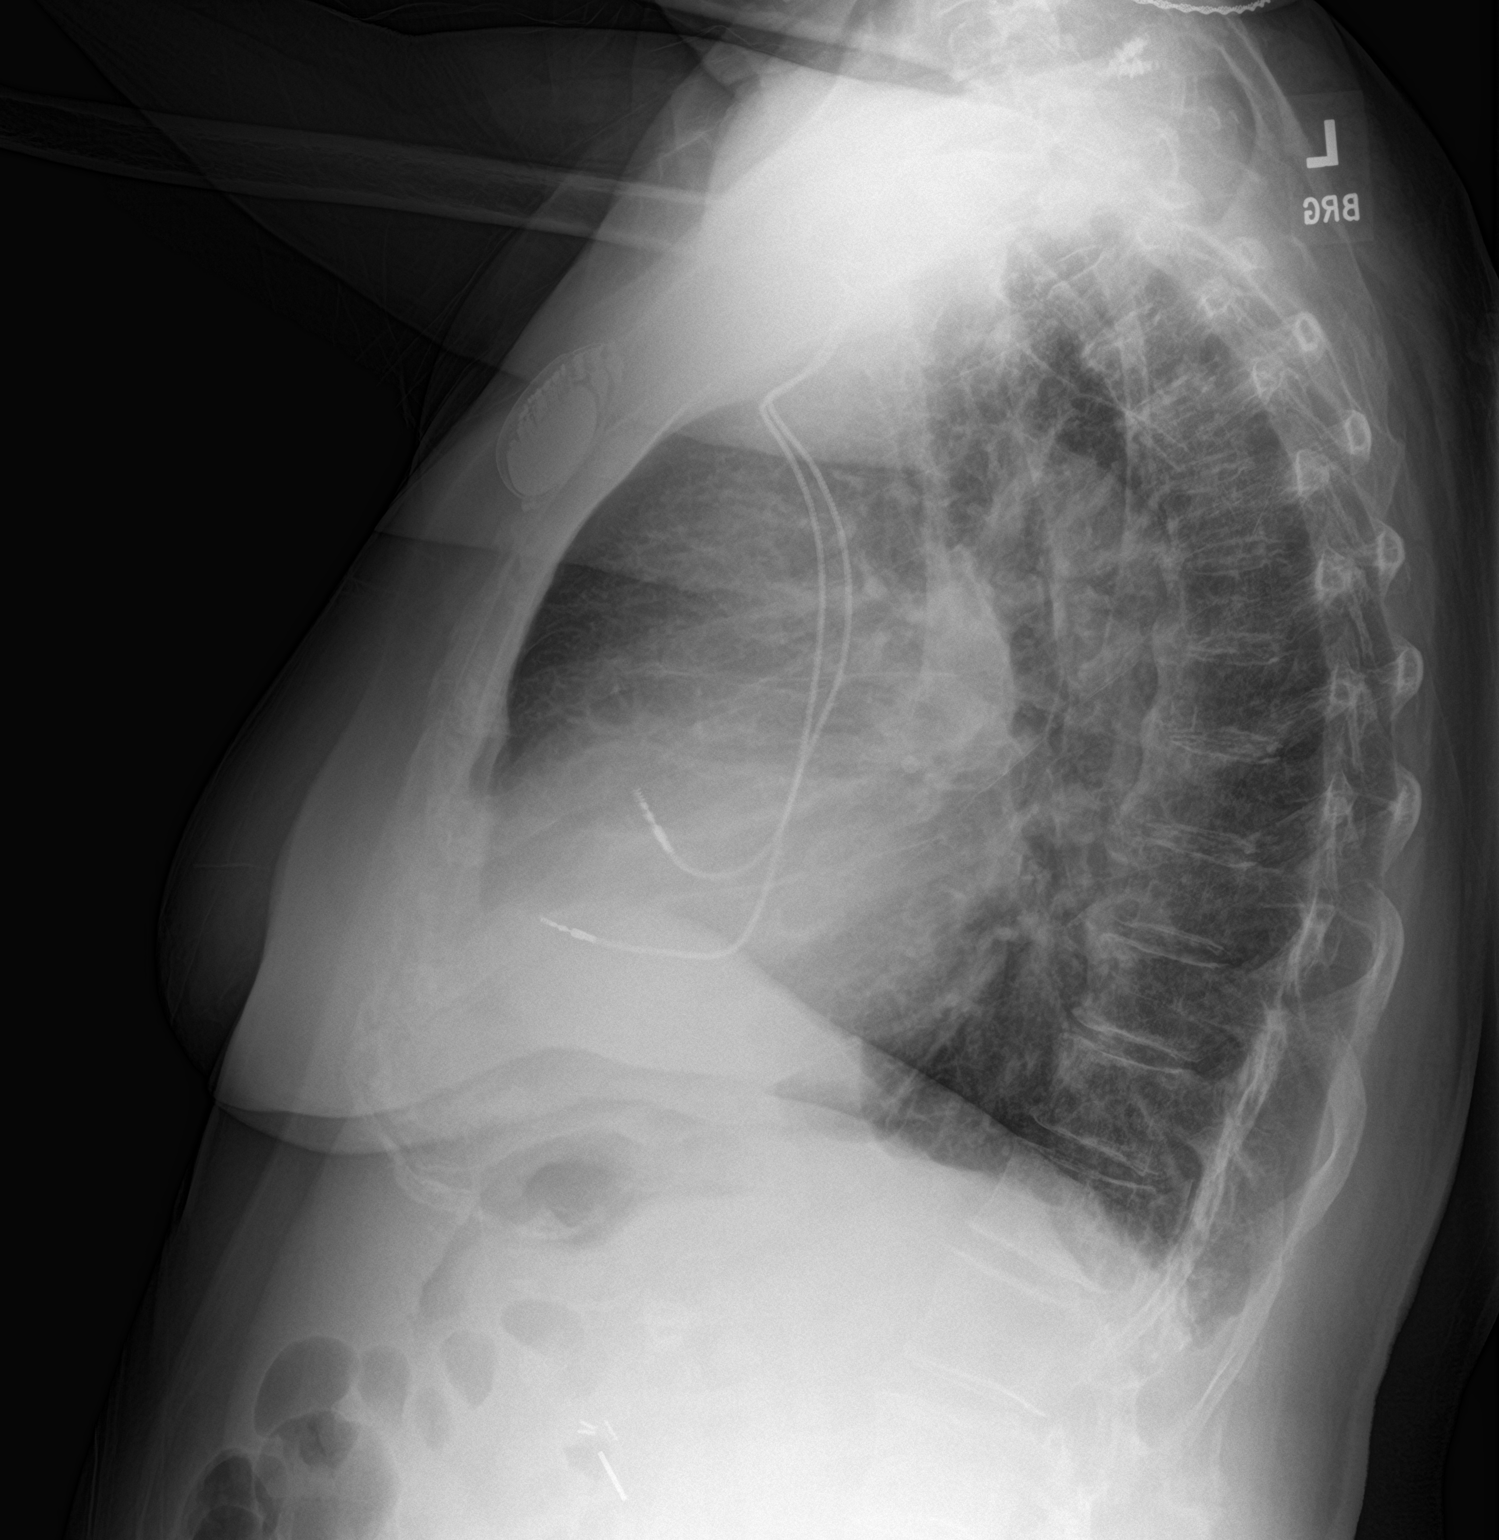

[2 of 2 positions shown; findings below may reference images not displayed]

FINDINGS: Lung volumes are normal. No consolidative airspace disease. No
pleural effusions. No pneumothorax. Cephalization of the pulmonary
vasculature, without frank pulmonary edema. Mild cardiomegaly. Upper
mediastinal contours are within normal limits. Atherosclerotic
calcifications in the thoracic aorta. Left-sided pacemaker device in
place with lead tips projecting over the expected location of the
right atrium and right ventricle. Surgical clips project over the
right upper quadrant of the abdomen, likely from prior
cholecystectomy. Soft tissue anchors are also noted in the humeral
heads bilaterally, likely from prior rotator cuff repair.
IMPRESSION: 1. Cardiomegaly with pulmonary venous congestion, but no frank
pulmonary edema.
2. Aortic atherosclerosis.

## 2022-08-05 SURGERY — COLONOSCOPY WITH PROPOFOL
Anesthesia: General

## 2022-08-05 MED ORDER — LACTATED RINGERS IV SOLN: INTRAVENOUS | Status: DC | PRN

## 2022-08-05 MED ORDER — PROPOFOL 500 MG/50ML IV EMUL
INTRAVENOUS | Status: DC | PRN
  Administered 2022-08-05: 150 ug/kg/min via INTRAVENOUS

## 2022-08-05 MED ORDER — LIDOCAINE HCL (CARDIAC) PF 100 MG/5ML IV SOSY
PREFILLED_SYRINGE | INTRAVENOUS | Status: DC | PRN
  Administered 2022-08-05: 50 mg via INTRAVENOUS

## 2022-08-05 MED ORDER — PHENYLEPHRINE 80 MCG/ML (10ML) SYRINGE FOR IV PUSH (FOR BLOOD PRESSURE SUPPORT)
PREFILLED_SYRINGE | INTRAVENOUS | Status: DC | PRN
  Administered 2022-08-05: 160 ug via INTRAVENOUS

## 2022-08-05 MED ORDER — PROPOFOL 10 MG/ML IV BOLUS
INTRAVENOUS | Status: DC | PRN
  Administered 2022-08-05: 40 mg via INTRAVENOUS
  Administered 2022-08-05: 100 mg via INTRAVENOUS

## 2022-08-05 NOTE — Telephone Encounter (Signed)
Attempted to contact patient to assist with sending transmission. No answer, LMTCB.

## 2022-08-05 NOTE — Anesthesia Procedure Notes (Signed)
Date/Time: 08/05/2022 10:54 AM  Performed by: Orlie Dakin, CRNAPre-anesthesia Checklist: Patient identified, Emergency Drugs available, Suction available and Patient being monitored Patient Re-evaluated:Patient Re-evaluated prior to induction Oxygen Delivery Method: Nasal cannula Induction Type: IV induction Placement Confirmation: positive ETCO2

## 2022-08-05 NOTE — Transfer of Care (Signed)
Immediate Anesthesia Transfer of Care Note  Patient: Alicia Nash  Procedure(s) Performed: COLONOSCOPY WITH PROPOFOL ESOPHAGOGASTRODUODENOSCOPY (EGD) WITH PROPOFOL HOT HEMOSTASIS (ARGON PLASMA COAGULATION/BICAP) POLYPECTOMY INTESTINAL  Patient Location: Short Stay  Anesthesia Type:General  Level of Consciousness: drowsy  Airway & Oxygen Therapy: Patient Spontanous Breathing  Post-op Assessment: Report given to RN and Post -op Vital signs reviewed and stable  Post vital signs: Reviewed and stable  Last Vitals:  Vitals Value Taken Time  BP    Temp    Pulse    Resp    SpO2      Last Pain:  Vitals:   08/05/22 1048  TempSrc:   PainSc: 0-No pain      Patients Stated Pain Goal: 6 ([id number])  Complications: No notable events documented.

## 2022-08-05 NOTE — Op Note (Signed)
[facility] Patient Name: Alicia Nash Procedure Date: 08/05/2022 10:38 AM MRN: [id number] Date of Birth: [birth year] Attending MD: Maylon Peppers , , [id number] CSN: [id number] Age: 82 Admit Type: Outpatient Procedure:                Colonoscopy Indications:              Iron deficiency anemia Providers:                Maylon Peppers, Crystal Page, Casimer Bilis, Technician Referring MD:              Medicines:                Monitored Anesthesia Care Complications:            No immediate complications. Estimated Blood Loss:     Estimated blood loss: none. Procedure:                Pre-Anesthesia Assessment:                           - Prior to the procedure, a History and Physical                            was performed, and patient medications, allergies                            and sensitivities were reviewed. The patient's                            tolerance of previous anesthesia was reviewed.                           - The risks and benefits of the procedure and the                            sedation options and risks were discussed with the                            patient. All questions were answered and informed                            consent was obtained.                           - ASA Grade Assessment: III - A patient with severe                            systemic disease.                           After obtaining informed consent, the colonoscope                            was passed under direct vision. Throughout the  procedure, the patient's blood pressure, pulse, and                            oxygen saturations were monitored continuously. The                            [id number]) was introduced through the                            anus and advanced to the the terminal ileum. The                            colonoscopy was performed without difficulty. The                             patient tolerated the procedure well. The quality                            of the bowel preparation was good. Scope In: 11:10:50 AM Scope Out: 11:33:34 AM Scope Withdrawal Time: 0 hours 18 minutes 35 seconds  Total Procedure Duration: 0 hours 22 minutes 44 seconds  Findings:      The perianal and digital rectal examinations were normal.      A 1 mm polyp was found in the cecum. The polyp was sessile. The polyp       was removed with a cold biopsy forceps. Resection and retrieval were       complete.      Two sessile polyps were found in the descending colon. The polyps were 3       to 5 mm in size. These polyps were removed with a cold snare. Resection       and retrieval were complete.      Scattered large-mouthed and small-mouthed diverticula were found in the       entire colon.      The retroflexed view of the distal rectum and anal verge was normal and       showed no anal or rectal abnormalities. Impression:               - One 1 mm polyp in the cecum, removed with a cold                            biopsy forceps. Resected and retrieved.                           - Two 3 to 5 mm polyps in the descending colon,                            removed with a cold snare. Resected and retrieved.                           - Diverticulosis in the entire examined colon.                           - The distal rectum and anal verge are normal on  retroflexion view. Moderate Sedation:      Per Anesthesia Care Recommendation:           - Discharge patient to home (ambulatory).                           - Resume previous diet.                           - Await pathology results.                           - Repeat colonoscopy is not recommended due to                            current age (81 years or older) for screening                            purposes.                           - Follow up in GI clinic as scheduled, if worsening                             anemia, will consider capsule endoscopy. Procedure Code(s):        --- Professional ---                           [phone], Colonoscopy, flexible; with removal of                            tumor(s), polyp(s), or other lesion(s) by snare                            technique                           [id number], 36, Colonoscopy, flexible; with biopsy,                            single or multiple Diagnosis Code(s):        --- Professional ---                           D12.0, Benign neoplasm of cecum                           D12.4, Benign neoplasm of descending colon                           D50.9, Iron deficiency anemia, unspecified                           K57.30, Diverticulosis of large intestine without                            perforation or abscess without bleeding CPT copyright 2022 American Medical Association. All  rights reserved. The codes documented in this report are preliminary and upon coder review may  be revised to meet current compliance requirements. Maylon Peppers, MD Maylon Peppers,  08/05/2022 11:43:30 AM This report has been signed electronically. Number of Addenda: 0

## 2022-08-05 NOTE — Anesthesia Preprocedure Evaluation (Signed)
Anesthesia Evaluation  Patient identified by MRN, date of birth, ID band Patient awake    Reviewed: Allergy & Precautions, H&P , NPO status , Patient's Chart, lab work & pertinent test results, reviewed documented beta blocker date and time   Airway Mallampati: II  TM Distance: >3 FB Neck ROM: full    Dental no notable dental hx.    Pulmonary neg pulmonary ROS   Pulmonary exam normal breath sounds clear to auscultation       Cardiovascular Exercise Tolerance: Good hypertension, pulmonary hypertension+ DOE  + dysrhythmias Atrial Fibrillation + pacemaker  Rhythm:regular Rate:Normal     Neuro/Psych negative neurological ROS  negative psych ROS   GI/Hepatic negative GI ROS, Neg liver ROS,,,  Endo/Other  negative endocrine ROS    Renal/GU Renal diseasenegative Renal ROS  negative genitourinary   Musculoskeletal   Abdominal   Peds  Hematology negative hematology ROS (+) Blood dyscrasia, anemia   Anesthesia Other Findings   Reproductive/Obstetrics negative OB ROS                             Anesthesia Physical Anesthesia Plan  ASA: 3  Anesthesia Plan: General   Post-op Pain Management:    Induction:   PONV Risk Score and Plan: Propofol infusion  Airway Management Planned:   Additional Equipment:   Intra-op Plan:   Post-operative Plan:   Informed Consent: I have reviewed the patients History and Physical, chart, labs and discussed the procedure including the risks, benefits and alternatives for the proposed anesthesia with the patient or authorized representative who has indicated his/her understanding and acceptance.     Dental Advisory Given  Plan Discussed with: CRNA  Anesthesia Plan Comments:        Anesthesia Quick Evaluation

## 2022-08-05 NOTE — Op Note (Signed)
[facility] Patient Name: Alicia Nash Procedure Date: 08/05/2022 10:43 AM MRN: [id number] Date of Birth: 15-Jan-1941 Attending MD: Maylon Peppers , , [id number] CSN: [id number] Age: 82 Admit Type: Outpatient Procedure:                Upper GI endoscopy Indications:              Iron deficiency anemia Providers:                Maylon Peppers, Crystal Page, Casimer Bilis, Technician Referring MD:              Medicines:                Monitored Anesthesia Care Complications:            No immediate complications. Estimated Blood Loss:     Estimated blood loss: none. Procedure:                Pre-Anesthesia Assessment:                           - Prior to the procedure, a History and Physical                            was performed, and patient medications, allergies                            and sensitivities were reviewed. The patient's                            tolerance of previous anesthesia was reviewed.                           - The risks and benefits of the procedure and the                            sedation options and risks were discussed with the                            patient. All questions were answered and informed                            consent was obtained.                           - ASA Grade Assessment: III - A patient with severe                            systemic disease.                           After obtaining informed consent, the endoscope was                            passed under direct vision. Throughout the  procedure, the patient's blood pressure, pulse, and                            oxygen saturations were monitored continuously. The                            [id number]) scope was introduced through the                            mouth, and advanced to the third part of duodenum.                            The upper GI endoscopy was accomplished without                             difficulty. The patient tolerated the procedure                            well. Scope In: 10:53:21 AM Scope Out: 11:04:49 AM Total Procedure Duration: 0 hours 11 minutes 28 seconds  Findings:      The esophagus was normal.      Mild gastric antral vascular ectasia without bleeding was present in the       gastric antrum. Coagulation for bleeding prevention using argon plasma       at 0.3 liters/minute and 20 watts was successful.      The examined duodenum was normal. Impression:               - Normal esophagus.                           - Gastric antral vascular ectasia without bleeding.                            Treated with argon plasma coagulation (APC).                           - Normal examined duodenum.                           - No specimens collected. Moderate Sedation:      Per Anesthesia Care Recommendation:           - Discharge patient to home (ambulatory).                           - Resume previous diet.                           - Continue present medications.                           - Resume Eliquis (apixaban) at prior dose tomorrow. Procedure Code(s):        --- Professional ---                           [phone], Esophagogastroduodenoscopy, flexible,  transoral; with control of bleeding, any method Diagnosis Code(s):        --- Professional ---                           [id number], Angiodysplasia of stomach and duodenum                            without bleeding                           D50.9, Iron deficiency anemia, unspecified CPT copyright 2022 American Medical Association. All rights reserved. The codes documented in this report are preliminary and upon coder review may  be revised to meet current compliance requirements. Maylon Peppers, MD Maylon Peppers,  08/05/2022 11:34:40 AM This report has been signed electronically. Number of Addenda: 0

## 2022-08-05 NOTE — Discharge Instructions (Addendum)
You are being discharged to home.  Resume your previous diet.  Continue your present medications.  Resume taking Eliquis (apixaban) at your prior dose tomorrow.  We are waiting for your pathology results.  Your physician has indicated that a repeat colonoscopy is not recommended due to your current age (40 years or older) for screening purposes.  Follow up in GI clinic as scheduled, if worsening anemia, will consider capsule endoscopy.

## 2022-08-05 NOTE — Interval H&P Note (Signed)
History and Physical Interval Note:  08/05/2022 9:05 AM  Alicia Nash  has presented today for surgery, with the diagnosis of IDA, heme positive stool.  The various methods of treatment have been discussed with the patient and family. After consideration of risks, benefits and other options for treatment, the patient has consented to  Procedure(s) with comments: COLONOSCOPY WITH PROPOFOL (N/A) - 10:30am, asa 3 ESOPHAGOGASTRODUODENOSCOPY (EGD) WITH PROPOFOL (N/A) as a surgical intervention.  The patient's history has been reviewed, patient examined, no change in status, stable for surgery.  I have reviewed the patient's chart and labs.  Questions were answered to the patient's satisfaction.     Maylon Peppers Mayorga

## 2022-08-06 LAB — SURGICAL PATHOLOGY

## 2022-08-06 NOTE — Telephone Encounter (Signed)
Letter sent 08/06/2022.

## 2022-08-07 NOTE — Anesthesia Postprocedure Evaluation (Signed)
Anesthesia Post Note  Patient: SHRITHA HEIDEMAN  Procedure(s) Performed: COLONOSCOPY WITH PROPOFOL ESOPHAGOGASTRODUODENOSCOPY (EGD) WITH PROPOFOL HOT HEMOSTASIS (ARGON PLASMA COAGULATION/BICAP) POLYPECTOMY INTESTINAL  Patient location during evaluation: Phase II Anesthesia Type: General Level of consciousness: awake Pain management: pain level controlled Vital Signs Assessment: post-procedure vital signs reviewed and stable Respiratory status: spontaneous breathing and respiratory function stable Cardiovascular status: blood pressure returned to baseline and stable Postop Assessment: no headache and no apparent nausea or vomiting Anesthetic complications: no Comments: Late entry   No notable events documented.   Last Vitals:  Vitals:   08/05/22 0843 08/05/22 1138  BP: (!) 152/52 (!) 123/49  Pulse: 60 60  Resp: 18 14  Temp: 36.4 C (!) 36.2 C  SpO2: 100% 94%    Last Pain:  Vitals:   08/05/22 1138  TempSrc: Axillary  PainSc: 0-No pain                 Louann Sjogren

## 2022-08-11 ENCOUNTER — Other Ambulatory Visit: Payer: Self-pay | Admitting: Hematology

## 2022-08-11 ENCOUNTER — Other Ambulatory Visit: Payer: Self-pay | Admitting: *Deleted

## 2022-08-11 DIAGNOSIS — C50919 Malignant neoplasm of unspecified site of unspecified female breast: Secondary | ICD-10-CM

## 2022-08-11 IMAGING — US US EXTREM LOW VENOUS
1 series · 13 of 24 positions shown · non-contrast
Comparison: None.

CLINICAL DATA: 80-year-old female with a history prior ankle
fracture and chronic lower extremity edema



[Series 1: us venous img lower bilat (dvt) · portal-venous · 13 of 72 slices shown]
[im 1/72]
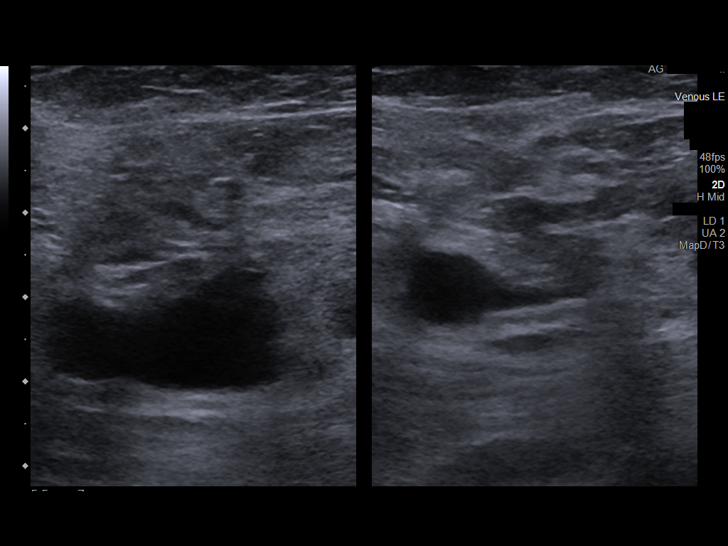
[im 7/72]
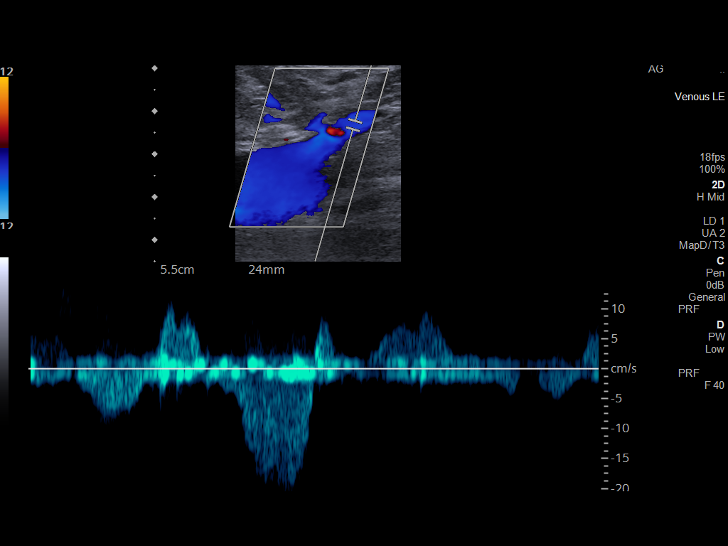
[im 13/72]
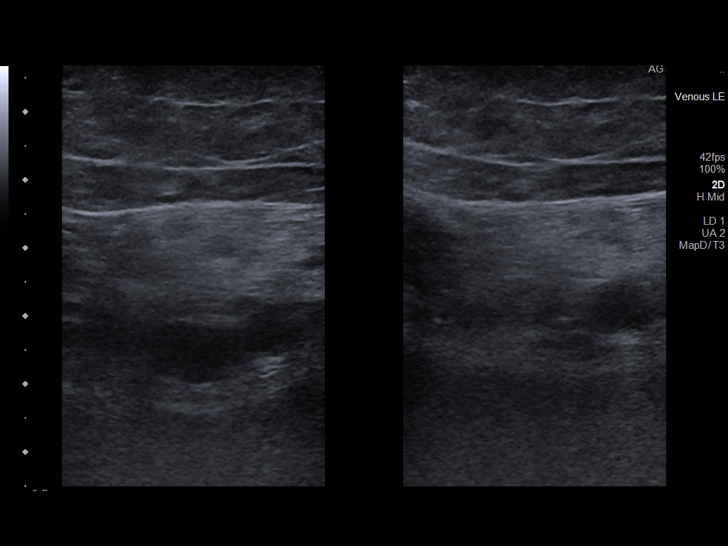
[im 19/72]
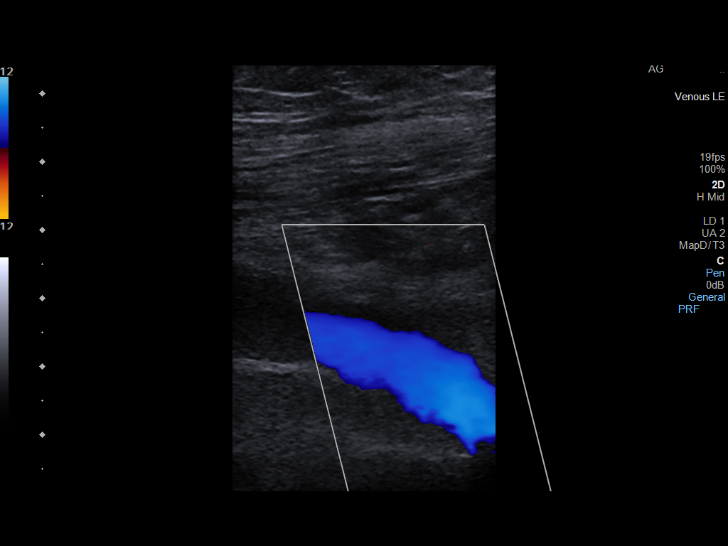
[im 25/72]
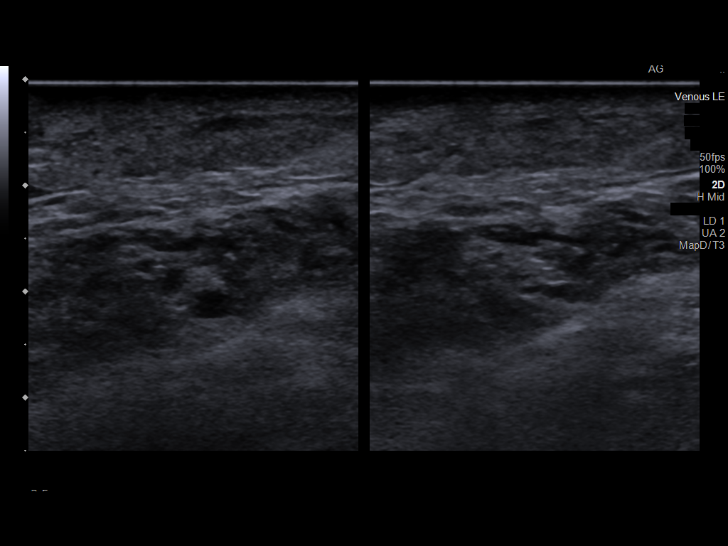
[im 31/72]
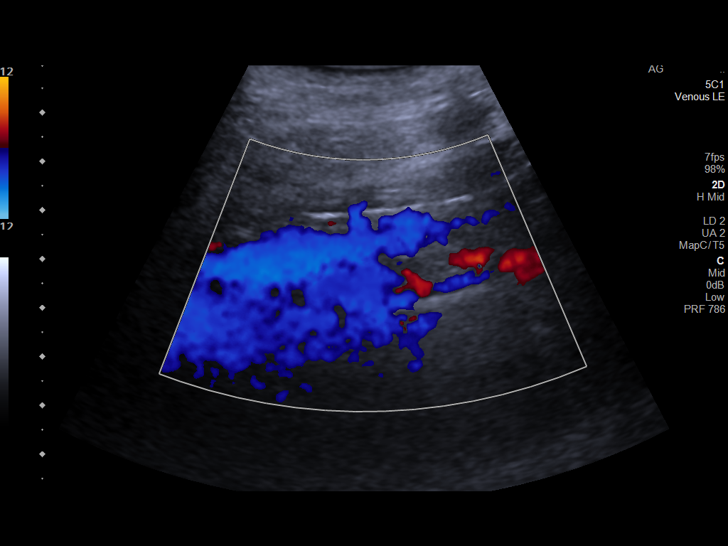
[im 38/72]
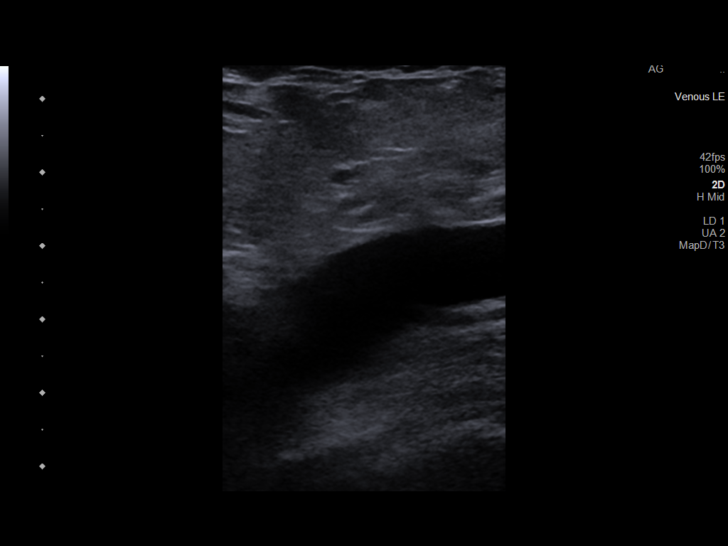
[im 41/72]
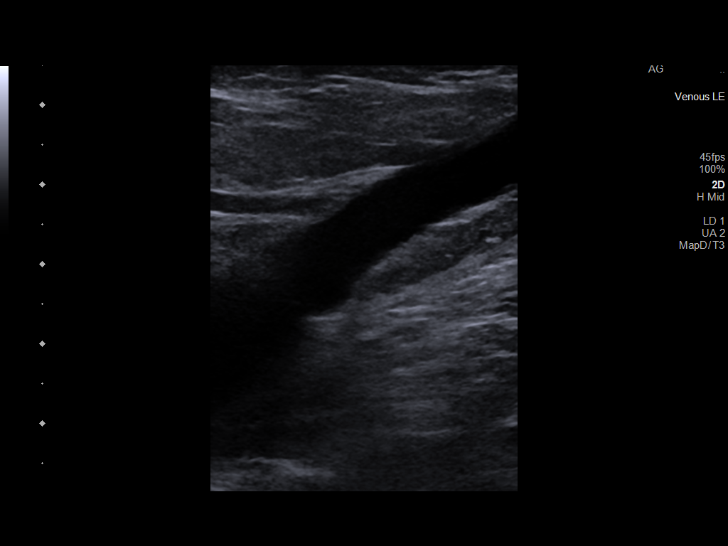
[im 47/72]
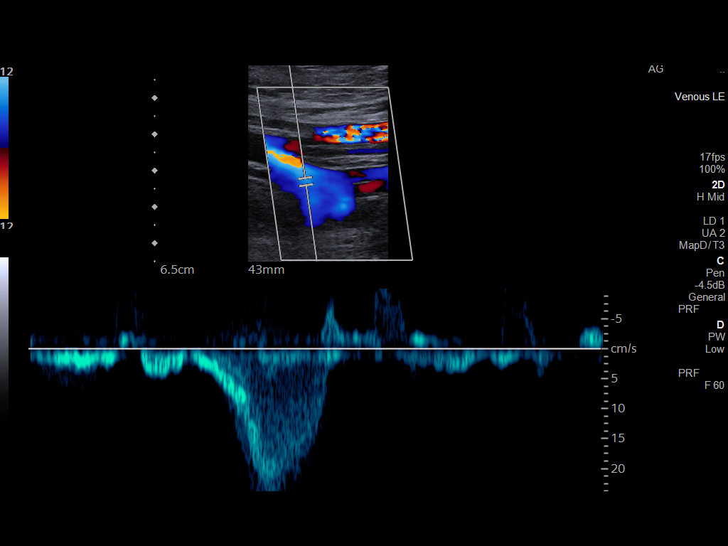
[im 53/72]
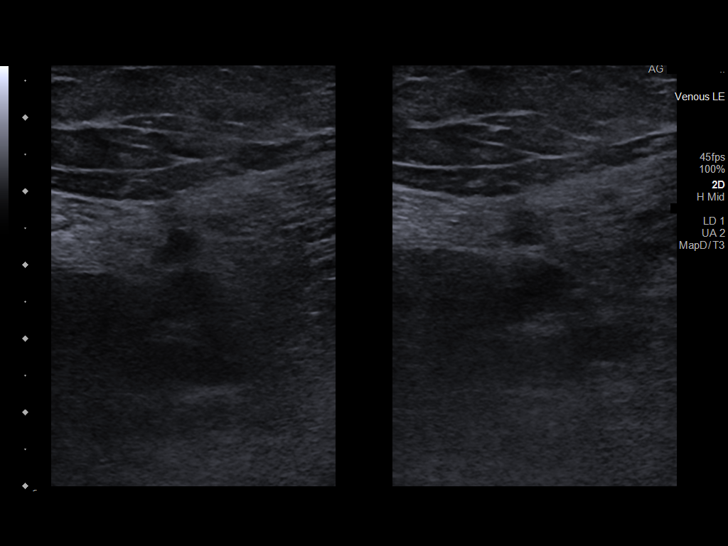
[im 59/72]
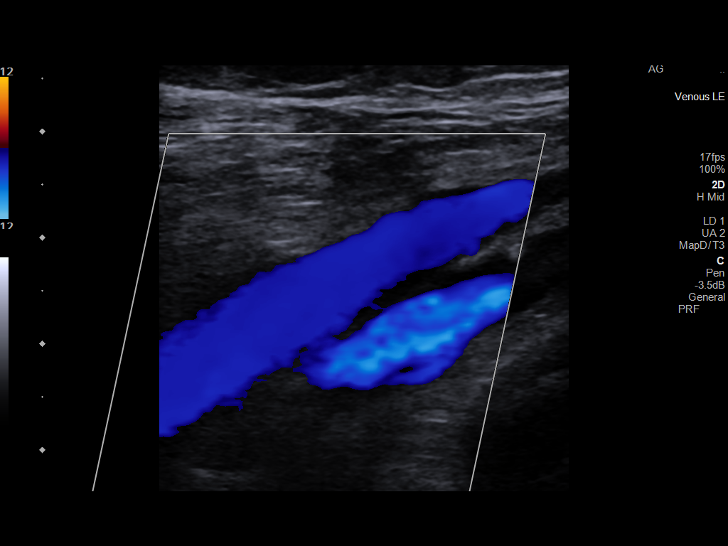
[im 65/72]
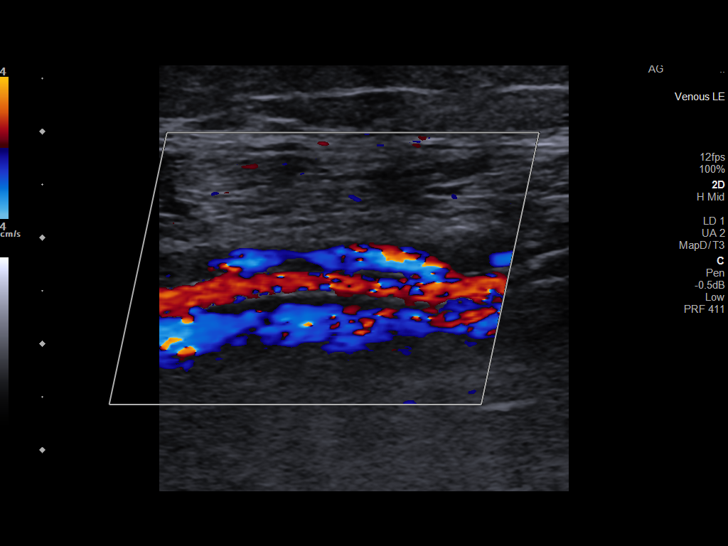
[im 72/72]
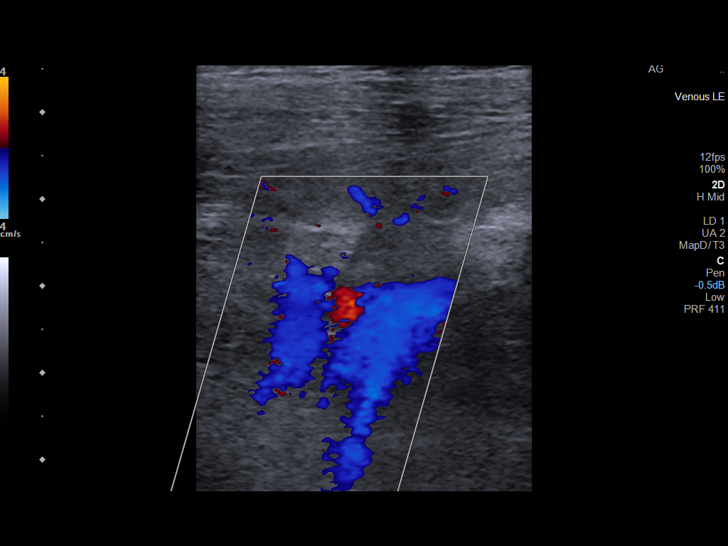

[13 of 24 positions shown; findings below may reference images not displayed]

FINDINGS: RIGHT LOWER EXTREMITY

Common Femoral Vein: No evidence of thrombus. Normal
compressibility, respiratory phasicity and response to augmentation.

Saphenofemoral Junction: No evidence of thrombus. Normal
compressibility and flow on color Doppler imaging.

Profunda Femoral Vein: No evidence of thrombus. Normal
compressibility and flow on color Doppler imaging.

Femoral Vein: No evidence of thrombus. Normal compressibility,
respiratory phasicity and response to augmentation.

Popliteal Vein: No evidence of thrombus. Normal compressibility,
respiratory phasicity and response to augmentation.

Calf Veins: No evidence of thrombus. Normal compressibility and flow
on color Doppler imaging.

Superficial Great Saphenous Vein: No evidence of thrombus. Normal
compressibility and flow on color Doppler imaging.

Other Findings:  None.

LEFT LOWER EXTREMITY

Common Femoral Vein: No evidence of thrombus. Normal
compressibility, respiratory phasicity and response to augmentation.

Saphenofemoral Junction: No evidence of thrombus. Normal
compressibility and flow on color Doppler imaging.

Profunda Femoral Vein: No evidence of thrombus. Normal
compressibility and flow on color Doppler imaging.

Femoral Vein: No evidence of thrombus. Normal compressibility,
respiratory phasicity and response to augmentation.

Popliteal Vein: No evidence of thrombus. Normal compressibility,
respiratory phasicity and response to augmentation.

Calf Veins: No evidence of thrombus. Normal compressibility and flow
on color Doppler imaging.

Superficial Great Saphenous Vein: No evidence of thrombus. Normal
compressibility and flow on color Doppler imaging.

Other Findings:  None.
IMPRESSION: Sonographic survey of the bilateral lower extremities negative for
DVT.

## 2022-08-11 NOTE — Telephone Encounter (Signed)
Refill approved for Anastrozole.  Patient is tolerating and is to continue therapy. 

## 2022-08-12 ENCOUNTER — Encounter (HOSPITAL_COMMUNITY): Payer: Self-pay | Admitting: Gastroenterology

## 2022-08-15 ENCOUNTER — Ambulatory Visit (INDEPENDENT_AMBULATORY_CARE_PROVIDER_SITE_OTHER): Payer: Medicare Other

## 2022-08-15 DIAGNOSIS — I442 Atrioventricular block, complete: Secondary | ICD-10-CM

## 2022-08-20 LAB — CUP PACEART REMOTE DEVICE CHECK
Battery Impedance: 6013 Ohm
Battery Remaining Longevity: 1 mo — CL
Battery Voltage: 2.6 V
Brady Statistic AP VP Percent: 44 %
Brady Statistic AP VS Percent: 0 %
Brady Statistic AS VP Percent: 56 %
Brady Statistic AS VS Percent: 0 %
Date Time Interrogation Session: 20240416172905
Implantable Lead Connection Status: 753985
Implantable Lead Connection Status: 753985
Implantable Lead Implant Date: 20150108
Implantable Lead Implant Date: 20150108
Implantable Lead Location: 753859
Implantable Lead Location: 753860
Implantable Lead Model: 5076
Implantable Lead Model: 5076
Implantable Pulse Generator Implant Date: 20150108
Lead Channel Impedance Value: 415 Ohm
Lead Channel Impedance Value: 446 Ohm
Lead Channel Pacing Threshold Amplitude: 0.5 V
Lead Channel Pacing Threshold Amplitude: 1.25 V
Lead Channel Pacing Threshold Pulse Width: 0.4 ms
Lead Channel Pacing Threshold Pulse Width: 0.4 ms
Lead Channel Setting Pacing Amplitude: 2.5 V
Lead Channel Setting Pacing Amplitude: 2.75 V
Lead Channel Setting Pacing Pulse Width: 0.4 ms
Lead Channel Setting Sensing Sensitivity: 2 mV
Zone Setting Status: 755011
Zone Setting Status: 755011

## 2022-09-01 DIAGNOSIS — A881 Epidemic vertigo: Secondary | ICD-10-CM | POA: Diagnosis not present

## 2022-09-01 DIAGNOSIS — R5383 Other fatigue: Secondary | ICD-10-CM | POA: Diagnosis not present

## 2022-09-01 DIAGNOSIS — H81399 Other peripheral vertigo, unspecified ear: Secondary | ICD-10-CM | POA: Diagnosis not present

## 2022-09-15 ENCOUNTER — Ambulatory Visit (INDEPENDENT_AMBULATORY_CARE_PROVIDER_SITE_OTHER): Payer: Medicare Other

## 2022-09-15 DIAGNOSIS — I442 Atrioventricular block, complete: Secondary | ICD-10-CM | POA: Diagnosis not present

## 2022-09-16 ENCOUNTER — Telehealth: Payer: Self-pay

## 2022-09-16 DIAGNOSIS — I442 Atrioventricular block, complete: Secondary | ICD-10-CM

## 2022-09-16 LAB — CUP PACEART REMOTE DEVICE CHECK
Battery Impedance: 7355 Ohm
Battery Voltage: 2.63 V
Brady Statistic RV Percent Paced: 100 %
Date Time Interrogation Session: 20240514090447
Implantable Lead Connection Status: 753985
Implantable Lead Connection Status: 753985
Implantable Lead Implant Date: 20150108
Implantable Lead Implant Date: 20150108
Implantable Lead Location: 753859
Implantable Lead Location: 753860
Implantable Lead Model: 5076
Implantable Lead Model: 5076
Implantable Pulse Generator Implant Date: 20150108
Lead Channel Impedance Value: 422 Ohm
Lead Channel Impedance Value: 67 Ohm
Lead Channel Setting Pacing Amplitude: 2.5 V
Lead Channel Setting Pacing Pulse Width: 0.4 ms
Lead Channel Setting Sensing Sensitivity: 2 mV
Zone Setting Status: 755011
Zone Setting Status: 755011

## 2022-09-16 NOTE — Telephone Encounter (Signed)
Pacemaker reached RRT 09/02/22. Per last OV note, procedure was already discussed. Not sure if she needs another apt since it has been several months or ok to schedule.   Routing to Lobbyist and Sales promotion account executive.

## 2022-09-18 NOTE — Addendum Note (Signed)
Addended by: Elease Etienne A on: 09/18/2022 09:33 AM   Modules accepted: Level of Service

## 2022-09-18 NOTE — Progress Notes (Signed)
Remote pacemaker transmission.   

## 2022-09-18 NOTE — Addendum Note (Signed)
Addended by: Cleda Mccreedy on: 09/18/2022 10:25 AM   Modules accepted: Orders

## 2022-09-18 NOTE — Telephone Encounter (Signed)
Pt is scheduled for a PPM Gen change on 6/4 @ 12:00.   She will go to [facility] on 5/28 for labs.   She is going to go by the [place] office to pick up her surgical scrub.   I gave her medication instructions and I will mail her instruction letter to her.

## 2022-09-22 DIAGNOSIS — I10 Essential (primary) hypertension: Secondary | ICD-10-CM | POA: Diagnosis not present

## 2022-09-22 DIAGNOSIS — R0602 Shortness of breath: Secondary | ICD-10-CM | POA: Diagnosis not present

## 2022-09-22 DIAGNOSIS — J40 Bronchitis, not specified as acute or chronic: Secondary | ICD-10-CM | POA: Diagnosis not present

## 2022-09-25 DIAGNOSIS — Z853 Personal history of malignant neoplasm of breast: Secondary | ICD-10-CM | POA: Diagnosis not present

## 2022-09-25 DIAGNOSIS — R7989 Other specified abnormal findings of blood chemistry: Secondary | ICD-10-CM | POA: Diagnosis not present

## 2022-09-25 DIAGNOSIS — R0789 Other chest pain: Secondary | ICD-10-CM | POA: Diagnosis not present

## 2022-09-25 DIAGNOSIS — R0602 Shortness of breath: Secondary | ICD-10-CM | POA: Diagnosis not present

## 2022-09-25 DIAGNOSIS — I5033 Acute on chronic diastolic (congestive) heart failure: Secondary | ICD-10-CM | POA: Diagnosis not present

## 2022-09-25 DIAGNOSIS — Z79899 Other long term (current) drug therapy: Secondary | ICD-10-CM | POA: Diagnosis not present

## 2022-09-25 DIAGNOSIS — R079 Chest pain, unspecified: Secondary | ICD-10-CM | POA: Diagnosis not present

## 2022-09-25 DIAGNOSIS — Z7901 Long term (current) use of anticoagulants: Secondary | ICD-10-CM | POA: Diagnosis not present

## 2022-09-25 DIAGNOSIS — N19 Unspecified kidney failure: Secondary | ICD-10-CM | POA: Diagnosis not present

## 2022-09-25 DIAGNOSIS — I110 Hypertensive heart disease with heart failure: Secondary | ICD-10-CM | POA: Diagnosis not present

## 2022-09-25 DIAGNOSIS — C50919 Malignant neoplasm of unspecified site of unspecified female breast: Secondary | ICD-10-CM | POA: Diagnosis not present

## 2022-09-25 DIAGNOSIS — Z9011 Acquired absence of right breast and nipple: Secondary | ICD-10-CM | POA: Diagnosis not present

## 2022-09-25 DIAGNOSIS — Z950 Presence of cardiac pacemaker: Secondary | ICD-10-CM | POA: Diagnosis not present

## 2022-09-25 DIAGNOSIS — M1990 Unspecified osteoarthritis, unspecified site: Secondary | ICD-10-CM | POA: Diagnosis not present

## 2022-09-25 DIAGNOSIS — I509 Heart failure, unspecified: Secondary | ICD-10-CM | POA: Diagnosis not present

## 2022-09-25 DIAGNOSIS — Z79811 Long term (current) use of aromatase inhibitors: Secondary | ICD-10-CM | POA: Diagnosis not present

## 2022-09-30 ENCOUNTER — Other Ambulatory Visit (HOSPITAL_COMMUNITY)
Admission: RE | Admit: 2022-09-30 | Discharge: 2022-09-30 | Disposition: A | Discharge status: Home / Self Care | Payer: Medicare Other | Source: Ambulatory Visit | Attending: Cardiovascular Disease | Admitting: Cardiovascular Disease

## 2022-09-30 DIAGNOSIS — I5022 Chronic systolic (congestive) heart failure: Secondary | ICD-10-CM | POA: Diagnosis not present

## 2022-09-30 DIAGNOSIS — I509 Heart failure, unspecified: Secondary | ICD-10-CM | POA: Diagnosis not present

## 2022-09-30 DIAGNOSIS — I442 Atrioventricular block, complete: Secondary | ICD-10-CM | POA: Diagnosis not present

## 2022-09-30 DIAGNOSIS — I10 Essential (primary) hypertension: Secondary | ICD-10-CM | POA: Diagnosis not present

## 2022-09-30 LAB — CBC
HCT: 36.7 % (ref 36.0–46.0)
Hemoglobin: 11.5 g/dL — ABNORMAL LOW (ref 12.0–15.0)
MCH: 28.8 pg (ref 26.0–34.0)
MCHC: 31.3 g/dL (ref 30.0–36.0)
MCV: 91.8 fL (ref 80.0–100.0)
Platelets: 221 10*3/uL (ref 150–400)
RBC: 4 MIL/uL (ref 3.87–5.11)
RDW: 15.2 % (ref 11.5–15.5)
WBC: 4.2 10*3/uL (ref 4.0–10.5)
nRBC: 0 % (ref 0.0–0.2)

## 2022-09-30 LAB — BASIC METABOLIC PANEL
Anion gap: 16 — ABNORMAL HIGH (ref 5–15)
BUN: 42 mg/dL — ABNORMAL HIGH (ref 8–23)
CO2: 21 mmol/L — ABNORMAL LOW (ref 22–32)
Calcium: 9.2 mg/dL (ref 8.9–10.3)
Chloride: 100 mmol/L (ref 98–111)
Creatinine, Ser: 1.44 mg/dL — ABNORMAL HIGH (ref 0.44–1.00)
GFR, Estimated: 36 mL/min — ABNORMAL LOW (ref >60)
Glucose, Bld: 119 mg/dL — ABNORMAL HIGH (ref 70–99)
Potassium: 3.9 mmol/L (ref 3.5–5.1)
Sodium: 137 mmol/L (ref 135–145)

## 2022-10-06 NOTE — Pre-Procedure Instructions (Addendum)
Instructed patient on the following items: Arrival time 1000 Nothing to eat or drink after midnight No meds AM of procedure Responsible person to drive you home and stay with you for 24 hrs Wash with special soap night before and morning of procedure If on anti-coagulant drug instructions Eliquis- last dose Sunday 6/2.

## 2022-10-07 ENCOUNTER — Other Ambulatory Visit: Payer: Self-pay

## 2022-10-07 ENCOUNTER — Ambulatory Visit (HOSPITAL_COMMUNITY)
Admission: RE | Admit: 2022-10-07 | Discharge: 2022-10-07 | Disposition: A | Discharge status: Home / Self Care | Payer: Medicare Other | Attending: Cardiovascular Disease | Admitting: Cardiovascular Disease

## 2022-10-07 ENCOUNTER — Ambulatory Visit (HOSPITAL_COMMUNITY)
Admission: RE | Disposition: A | Discharge status: Home / Self Care | Payer: Self-pay | Source: Home / Self Care | Attending: Cardiovascular Disease

## 2022-10-07 DIAGNOSIS — I480 Paroxysmal atrial fibrillation: Secondary | ICD-10-CM | POA: Insufficient documentation

## 2022-10-07 DIAGNOSIS — I110 Hypertensive heart disease with heart failure: Secondary | ICD-10-CM | POA: Insufficient documentation

## 2022-10-07 DIAGNOSIS — R001 Bradycardia, unspecified: Secondary | ICD-10-CM | POA: Diagnosis not present

## 2022-10-07 DIAGNOSIS — I442 Atrioventricular block, complete: Secondary | ICD-10-CM | POA: Insufficient documentation

## 2022-10-07 DIAGNOSIS — Z7901 Long term (current) use of anticoagulants: Secondary | ICD-10-CM | POA: Insufficient documentation

## 2022-10-07 DIAGNOSIS — Z45010 Encounter for checking and testing of cardiac pacemaker pulse generator [battery]: Secondary | ICD-10-CM

## 2022-10-07 DIAGNOSIS — I5032 Chronic diastolic (congestive) heart failure: Secondary | ICD-10-CM | POA: Diagnosis not present

## 2022-10-07 HISTORY — PX: PPM GENERATOR CHANGEOUT: EP1233

## 2022-10-07 SURGERY — PPM GENERATOR CHANGEOUT

## 2022-10-07 MED ORDER — LIDOCAINE HCL (PF) 1 % IJ SOLN
INTRAMUSCULAR | Status: DC | PRN
  Administered 2022-10-07: 60 mL

## 2022-10-07 MED ORDER — SODIUM CHLORIDE 0.9 % IV SOLN: INTRAVENOUS | Status: DC

## 2022-10-07 MED ORDER — LIDOCAINE HCL (PF) 1 % IJ SOLN
INTRAMUSCULAR | Status: AC
  Filled 2022-10-07: qty 60

## 2022-10-07 MED ORDER — MIDAZOLAM HCL 2 MG/2ML IJ SOLN
INTRAMUSCULAR | Status: AC
  Filled 2022-10-07: qty 2

## 2022-10-07 MED ORDER — FENTANYL CITRATE (PF) 100 MCG/2ML IJ SOLN
INTRAMUSCULAR | Status: AC
  Filled 2022-10-07: qty 2

## 2022-10-07 MED ORDER — SODIUM CHLORIDE 0.9 % IV SOLN: 80.0000 mg | INTRAVENOUS | Status: AC

## 2022-10-07 MED ORDER — CHLORHEXIDINE GLUCONATE 4 % EX SOLN: 4.0000 | Freq: Once | CUTANEOUS | Status: DC

## 2022-10-07 MED ORDER — FENTANYL CITRATE (PF) 100 MCG/2ML IJ SOLN
INTRAMUSCULAR | Status: DC | PRN
  Administered 2022-10-07: 25 ug via INTRAVENOUS

## 2022-10-07 MED ORDER — VANCOMYCIN HCL IN DEXTROSE 1-5 GM/200ML-% IV SOLN
INTRAVENOUS | Status: AC
  Administered 2022-10-07: 1000 mg via INTRAVENOUS
  Filled 2022-10-07: qty 200

## 2022-10-07 MED ORDER — POVIDONE-IODINE 10 % EX SWAB: 2.0000 | Freq: Once | CUTANEOUS | Status: DC

## 2022-10-07 MED ORDER — SODIUM CHLORIDE 0.9 % IV SOLN
INTRAVENOUS | Status: AC
  Administered 2022-10-07: 80 mg
  Filled 2022-10-07: qty 2

## 2022-10-07 MED ORDER — MIDAZOLAM HCL 5 MG/5ML IJ SOLN
INTRAMUSCULAR | Status: DC | PRN
  Administered 2022-10-07: 1 mg via INTRAVENOUS

## 2022-10-07 MED ORDER — ACETAMINOPHEN 325 MG PO TABS: 325.0000 mg | ORAL_TABLET | ORAL | Status: DC | PRN

## 2022-10-07 MED ORDER — ONDANSETRON HCL 4 MG/2ML IJ SOLN: 4.0000 mg | Freq: Four times a day (QID) | INTRAMUSCULAR | Status: DC | PRN

## 2022-10-07 MED ORDER — VANCOMYCIN HCL IN DEXTROSE 1-5 GM/200ML-% IV SOLN: 1000.0000 mg | INTRAVENOUS | Status: AC

## 2022-10-07 SURGICAL SUPPLY — 8 items
CABLE SURGICAL S-101-97-12 (CABLE) ×1 IMPLANT
IPG PACE AZUR XT DR MRI W1DR01 (Pacemaker) IMPLANT
KIT WRENCH (KITS) IMPLANT
PACE AZURE XT DR MRI W1DR01 (Pacemaker) ×1 IMPLANT
PAD DEFIB RADIO PHYSIO CONN (PAD) ×1 IMPLANT
POUCH AIGIS-R ANTIBACT PPM (Mesh General) ×1 IMPLANT
POUCH AIGIS-R ANTIBACT PPM MED (Mesh General) IMPLANT
TRAY PACEMAKER INSERTION (PACKS) ×1 IMPLANT

## 2022-10-07 NOTE — Discharge Instructions (Signed)
Implantable Cardiac Device Battery Change, Care After  This sheet gives you information about how to care for yourself after your procedure. Your health care provider may also give you more specific instructions. If you have problems or questions, contact your health care provider. What can I expect after the procedure? After your procedure, it is common to have: Pain or soreness at the site where the cardiac device was inserted. Swelling at the site where the cardiac device was inserted. You should received an information card for your new device in 4-8 weeks. Follow these instructions at home: Incision care  Keep the incision clean and dry. Do not take baths, swim, or use a hot tub until after your wound check.  Do not shower for at least 7 days, or as directed by your health care provider. Pat the area dry with a clean towel. Do not rub the area. This may cause bleeding. Follow instructions from your health care provider about how to take care of your incision. Make sure you: Leave stitches (sutures), skin glue, or adhesive strips in place. These skin closures may need to stay in place for 2 weeks or longer. If adhesive strip edges start to loosen and curl up, you may trim the loose edges. Do not remove adhesive strips completely unless your health care provider tells you to do that. Check your incision area every day for signs of infection. Check for: More redness, swelling, or pain. More fluid or blood. Warmth. Pus or a bad smell. Activity Do not lift anything that is heavier than 10 lb (4.5 kg) until your health care provider says it is okay to do so. For the first week, or as [place] as told by your health care provider: Avoid lifting your affected arm higher than your shoulder. After 1 week, Be gentle when you move your arms over your head. It is okay to raise your arm to comb your hair. Avoid strenuous exercise. Ask your health care provider when it is okay to: Resume your normal  activities. Return to work or school. Resume sexual activity. Eating and drinking Eat a heart-healthy diet. This should include plenty of fresh fruits and vegetables, whole grains, low-fat dairy products, and lean protein like chicken and fish. Limit alcohol intake to no more than 1 drink a day for non-pregnant women and 2 drinks a day for men. One drink equals 12 oz of beer, 5 oz of wine, or 1 oz of hard liquor. Check ingredients and nutrition facts on packaged foods and beverages. Avoid the following types of food: Food that is high in salt (sodium). Food that is high in saturated fat, like full-fat dairy or red meat. Food that is high in trans fat, like fried food. Food and drinks that are high in sugar. Lifestyle Do not use any products that contain nicotine or tobacco, such as cigarettes and e-cigarettes. If you need help quitting, ask your health care provider. Take steps to manage and control your weight. Once cleared, get regular exercise. Aim for 150 minutes of moderate-intensity exercise (such as walking or yoga) or 75 minutes of vigorous exercise (such as running or swimming) each week. Manage other health problems, such as diabetes or high blood pressure. Ask your health care provider how you can manage these conditions. General instructions Do not drive for 24 hours after your procedure if you were given a medicine to help you relax (sedative). Take over-the-counter and prescription medicines only as told by your health care provider. Avoid putting pressure   on the area where the cardiac device was placed. If you need an MRI after your cardiac device has been placed, be sure to tell the health care provider who orders the MRI that you have a cardiac device. Avoid close and prolonged exposure to electrical devices that have strong magnetic fields. These include: Cell phones. Avoid keeping them in a pocket near the cardiac device, and try using the ear opposite the cardiac  device. MP3 players. Household appliances, like microwaves. Metal detectors. Electric generators. High-tension wires. Keep all follow-up visits as directed by your health care provider. This is important. Contact a health care provider if: You have pain at the incision site that is not relieved by over-the-counter or prescription medicines. You have any of these around your incision site or coming from it: More redness, swelling, or pain. Fluid or blood. Warmth to the touch. Pus or a bad smell. You have a fever. You feel brief, occasional palpitations, light-headedness, or any symptoms that you think might be related to your heart. Get help right away if: You experience chest pain that is different from the pain at the cardiac device site. You develop a red streak that extends above or below the incision site. You experience shortness of breath. You have palpitations or an irregular heartbeat. You have light-headedness that does not go away quickly. You faint or have dizzy spells. Your pulse suddenly drops or increases rapidly and does not return to normal. You begin to gain weight and your legs and ankles swell. Summary After your procedure, it is common to have pain, soreness, and some swelling where the cardiac device was inserted. Make sure to keep your incision clean and dry. Follow instructions from your health care provider about how to take care of your incision. Check your incision every day for signs of infection, such as more pain or swelling, pus or a bad smell, warmth, or leaking fluid and blood. Avoid strenuous exercise and lifting your left arm higher than your shoulder for 2 weeks, or as [place] as told by your health care provider. This information is not intended to replace advice given to you by your health care provider. Make sure you discuss any questions you have with your health care provider. 

## 2022-10-07 NOTE — H&P (Signed)
PCP: Toma Deiters, MD Primary Cardiologist: Dr Diona Browner Primary EP:  Dr Gloriajean Dell is a 82 y.o. female who presents today for routine electrophysiology followup.  Since last being seen in our clinic, the patient reports doing reasonably well.     Today, she denies symptoms of palpitations, chest pain,  dizziness, presyncope, or syncope.  The patient is otherwise without complaint today.   There have been no significant changes in her condition since our clinic visit in January. She had a visit to [facility] for fluid retention and CHF symptoms. She reports that she is doing very well today and has no complaints.  Past Medical History:  Diagnosis Date   Arthritis    Cellulitis    Right leg   Hyperlipidemia    Hypertension    Lobular carcinoma of right breast (HCC) 09/27/2014   Malignant neoplasm of upper-outer quadrant of right female breast (HCC) 09/27/2014   Paroxysmal atrial fibrillation (HCC)    Presence of permanent cardiac pacemaker    Pulmonary hypertension (HCC)    Thoracic ascending aortic aneurysm (HCC)    Varicose veins    Past Surgical History:  Procedure Laterality Date   ABDOMINAL HYSTERECTOMY     CHOLECYSTECTOMY     COLONOSCOPY WITH PROPOFOL N/A 08/05/2022   Procedure: COLONOSCOPY WITH PROPOFOL;  Surgeon: Dolores Frame, MD;  [facility];  Service: Gastroenterology;  Laterality: N/A;  10:30am, asa 3   ESOPHAGOGASTRODUODENOSCOPY N/A 01/13/2018   Procedure: ESOPHAGOGASTRODUODENOSCOPY (EGD);  Surgeon: Malissa Hippo, MD;  [facility];  Service: Endoscopy;  Laterality: N/A;   ESOPHAGOGASTRODUODENOSCOPY (EGD) WITH PROPOFOL N/A 08/05/2022   Procedure: ESOPHAGOGASTRODUODENOSCOPY (EGD) WITH PROPOFOL;  Surgeon: Dolores Frame, MD;  [facility];  Service: Gastroenterology;  Laterality: N/A;   FOOT SURGERY     Multiple foot surgeries by Dr. Marlaine Hind CAPSULE STUDY N/A 08/26/2017   Procedure: GIVENS  CAPSULE STUDY;  Surgeon: Malissa Hippo, MD;  [facility];  Service: Endoscopy;  Laterality: N/A;  8:30   HOT HEMOSTASIS  08/05/2022   Procedure: HOT HEMOSTASIS (ARGON PLASMA COAGULATION/BICAP);  Surgeon: Marguerita Merles, Reuel Boom, MD;  [facility];  Service: Gastroenterology;;   JOINT REPLACEMENT     bilateral knee by Drs. McGee and McGinley   PACEMAKER IMPLANT Left 05/12/2013   MDT Jana Half PPM implanted by Dr Weber Cooks for CHB and SSS   POLYPECTOMY  08/05/2022   Procedure: POLYPECTOMY INTESTINAL;  Surgeon: Marguerita Merles, Reuel Boom, MD;  [facility];  Service: Gastroenterology;;   SHOULDER SURGERY      ROS- all systems are reviewed and negative except as per HPI above  Current Facility-Administered Medications  Medication Dose Route Frequency Provider Last Rate Last Admin   0.9 %  sodium chloride infusion   Intravenous Continuous Arma Reining, Roberts Gaudy, MD 50 mL/hr at 10/07/22 1107 New Bag at 10/07/22 1107   chlorhexidine (HIBICLENS) 4 % liquid 4 Application  4 Application Topical Once Tova Vater, Roberts Gaudy, MD       gentamicin (GARAMYCIN) 80 mg in sodium chloride 0.9 % 500 mL irrigation  80 mg Irrigation On Call Micharl Helmes, Roberts Gaudy, MD       povidone-iodine 10 % swab 2 Application  2 Application Topical Once Burnette Sautter, Roberts Gaudy, MD       vancomycin (VANCOCIN) IVPB 1000 mg/200 mL premix  1,000 mg Intravenous On Call Saige Canton, Roberts Gaudy, MD       Facility-Administered  Medications Ordered in Other Encounters  Medication Dose Route Frequency Provider Last Rate Last Admin   0.9 %  sodium chloride infusion   Intravenous Continuous Doreatha Massed, MD 20 mL/hr at 05/07/18 1447 New Bag at 05/07/18 1447    Physical Exam: Vitals:   10/07/22 1053  BP: (!) 159/66  Pulse: 65  Resp: 18  Temp: 98 F (36.7 C)  TempSrc: Oral  SpO2: 98%  Weight: 80.3 kg  Height: 5\' 3"  (1.6 m)     Gen: Appears comfortable, well-nourished CV: RRR, no dependent edema The device site is  normal -- no tenderness, edema, drainage, redness, threatened erosion. Pulm: breathing easily  Pacemaker interrogation- reviewed in detail today,  See PACEART report  ekg tracing ordered today is personally reviewed and shows AV paced  Echo 01/01/21 shows EF 60%, pulmonary htn, moderate to severe LA enlargement, grade II diastolic dysfunction  Echo 10/2021 shows EF 60-65%, normal RVSP  Assessment and Plan:  1. Symptomatic sinus bradycardia and complete heart block Normal pacemaker function See Pace Art report No changes today she is device dependant today  2. Paroxymal atrial fibrillation  Burden 2% (previously <1%) Continue eliquis  3. HTN Stable No change required today  4. CHFpEF Echo 6/23 reviewed - EF normal, RVSP normal range now - pt missed follow-up with Dr. Diona Browner; will have her reschedule  5. Medtronic dual chamber pacemaker --at RRT. We will proceed with generator change today. Risks including infection, bleeding, damage to the device, and a very low but not zero risk of death were explained to the patient.  Return in a year  Maurice Small, MD 10/07/2022 2:58 PM

## 2022-10-08 ENCOUNTER — Encounter (HOSPITAL_COMMUNITY): Payer: Self-pay | Admitting: Cardiovascular Disease

## 2022-10-08 MED FILL — Midazolam HCl Inj 2 MG/2ML (Base Equivalent): INTRAMUSCULAR | Qty: 1 | Status: AC

## 2022-10-13 NOTE — Progress Notes (Signed)
Remote pacemaker transmission.   

## 2022-10-16 ENCOUNTER — Ambulatory Visit (INDEPENDENT_AMBULATORY_CARE_PROVIDER_SITE_OTHER): Payer: Medicare Other | Admitting: Gastroenterology

## 2022-10-16 ENCOUNTER — Ambulatory Visit: Payer: Medicare Other | Attending: Cardiology

## 2022-10-16 DIAGNOSIS — I442 Atrioventricular block, complete: Secondary | ICD-10-CM

## 2022-10-16 LAB — CUP PACEART INCLINIC DEVICE CHECK
Date Time Interrogation Session: 20240613130252
Implantable Lead Connection Status: 753985
Implantable Lead Connection Status: 753985
Implantable Lead Implant Date: 20150108
Implantable Lead Implant Date: 20150108
Implantable Lead Location: 753859
Implantable Lead Location: 753860
Implantable Lead Model: 5076
Implantable Lead Model: 5076
Implantable Pulse Generator Implant Date: 20240604

## 2022-10-16 NOTE — Patient Instructions (Signed)

## 2022-10-16 NOTE — Progress Notes (Signed)
Wound check appointment. Steri-strips removed. Wound without redness or edema. Incision edges approximated, wound well healed. Normal device function. RA threshold improved to 1V @ 0.71ms. Sensing, and impedances consistent with implant measurements. RA threshold improved from Histogram distribution appropriate for patient and level of activity. No mode switches or high ventricular rates noted. Patient educated about wound care.Marland Kitchen ROV in 3 months with implanting physician.

## 2022-11-05 ENCOUNTER — Ambulatory Visit: Payer: Medicare Other

## 2022-11-07 ENCOUNTER — Encounter: Payer: Medicare Other | Admitting: Cardiovascular Disease

## 2022-11-11 DIAGNOSIS — M13842 Other specified arthritis, left hand: Secondary | ICD-10-CM | POA: Diagnosis not present

## 2022-11-11 DIAGNOSIS — M21949 Unspecified acquired deformity of hand, unspecified hand: Secondary | ICD-10-CM | POA: Diagnosis not present

## 2022-11-11 DIAGNOSIS — M13841 Other specified arthritis, right hand: Secondary | ICD-10-CM | POA: Diagnosis not present

## 2022-11-11 DIAGNOSIS — I10 Essential (primary) hypertension: Secondary | ICD-10-CM | POA: Diagnosis not present

## 2022-11-25 ENCOUNTER — Ambulatory Visit: Payer: Medicare Other | Admitting: Cardiology

## 2022-12-01 DIAGNOSIS — I10 Essential (primary) hypertension: Secondary | ICD-10-CM | POA: Diagnosis not present

## 2022-12-01 DIAGNOSIS — M1009 Idiopathic gout, multiple sites: Secondary | ICD-10-CM | POA: Diagnosis not present

## 2022-12-01 DIAGNOSIS — I5022 Chronic systolic (congestive) heart failure: Secondary | ICD-10-CM | POA: Diagnosis not present

## 2022-12-16 ENCOUNTER — Ambulatory Visit (INDEPENDENT_AMBULATORY_CARE_PROVIDER_SITE_OTHER): Payer: Medicare Other | Admitting: Gastroenterology

## 2022-12-16 ENCOUNTER — Encounter (INDEPENDENT_AMBULATORY_CARE_PROVIDER_SITE_OTHER): Payer: Self-pay | Admitting: Gastroenterology

## 2022-12-16 VITALS — BP 122/64 | HR 60 | Temp 98.1°F | Ht 63.0 in | Wt 174.9 lb

## 2022-12-16 DIAGNOSIS — R932 Abnormal findings on diagnostic imaging of liver and biliary tract: Secondary | ICD-10-CM | POA: Diagnosis not present

## 2022-12-16 DIAGNOSIS — R634 Abnormal weight loss: Secondary | ICD-10-CM | POA: Diagnosis not present

## 2022-12-16 DIAGNOSIS — D508 Other iron deficiency anemias: Secondary | ICD-10-CM

## 2022-12-16 LAB — HEMOGLOBIN AND HEMATOCRIT, BLOOD
HCT: 32.8 % — ABNORMAL LOW (ref 35.0–45.0)
Hemoglobin: 10.4 g/dL — ABNORMAL LOW (ref 11.7–15.5)

## 2022-12-16 NOTE — Patient Instructions (Signed)
We will check your blood counts and iron stores to ensure no worsening anemia, given you are no longer on your iron pills We will keep an eye on your weight. Try to make sure you are getting plenty of protein, you can add a boost or ensure protein shake once daily to help with added nutrition as well  Follow up 6 months

## 2022-12-16 NOTE — Progress Notes (Signed)
Referring Provider: Toma Deiters, MD Primary Care Physician:  Toma Deiters, MD Primary GI Physician:   Chief Complaint  Patient presents with   Anemia    Follow up on IDA. Not taking any iron. Stays tired and weak. Not seeing any blood in stools or dark stools.    HPI:   Alicia Nash is a 82 y.o. female with past medical history of arthritis, HLD, HTN, Breast cancer, Paroxysmal A fib, Pulmonary HTN, thoracic ascending aortic aneurysm   Patient presenting today for follow up of IDA   Last seen march 2024 for blood in stools on FOBT. Labs 05/2022 with hgb 10.3, TIBC 323, iron 44, Iron sat 14, ferritin 233 (iron studies normal in September)   Recommended to continue PPI BID, schedule EGD/colonoscopy  EGD and colonoscopy as outlined below, recommended to consider capsule endoscopy if anemia worsens  Last labs with hgb 11.5 in may 2024.   Present:  Patient reports that she is not on iron pills anymore, unsure of when she stopped or why. Denies rectal bleeding or melena. She has some SOB at times, usually when she is up exerting herself. She reports BP goes up and down and she typically has a headache and becomes dizzy when BP drops. Denies abdominal pain, nausea, vomiting. She is not eating as much as she was, she reports having some issues with gout recently and has had to cut out certain foods she liked to avoid recurrence of this. She has lost about 14 pounds since January.   Last Colonoscopy:08/2022- One 1 mm polyp in the cecum, removed with a cold                            biopsy forceps. Resected and retrieved.                           - Two 3 to 5 mm polyps in the descending colon,                            removed with a cold snare. Resected and retrieved.                           - Diverticulosis in the entire examined colon.                           - The distal rectum and anal verge are normal on                            retroflexion view. (Path showed two TAs  and one lymphoid aggregate), no need to repeat   Last Endoscopy:08/2022- Normal esophagus.                           - Gastric antral vascular ectasia without bleeding.                            Treated with argon plasma coagulation (APC).                           - Normal examined duodenum.                           -  No specimens collected.  Past Medical History:  Diagnosis Date   Arthritis    Cellulitis    Right leg   Hyperlipidemia    Hypertension    Lobular carcinoma of right breast (HCC) 09/27/2014   Malignant neoplasm of upper-outer quadrant of right female breast (HCC) 09/27/2014   Paroxysmal atrial fibrillation (HCC)    Presence of permanent cardiac pacemaker    Pulmonary hypertension (HCC)    Thoracic ascending aortic aneurysm (HCC)    Varicose veins     Past Surgical History:  Procedure Laterality Date   ABDOMINAL HYSTERECTOMY     CHOLECYSTECTOMY     COLONOSCOPY WITH PROPOFOL N/A 08/05/2022   Procedure: COLONOSCOPY WITH PROPOFOL;  Surgeon: Dolores Frame, MD;  [facility];  Service: Gastroenterology;  Laterality: N/A;  10:30am, asa 3   ESOPHAGOGASTRODUODENOSCOPY N/A 01/13/2018   Procedure: ESOPHAGOGASTRODUODENOSCOPY (EGD);  Surgeon: Malissa Hippo, MD;  [facility];  Service: Endoscopy;  Laterality: N/A;   ESOPHAGOGASTRODUODENOSCOPY (EGD) WITH PROPOFOL N/A 08/05/2022   Procedure: ESOPHAGOGASTRODUODENOSCOPY (EGD) WITH PROPOFOL;  Surgeon: Dolores Frame, MD;  [facility];  Service: Gastroenterology;  Laterality: N/A;   FOOT SURGERY     Multiple foot surgeries by Dr. Marlaine Hind CAPSULE STUDY N/A 08/26/2017   Procedure: GIVENS CAPSULE STUDY;  Surgeon: Malissa Hippo, MD;  [facility];  Service: Endoscopy;  Laterality: N/A;  8:30   HOT HEMOSTASIS  08/05/2022   Procedure: HOT HEMOSTASIS (ARGON PLASMA COAGULATION/BICAP);  Surgeon: Marguerita Merles, Reuel Boom, MD;  [facility]: [facility];  Service:  Gastroenterology;;   JOINT REPLACEMENT     bilateral knee by Drs. McGee and McGinley   PACEMAKER IMPLANT Left 05/12/2013   MDT Jana Half PPM implanted by Dr Weber Cooks for CHB and SSS   POLYPECTOMY  08/05/2022   Procedure: POLYPECTOMY INTESTINAL;  Surgeon: Dolores Frame, MD;  [facility];  Service: Gastroenterology;;   [facility] GENERATOR CHANGEOUT N/A 10/07/2022   Procedure: PPM GENERATOR CHANGEOUT;  Surgeon: Maurice Small, MD;  [facility]: [facility];  Service: Cardiovascular;  Laterality: N/A;   SHOULDER SURGERY      Current Outpatient Medications  Medication Sig Dispense Refill   acetaminophen (TYLENOL) 325 MG tablet Take 650 mg by mouth every 6 (six) hours as needed for moderate pain.     acetaZOLAMIDE (DIAMOX) 250 MG tablet Take 250 mg by mouth daily.     albuterol (VENTOLIN HFA) 108 (90 Base) MCG/ACT inhaler Inhale 1-2 puffs into the lungs every 6 (six) hours as needed for shortness of breath or wheezing.     anastrozole (ARIMIDEX) 1 MG tablet TAKE ONE TABLET BY MOUTH DAILY 30 tablet 5   apixaban (ELIQUIS) 2.5 MG TABS tablet Take 1 tablet (2.5 mg total) by mouth 2 (two) times daily. 60 tablet 1   carvedilol (COREG) 12.5 MG tablet Take 12.5 mg by mouth 2 (two) times daily with a meal.     citalopram (CELEXA) 20 MG tablet Take 20 mg by mouth every morning.      Colchicine 0.6 MG CAPS Take 0.6 mg by mouth daily.     gemfibrozil (LOPID) 600 MG tablet Take 600 mg by mouth 2 (two) times daily before a meal.     hydrALAZINE (APRESOLINE) 50 MG tablet Take 1 tablet (50 mg total) by mouth in the morning and at bedtime. (Patient taking differently: Take 25 mg by mouth in the morning and at bedtime.) 180 tablet 3  ipratropium-albuterol (DUONEB) 0.5-2.5 (3) MG/3ML SOLN Take 3 mLs by nebulization every 6 (six) hours as needed (wheezing).     KRILL OIL PO Take 2,000 mg by mouth daily.     olmesartan (BENICAR) 40 MG tablet TAKE ONE TABLET BY MOUTH DAILY 30 tablet 11   pantoprazole  (PROTONIX) 40 MG tablet Take 40 mg by mouth 2 (two) times daily.     rosuvastatin (CRESTOR) 10 MG tablet Take 10 mg by mouth daily.     TURMERIC PO Take 1 capsule by mouth daily.     No current facility-administered medications for this visit.   Facility-Administered Medications Ordered in Other Visits  Medication Dose Route Frequency Provider Last Rate Last Admin   0.9 %  sodium chloride infusion   Intravenous Continuous Doreatha Massed, MD 20 mL/hr at 05/07/18 1447 New Bag at 05/07/18 1447    Allergies as of 12/16/2022 - Review Complete 12/16/2022  Allergen Reaction Noted   Dihydrocodeine Other (See Comments) 05/11/2013   Allopurinol  07/08/2022   Cefuroxime  07/08/2022   Fosamax [alendronate]  07/08/2022   Hydrocodone  07/08/2022   Other  07/16/2011    Family History  Problem Relation Age of Onset   Hypertension Other    Diabetes Mother     Social History   Socioeconomic History   Marital status: Divorced    Spouse name: Not on file   Number of children: Not on file   Years of education: Not on file   Highest education level: Not on file  Occupational History   Not on file  Tobacco Use   Smoking status: Never    Passive exposure: Past   Smokeless tobacco: Never  Vaping Use   Vaping status: Never Used  Substance and Sexual Activity   Alcohol use: No   Drug use: No   Sexual activity: Not on file  Other Topics Concern   Not on file  Social History Narrative   Lives in [place]   Divorced   Retired from [employer] services   Social Determinants of Health   Financial Resource Strain: Low Risk  (09/26/2022)   Received from [facility], [facility]   Overall Financial Resource Strain (CARDIA)    Difficulty of Paying Living Expenses: Not hard at all  Food Insecurity: No Food Insecurity (09/26/2022)   Received from [facility], [facility]   Hunger Vital Sign    Worried About Running Out of Food in the Last Year: Never true     Ran Out of Food in the Last Year: Never true  Transportation Needs: No Transportation Needs (09/26/2022)   Received from [facility], [facility]   [place] - Transportation    Lack of Transportation (Medical): No    Lack of Transportation (Non-Medical): No  Physical Activity: Insufficiently Active (03/08/2020)   Exercise Vital Sign    Days of Exercise per Week: 2 days    Minutes of Exercise per Session: 30 min  Stress: No Stress Concern Present (03/08/2020)   [employer] of Occupational Health - Occupational Stress Questionnaire    Feeling of Stress : Not at all  Social Connections: Moderately Isolated (03/08/2020)   Social Connection and Isolation Panel [NHANES]    Frequency of Communication with Friends and Family: More than three times a week    Frequency of Social Gatherings with Friends and Family: More than three times a week    Attends Religious Services: More than 4 times per year  Active Member of Clubs or Organizations: No    Attends Banker Meetings: Never    Marital Status: Widowed    Review of systems General: negative for malaise, night sweats, fever, chills, weight loss Neck: Negative for lumps, goiter, pain and significant neck swelling Resp: Negative for cough, wheezing, dyspnea at rest CV: Negative for chest pain, leg swelling, palpitations, orthopnea GI: denies melena, hematochezia, nausea, vomiting, diarrhea, constipation, dysphagia, odyonophagia, early satiety +weight loss  MSK: Negative for joint pain or swelling, back pain, and muscle pain. Derm: Negative for itching or rash Psych: Denies depression, anxiety, memory loss, confusion. No homicidal or suicidal ideation.  Heme: Negative for prolonged bleeding, bruising easily, and swollen nodes. Endocrine: Negative for cold or heat intolerance, polyuria, polydipsia and goiter. Neuro: negative for tremor, gait imbalance, syncope and seizures. The remainder of the review of systems is  noncontributory.  Physical Exam: BP 122/64 (BP Location: Left Arm, Patient Position: Sitting, Cuff Size: Normal)   Pulse 60   Temp 98.1 F (36.7 C) (Oral)   Ht 5\' 3"  (1.6 m)   Wt 174 lb 14.4 oz (79.3 kg)   BMI 30.98 kg/m  General:   Alert and oriented. No distress noted. Pleasant and cooperative.  Head:  Normocephalic and atraumatic. Eyes:  Conjuctiva clear without scleral icterus. Mouth:  Oral mucosa pink and moist. Good dentition. No lesions. Heart: Normal rate and rhythm, s1 and s2 heart sounds present.  Lungs: Clear lung sounds in all lobes. Respirations equal and unlabored. Abdomen:  +BS, soft, non-tender and non-distended. No rebound or guarding. No HSM or masses noted. Derm: No palmar erythema or jaundice Msk:  Symmetrical without gross deformities. Normal posture. Extremities:  Without edema. Neurologic:  Alert and  oriented x4 Psych:  Alert and cooperative. Normal mood and affect.  Invalid input(s): "6 MONTHS"   ASSESSMENT: Alicia Nash is a 82 y.o. female presenting today for follow up of IDA, also with some weight loss  IDA: previously with positive occult stool and mildly low iron sat in January 2024, she underwent EGD and Colonoscopy which showed GAVE and a few polyps, respectively. She is no longer on iron pills, unsure why she stopped or when. She denies rectal bleeding/melena. Has some SOB on exertion and some dizziness though attributes dizziness to changes in her BP. Dizziness and SOB could also be secondary to history of paroxysmal A fib for which she follows with cardiology for. Will update h&h and Iron studies today. Will need to consider Capsule endoscopy if labs indicate ongoing IDA.   Weight loss: down 14 pounds since January, etiology of this is unclear. Recent EGD and Colonoscopy as outlined above. CT chest in February with pulmonary nodule and dilated aorta. She has cut out some foods due to gout, possibly secondary to this. She denies early satiety,  nausea, or vomiting. Will need to keep a close eye on her weight trend. I encouraged her to try and liberalize her diet as best she can while avoiding gout causing foods as she was instructed by her PCP, she can add boost/ensure protein shake daily to help maintain nutrition.   Abnormal CT of liver: CT chest in February also with incidental findings of changes of liver indicating potential cirrhosis. NAFLD score is 1.56 indicating possible F3-f4 fibrosis. Will need to perform US liver elastography for further evaluation of underlying liver fibrosis/cirrhosis.    PLAN:  H&h, Iron studies  2. Consider capsule endoscopy if anemia persisting  3. US liver elastography  4. Boost/ensure protein shake daily 5. Liberalize diet/avoiding gout potentiating foods  All questions were answered, patient verbalized understanding and is in agreement with plan as outlined above.   Follow Up: 6 months    L. , MSN, APRN, AGNP-C Adult-Gerontology Nurse Practitioner [facility]  I have reviewed the note and agree with the APP's assessment as described in this progress note  If persisting mild anemia but no severe drop in her hemoglobin, we will pursue first repeat EGD to make sure there is no receivable GAVE.  If there is no GAVE then we will proceed with capsule endoscopy.  Katrinka Blazing, MD Gastroenterology and Hepatology [facility]

## 2022-12-18 ENCOUNTER — Telehealth (INDEPENDENT_AMBULATORY_CARE_PROVIDER_SITE_OTHER): Payer: Self-pay | Admitting: Gastroenterology

## 2022-12-18 NOTE — Telephone Encounter (Signed)
Error-see result message

## 2022-12-22 ENCOUNTER — Other Ambulatory Visit (INDEPENDENT_AMBULATORY_CARE_PROVIDER_SITE_OTHER): Payer: Self-pay | Admitting: Gastroenterology

## 2022-12-22 DIAGNOSIS — K219 Gastro-esophageal reflux disease without esophagitis: Secondary | ICD-10-CM | POA: Diagnosis not present

## 2022-12-22 DIAGNOSIS — J301 Allergic rhinitis due to pollen: Secondary | ICD-10-CM | POA: Diagnosis not present

## 2022-12-22 DIAGNOSIS — I10 Essential (primary) hypertension: Secondary | ICD-10-CM | POA: Diagnosis not present

## 2022-12-22 MED ORDER — FERROUS SULFATE 325 (65 FE) MG PO TABS: 325.0000 mg | ORAL_TABLET | Freq: Every day | ORAL | 1 refills | Status: DC

## 2022-12-26 ENCOUNTER — Encounter (INDEPENDENT_AMBULATORY_CARE_PROVIDER_SITE_OTHER): Payer: Self-pay

## 2022-12-29 DIAGNOSIS — M25774 Osteophyte, right foot: Secondary | ICD-10-CM | POA: Diagnosis not present

## 2022-12-29 DIAGNOSIS — I10 Essential (primary) hypertension: Secondary | ICD-10-CM | POA: Diagnosis not present

## 2022-12-31 ENCOUNTER — Ambulatory Visit (HOSPITAL_COMMUNITY): Payer: Medicare Other

## 2023-01-01 NOTE — Progress Notes (Signed)
Ultrasound has been scheduled for 01/09/23 and pt is aware

## 2023-01-07 ENCOUNTER — Ambulatory Visit (INDEPENDENT_AMBULATORY_CARE_PROVIDER_SITE_OTHER): Payer: Medicare Other

## 2023-01-07 DIAGNOSIS — I442 Atrioventricular block, complete: Secondary | ICD-10-CM

## 2023-01-07 LAB — CUP PACEART REMOTE DEVICE CHECK
Battery Remaining Longevity: 120 mo
Battery Voltage: 3.15 V
Brady Statistic AP VP Percent: 45.53 %
Brady Statistic AP VS Percent: 0 %
Brady Statistic AS VP Percent: 54.37 %
Brady Statistic AS VS Percent: 0.07 %
Brady Statistic RA Percent Paced: 42.12 %
Brady Statistic RV Percent Paced: 99.93 %
Date Time Interrogation Session: 20240903221805
Implantable Lead Connection Status: 753985
Implantable Lead Connection Status: 753985
Implantable Lead Implant Date: 20150108
Implantable Lead Implant Date: 20150108
Implantable Lead Location: 753859
Implantable Lead Location: 753860
Implantable Lead Model: 5076
Implantable Lead Model: 5076
Implantable Pulse Generator Implant Date: 20240604
Lead Channel Impedance Value: 266 Ohm
Lead Channel Impedance Value: 285 Ohm
Lead Channel Impedance Value: 304 Ohm
Lead Channel Impedance Value: 399 Ohm
Lead Channel Pacing Threshold Amplitude: 0.75 V
Lead Channel Pacing Threshold Amplitude: 1.5 V
Lead Channel Pacing Threshold Pulse Width: 0.4 ms
Lead Channel Pacing Threshold Pulse Width: 0.4 ms
Lead Channel Sensing Intrinsic Amplitude: 1.125 mV
Lead Channel Sensing Intrinsic Amplitude: 1.125 mV
Lead Channel Setting Pacing Amplitude: 2 V
Lead Channel Setting Pacing Amplitude: 3 V
Lead Channel Setting Pacing Pulse Width: 0.4 ms
Lead Channel Setting Sensing Sensitivity: 1.2 mV
Zone Setting Status: 755011
Zone Setting Status: 755011

## 2023-01-09 ENCOUNTER — Ambulatory Visit (HOSPITAL_COMMUNITY)
Admission: RE | Admit: 2023-01-09 | Discharge: 2023-01-09 | Disposition: A | Discharge status: Home / Self Care | Payer: Medicare Other | Source: Ambulatory Visit | Attending: Gastroenterology | Admitting: Gastroenterology

## 2023-01-09 DIAGNOSIS — K759 Inflammatory liver disease, unspecified: Secondary | ICD-10-CM | POA: Diagnosis not present

## 2023-01-09 DIAGNOSIS — K769 Liver disease, unspecified: Secondary | ICD-10-CM | POA: Diagnosis not present

## 2023-01-09 DIAGNOSIS — R932 Abnormal findings on diagnostic imaging of liver and biliary tract: Secondary | ICD-10-CM | POA: Insufficient documentation

## 2023-01-15 ENCOUNTER — Telehealth (INDEPENDENT_AMBULATORY_CARE_PROVIDER_SITE_OTHER): Payer: Self-pay | Admitting: *Deleted

## 2023-01-15 NOTE — Telephone Encounter (Signed)
Patient called asking for [place] results ordered by chelsea.   [phone]

## 2023-01-16 ENCOUNTER — Encounter: Payer: Medicare Other | Admitting: Cardiovascular Disease

## 2023-01-16 NOTE — Telephone Encounter (Signed)
Spoke with him regarding results of elastography which were within normal limits.  Patient was reassured about findings.

## 2023-01-20 NOTE — Progress Notes (Signed)
Remote pacemaker transmission.   

## 2023-01-21 DIAGNOSIS — I10 Essential (primary) hypertension: Secondary | ICD-10-CM | POA: Diagnosis not present

## 2023-01-21 DIAGNOSIS — J40 Bronchitis, not specified as acute or chronic: Secondary | ICD-10-CM | POA: Diagnosis not present

## 2023-01-26 DIAGNOSIS — M25572 Pain in left ankle and joints of left foot: Secondary | ICD-10-CM | POA: Diagnosis not present

## 2023-02-06 ENCOUNTER — Encounter: Payer: Self-pay | Admitting: Cardiovascular Disease

## 2023-02-06 ENCOUNTER — Ambulatory Visit: Payer: Medicare Other | Attending: Cardiovascular Disease | Admitting: Cardiovascular Disease

## 2023-02-06 VITALS — BP 132/70 | HR 66 | Ht 63.0 in | Wt 172.0 lb

## 2023-02-06 DIAGNOSIS — I442 Atrioventricular block, complete: Secondary | ICD-10-CM | POA: Diagnosis not present

## 2023-02-06 NOTE — Patient Instructions (Signed)
Medication Instructions:  Continue all current medications.  Labwork: none  Testing/Procedures: none  Follow-Up: 1 year   Any Other Special Instructions Will Be Listed Below (If Applicable).  If you need a refill on your cardiac medications before your next appointment, please call your pharmacy.  

## 2023-02-06 NOTE — Progress Notes (Signed)
    PCP: Toma Deiters, MD Primary Cardiologist: Dr Diona Browner Primary EP:  Dr Gloriajean Dell is a 82 y.o. female who presents today for routine electrophysiology followup.  Since last being seen in our clinic, the patient reports doing reasonably well.    She underwent an uneventful generator change on October 07, 2022  Today, she denies symptoms of palpitations, chest pain,  dizziness, presyncope, or syncope.  The patient is otherwise without complaint today.     Physical Exam: Vitals:   02/06/23 0935  Height: 5\' 3"  (1.6 m)     Gen: Appears comfortable, well-nourished CV: RRR, no dependent edema The device site is normal -- no tenderness, edema, drainage, redness, threatened erosion. Pulm: breathing easily  Pacemaker interrogation- reviewed in detail today,  See PACEART report      Echo 01/01/21 shows EF 60%, pulmonary htn, moderate to severe LA enlargement, grade II diastolic dysfunction  Echo 10/2021 shows EF 60-65%, normal RVSP  Assessment and Plan:  1. Symptomatic sinus bradycardia and complete heart block Normal pacemaker function See Pace Art report No changes today she is device dependant today  2. Paroxymal atrial fibrillation  Burden about 5% (previously 2%) Continue eliquis  3. HTN Stable No change required today  4. CHFpEF Echo 6/23 reviewed - EF normal, RVSP normal range now Appears compensated and euvolemic today  5. Medtronic dual chamber pacemaker -- normal function.  Return in a year  Maurice Small, MD 02/06/2023 9:37 AM

## 2023-02-09 LAB — CUP PACEART INCLINIC DEVICE CHECK
Date Time Interrogation Session: 20241004084958
Implantable Lead Connection Status: 753985
Implantable Lead Connection Status: 753985
Implantable Lead Implant Date: 20150108
Implantable Lead Implant Date: 20150108
Implantable Lead Location: 753859
Implantable Lead Location: 753860
Implantable Lead Model: 5076
Implantable Lead Model: 5076
Implantable Pulse Generator Implant Date: 20240604

## 2023-02-11 ENCOUNTER — Ambulatory Visit: Payer: Medicare Other | Admitting: Cardiology

## 2023-02-17 ENCOUNTER — Ambulatory Visit: Payer: Medicare Other | Attending: Cardiology | Admitting: Cardiology

## 2023-02-17 ENCOUNTER — Encounter: Payer: Self-pay | Admitting: Cardiology

## 2023-02-17 VITALS — BP 132/58 | HR 60 | Ht 63.0 in | Wt 171.6 lb

## 2023-02-17 DIAGNOSIS — I77810 Thoracic aortic ectasia: Secondary | ICD-10-CM

## 2023-02-17 DIAGNOSIS — I442 Atrioventricular block, complete: Secondary | ICD-10-CM

## 2023-02-17 DIAGNOSIS — I10 Essential (primary) hypertension: Secondary | ICD-10-CM | POA: Diagnosis not present

## 2023-02-17 DIAGNOSIS — I5032 Chronic diastolic (congestive) heart failure: Secondary | ICD-10-CM | POA: Diagnosis not present

## 2023-02-17 DIAGNOSIS — I480 Paroxysmal atrial fibrillation: Secondary | ICD-10-CM | POA: Diagnosis not present

## 2023-02-17 DIAGNOSIS — M5431 Sciatica, right side: Secondary | ICD-10-CM | POA: Diagnosis not present

## 2023-02-17 NOTE — Progress Notes (Signed)
Cardiology Office Note  Date: 02/17/2023   ID: OLETHA EMSHOFF, DOB Nov 26, 1940, MRN [id number]  History of Present Illness: Alicia Nash is an 82 y.o. female last seen in January by Ms. Philis Nettle NP, I reviewed the note (I last saw her in 2022).  She is here for a routine visit.  Reports stable NYHA class II dyspnea, no fluid retention or increasing weight, no exertional chest pain, no palpitations or syncope.  She is using a cane.  Medtronic pacemaker in place with history of complete heart block, underwent generator change in June.  She follows with Dr. Nelly Laurence.  Recent device interrogation indicated normal function, AF burden 5.7%.  She had a recent visit with Dr. Nelly Laurence on October 4, I reviewed the note.  I reviewed her medications.  Current cardiac regimen includes Coreg, Eliquis, Lopid, hydralazine, Benicar, Lasix and Crestor.  I reviewed her echocardiogram results from June 2023, also interval chest CTA from February of this year.  Physical Exam: VS:  BP (!) 132/58 (BP Location: Left Arm)   Pulse 60   Ht 5\' 3"  (1.6 m)   Wt 171 lb 9.6 oz (77.8 kg)   SpO2 96%   BMI 30.40 kg/m , BMI Body mass index is 30.4 kg/m.  Wt Readings from Last 3 Encounters:  02/17/23 171 lb 9.6 oz (77.8 kg)  02/06/23 172 lb (78 kg)  12/16/22 174 lb 14.4 oz (79.3 kg)    General: Patient appears comfortable at rest. HEENT: Conjunctiva and lids normal. Neck: Supple, no elevated JVP or carotid bruits. Lungs: Clear to auscultation, nonlabored breathing at rest. Cardiac: Regular rate and rhythm, no S3, 1/6 systolic murmur, no pericardial rub. Extremities: No pitting edema.  ECG:  An ECG dated 05/23/2022 was personally reviewed today and demonstrated:  Ventricular paced rhythm.  Labwork: February 2024: Cholesterol 133, triglycerides 185, HDL 39, LDL 63 09/30/2022: BUN 42; Creatinine, Ser 1.44; Platelets 221; Potassium 3.9; Sodium 137 12/16/2022: Hemoglobin 10.4   Other Studies Reviewed  Today:  Echocardiogram 10/31/2021:  1. Left ventricular ejection fraction, by estimation, is 60 to 65%. The  left ventricle has normal function. The left ventricle has no regional  wall motion abnormalities. There is mild left ventricular hypertrophy.  Left ventricular diastolic parameters  are indeterminate.   2. Right ventricular systolic function is normal. The right ventricular  size is normal. There is normal pulmonary artery systolic pressure. The  estimated right ventricular systolic pressure is 21.0 mmHg.   3. Left atrial size was moderately dilated.   4. Right atrial size was mildly dilated.   5. The mitral valve is grossly normal. Mild mitral valve regurgitation.   6. The aortic valve is tricuspid. Aortic valve regurgitation is mild.  Aortic regurgitation PHT measures 831 msec.   7. The inferior vena cava is normal in size with greater than 50%  respiratory variability, suggesting right atrial pressure of 3 mmHg.   Assessment and Plan:  1.  HFpEF, LVEF 60 to 65% by echocardiogram in June 2023.  RV contraction normal at that time and normal RVSP estimated at 21 mmHg.  She reports no fluid retention and weight is stable.  For now plan to continue Benicar and Lasix.  Echocardiogram will be updated.  2.  Asymptomatic 4.1 cm ascending thoracic aortic dilatation, stable by chest CTA in February of this year.  Plan to repeat imaging next year.   3.  Primary hypertension.  Systolic 132 today.  No changes made in current regimen including Coreg,  hydralazine, and Benicar.   4.  Paroxysmal atrial fibrillation with CHA2DS2-VASc score of 5.  She had 5.7% rhythm burden by most recent device interrogation.  She is on Eliquis for stroke prophylaxis.  She is asymptomatic in terms of palpitations.  Reports no spontaneous bleeding problems.   5.  History of complete heart block with Medtronic pacemaker in place and follow-up by Dr. Nelly Laurence.  Underwent successful generator change in June.  6.   Small 6 mm pulmonary nodule, superior segment of the left lower lobe with additional scattered pulmonary nodules of smaller size.  Stable by CT imaging in February of this year.  This will be reimaged by CT next year as well.  Disposition:  Follow up  6 months.  Signed, Jonelle Sidle, M.D., F.A.C.C. [facility]

## 2023-02-17 NOTE — Patient Instructions (Addendum)
Medication Instructions:  Your physician recommends that you continue on your current medications as directed. Please refer to the Current Medication list given to you today.  Labwork: BMET in 6 months before CT-(Due 05/2023) Non-fasting [employer] or [employer] ([email or web address] [place])  Testing/Procedures: CT Angio Chest and Aorta in 6 months just before your next visit (05/2023) Your physician has requested that you have an echocardiogram. Echocardiography is a painless test that uses sound waves to create images of your heart. It provides your doctor with information about the size and shape of your heart and how well your heart's chambers and valves are working. This procedure takes approximately one hour. There are no restrictions for this procedure. Please do NOT wear cologne, perfume, aftershave, or lotions (deodorant is allowed). Please arrive 15 minutes prior to your appointment time.  Follow-Up: Your physician recommends that you schedule a follow-up appointment in: 6 months  Any Other Special Instructions Will Be Listed Below (If Applicable).  If you need a refill on your cardiac medications before your next appointment, please call your pharmacy.

## 2023-02-20 ENCOUNTER — Other Ambulatory Visit: Payer: Self-pay | Admitting: Hematology

## 2023-02-20 DIAGNOSIS — C50919 Malignant neoplasm of unspecified site of unspecified female breast: Secondary | ICD-10-CM

## 2023-02-26 ENCOUNTER — Other Ambulatory Visit: Payer: Self-pay | Admitting: *Deleted

## 2023-02-26 ENCOUNTER — Encounter (HOSPITAL_COMMUNITY): Payer: Self-pay | Admitting: Hematology

## 2023-02-26 NOTE — Telephone Encounter (Signed)
Anastrozole refill approved.  Patient is tolerating and is to continue therapy.

## 2023-03-04 ENCOUNTER — Ambulatory Visit: Payer: Medicare Other | Attending: Cardiology

## 2023-03-04 DIAGNOSIS — M19031 Primary osteoarthritis, right wrist: Secondary | ICD-10-CM | POA: Diagnosis not present

## 2023-03-04 DIAGNOSIS — Z885 Allergy status to narcotic agent status: Secondary | ICD-10-CM | POA: Diagnosis not present

## 2023-03-04 DIAGNOSIS — I509 Heart failure, unspecified: Secondary | ICD-10-CM | POA: Diagnosis not present

## 2023-03-04 DIAGNOSIS — Z881 Allergy status to other antibiotic agents status: Secondary | ICD-10-CM | POA: Diagnosis not present

## 2023-03-04 DIAGNOSIS — S66911A Strain of unspecified muscle, fascia and tendon at wrist and hand level, right hand, initial encounter: Secondary | ICD-10-CM | POA: Diagnosis not present

## 2023-03-04 DIAGNOSIS — M7989 Other specified soft tissue disorders: Secondary | ICD-10-CM | POA: Diagnosis not present

## 2023-03-04 DIAGNOSIS — Z79899 Other long term (current) drug therapy: Secondary | ICD-10-CM | POA: Diagnosis not present

## 2023-03-04 DIAGNOSIS — S63501A Unspecified sprain of right wrist, initial encounter: Secondary | ICD-10-CM | POA: Diagnosis not present

## 2023-03-04 DIAGNOSIS — I110 Hypertensive heart disease with heart failure: Secondary | ICD-10-CM | POA: Diagnosis not present

## 2023-03-04 DIAGNOSIS — M25531 Pain in right wrist: Secondary | ICD-10-CM | POA: Diagnosis not present

## 2023-03-04 DIAGNOSIS — Z888 Allergy status to other drugs, medicaments and biological substances status: Secondary | ICD-10-CM | POA: Diagnosis not present

## 2023-03-04 DIAGNOSIS — W07XXXA Fall from chair, initial encounter: Secondary | ICD-10-CM | POA: Diagnosis not present

## 2023-03-09 ENCOUNTER — Encounter: Payer: Self-pay | Admitting: Nurse Practitioner

## 2023-03-10 DIAGNOSIS — M25531 Pain in right wrist: Secondary | ICD-10-CM | POA: Diagnosis not present

## 2023-03-10 DIAGNOSIS — Y929 Unspecified place or not applicable: Secondary | ICD-10-CM | POA: Diagnosis not present

## 2023-03-10 DIAGNOSIS — W07XXXA Fall from chair, initial encounter: Secondary | ICD-10-CM | POA: Diagnosis not present

## 2023-03-10 DIAGNOSIS — Y939 Activity, unspecified: Secondary | ICD-10-CM | POA: Diagnosis not present

## 2023-03-10 DIAGNOSIS — S52591A Other fractures of lower end of right radius, initial encounter for closed fracture: Secondary | ICD-10-CM | POA: Diagnosis not present

## 2023-03-11 DIAGNOSIS — S52531S Colles' fracture of right radius, sequela: Secondary | ICD-10-CM | POA: Diagnosis not present

## 2023-03-11 DIAGNOSIS — I10 Essential (primary) hypertension: Secondary | ICD-10-CM | POA: Diagnosis not present

## 2023-03-19 DIAGNOSIS — S52591D Other fractures of lower end of right radius, subsequent encounter for closed fracture with routine healing: Secondary | ICD-10-CM | POA: Diagnosis not present

## 2023-03-20 DIAGNOSIS — I5032 Chronic diastolic (congestive) heart failure: Secondary | ICD-10-CM | POA: Diagnosis not present

## 2023-03-20 DIAGNOSIS — I77810 Thoracic aortic ectasia: Secondary | ICD-10-CM | POA: Diagnosis not present

## 2023-03-21 LAB — BASIC METABOLIC PANEL
BUN/Creatinine Ratio: 21 (ref 12–28)
BUN: 21 mg/dL (ref 8–27)
CO2: 24 mmol/L (ref 20–29)
Calcium: 9.6 mg/dL (ref 8.7–10.3)
Chloride: 102 mmol/L (ref 96–106)
Creatinine, Ser: 1.02 mg/dL — ABNORMAL HIGH (ref 0.57–1.00)
Glucose: 112 mg/dL — ABNORMAL HIGH (ref 70–99)
Potassium: 4.6 mmol/L (ref 3.5–5.2)
Sodium: 142 mmol/L (ref 134–144)
eGFR: 55 mL/min/{1.73_m2} — ABNORMAL LOW (ref >59)

## 2023-04-01 DIAGNOSIS — S52501A Unspecified fracture of the lower end of right radius, initial encounter for closed fracture: Secondary | ICD-10-CM | POA: Diagnosis not present

## 2023-04-01 DIAGNOSIS — M19041 Primary osteoarthritis, right hand: Secondary | ICD-10-CM | POA: Diagnosis not present

## 2023-04-01 DIAGNOSIS — M1811 Unilateral primary osteoarthritis of first carpometacarpal joint, right hand: Secondary | ICD-10-CM | POA: Diagnosis not present

## 2023-04-01 DIAGNOSIS — S52591D Other fractures of lower end of right radius, subsequent encounter for closed fracture with routine healing: Secondary | ICD-10-CM | POA: Diagnosis not present

## 2023-04-01 DIAGNOSIS — S52324A Nondisplaced transverse fracture of shaft of right radius, initial encounter for closed fracture: Secondary | ICD-10-CM | POA: Diagnosis not present

## 2023-04-01 DIAGNOSIS — M189 Osteoarthritis of first carpometacarpal joint, unspecified: Secondary | ICD-10-CM | POA: Diagnosis not present

## 2023-04-07 ENCOUNTER — Ambulatory Visit: Payer: Medicare Other | Attending: Internal Medicine

## 2023-04-07 ENCOUNTER — Telehealth: Payer: Self-pay

## 2023-04-07 DIAGNOSIS — I5032 Chronic diastolic (congestive) heart failure: Secondary | ICD-10-CM | POA: Diagnosis not present

## 2023-04-07 LAB — ECHOCARDIOGRAM COMPLETE
AR max vel: 2.69 cm2
AV Area VTI: 3.13 cm2
AV Area mean vel: 2.96 cm2
AV Mean grad: 4 mm[Hg]
AV Peak grad: 7.6 mm[Hg]
Ao pk vel: 1.38 m/s
Calc EF: 62.1 %
MV VTI: 2.05 cm2
P 1/2 time: 486 ms
S' Lateral: 3.1 cm
Single Plane A2C EF: 59.7 %
Single Plane A4C EF: 66.4 %

## 2023-04-07 NOTE — Telephone Encounter (Addendum)
Device alert for AF ongoing from 12/2, controlled rates Burden 0.5%, Eliquis per PA report 2 NSVT 11/12 @ 23:31, 22 beats in duration - route to triage per protocol LA, CVRS  Burden is stable, on OAC.  Longest NSVT was 40 beats on 03/17/23 2331. (Eipisodes 24,25 run together for 1 complete event).  This is the first event recorded.  (Dual chamber PPM).  Forwarding to Dr. Nelly Laurence as Lorain Childes.

## 2023-04-15 DIAGNOSIS — I10 Essential (primary) hypertension: Secondary | ICD-10-CM | POA: Diagnosis not present

## 2023-04-15 DIAGNOSIS — H8110 Benign paroxysmal vertigo, unspecified ear: Secondary | ICD-10-CM | POA: Diagnosis not present

## 2023-05-01 DIAGNOSIS — S52591D Other fractures of lower end of right radius, subsequent encounter for closed fracture with routine healing: Secondary | ICD-10-CM | POA: Diagnosis not present

## 2023-05-21 DIAGNOSIS — H8110 Benign paroxysmal vertigo, unspecified ear: Secondary | ICD-10-CM | POA: Diagnosis not present

## 2023-05-21 DIAGNOSIS — N390 Urinary tract infection, site not specified: Secondary | ICD-10-CM | POA: Diagnosis not present

## 2023-05-21 DIAGNOSIS — E7849 Other hyperlipidemia: Secondary | ICD-10-CM | POA: Diagnosis not present

## 2023-05-21 DIAGNOSIS — I10 Essential (primary) hypertension: Secondary | ICD-10-CM | POA: Diagnosis not present

## 2023-05-21 DIAGNOSIS — I5022 Chronic systolic (congestive) heart failure: Secondary | ICD-10-CM | POA: Diagnosis not present

## 2023-05-21 DIAGNOSIS — K219 Gastro-esophageal reflux disease without esophagitis: Secondary | ICD-10-CM | POA: Diagnosis not present

## 2023-05-21 DIAGNOSIS — I480 Paroxysmal atrial fibrillation: Secondary | ICD-10-CM | POA: Diagnosis not present

## 2023-05-21 DIAGNOSIS — N1831 Chronic kidney disease, stage 3a: Secondary | ICD-10-CM | POA: Diagnosis not present

## 2023-05-21 DIAGNOSIS — I700 Atherosclerosis of aorta: Secondary | ICD-10-CM | POA: Diagnosis not present

## 2023-05-21 DIAGNOSIS — R0602 Shortness of breath: Secondary | ICD-10-CM | POA: Diagnosis not present

## 2023-05-21 DIAGNOSIS — Z0000 Encounter for general adult medical examination without abnormal findings: Secondary | ICD-10-CM | POA: Diagnosis not present

## 2023-05-27 ENCOUNTER — Encounter (INDEPENDENT_AMBULATORY_CARE_PROVIDER_SITE_OTHER): Payer: Self-pay | Admitting: Gastroenterology

## 2023-06-03 ENCOUNTER — Other Ambulatory Visit: Payer: Self-pay | Admitting: Nurse Practitioner

## 2023-06-09 DIAGNOSIS — J069 Acute upper respiratory infection, unspecified: Secondary | ICD-10-CM | POA: Diagnosis not present

## 2023-06-19 ENCOUNTER — Other Ambulatory Visit (INDEPENDENT_AMBULATORY_CARE_PROVIDER_SITE_OTHER): Payer: Self-pay | Admitting: Gastroenterology

## 2023-06-22 ENCOUNTER — Ambulatory Visit (INDEPENDENT_AMBULATORY_CARE_PROVIDER_SITE_OTHER): Payer: Medicare Other | Admitting: Gastroenterology

## 2023-07-02 ENCOUNTER — Ambulatory Visit (INDEPENDENT_AMBULATORY_CARE_PROVIDER_SITE_OTHER): Payer: Medicare Other | Admitting: Gastroenterology

## 2023-07-08 ENCOUNTER — Ambulatory Visit (INDEPENDENT_AMBULATORY_CARE_PROVIDER_SITE_OTHER): Payer: Medicare Other

## 2023-07-08 DIAGNOSIS — I442 Atrioventricular block, complete: Secondary | ICD-10-CM

## 2023-07-11 LAB — CUP PACEART REMOTE DEVICE CHECK
Battery Remaining Longevity: 108 mo
Battery Voltage: 3.04 V
Brady Statistic AP VP Percent: 55.79 %
Brady Statistic AP VS Percent: 0 %
Brady Statistic AS VP Percent: 44.17 %
Brady Statistic AS VS Percent: 0.03 %
Brady Statistic RA Percent Paced: 52.12 %
Brady Statistic RV Percent Paced: 99.97 %
Date Time Interrogation Session: 20250304195233
Implantable Lead Connection Status: 753985
Implantable Lead Connection Status: 753985
Implantable Lead Implant Date: 20150108
Implantable Lead Implant Date: 20150108
Implantable Lead Location: 753859
Implantable Lead Location: 753860
Implantable Lead Model: 5076
Implantable Lead Model: 5076
Implantable Pulse Generator Implant Date: 20240604
Lead Channel Impedance Value: 285 Ohm
Lead Channel Impedance Value: 285 Ohm
Lead Channel Impedance Value: 323 Ohm
Lead Channel Impedance Value: 380 Ohm
Lead Channel Pacing Threshold Amplitude: 1 V
Lead Channel Pacing Threshold Amplitude: 1.25 V
Lead Channel Pacing Threshold Pulse Width: 0.4 ms
Lead Channel Pacing Threshold Pulse Width: 0.4 ms
Lead Channel Sensing Intrinsic Amplitude: 0.875 mV
Lead Channel Sensing Intrinsic Amplitude: 0.875 mV
Lead Channel Sensing Intrinsic Amplitude: 17.375 mV
Lead Channel Sensing Intrinsic Amplitude: 17.375 mV
Lead Channel Setting Pacing Amplitude: 2.25 V
Lead Channel Setting Pacing Amplitude: 2.75 V
Lead Channel Setting Pacing Pulse Width: 0.4 ms
Lead Channel Setting Sensing Sensitivity: 1.2 mV
Zone Setting Status: 755011
Zone Setting Status: 755011

## 2023-08-04 DIAGNOSIS — N1831 Chronic kidney disease, stage 3a: Secondary | ICD-10-CM | POA: Diagnosis not present

## 2023-08-04 DIAGNOSIS — J40 Bronchitis, not specified as acute or chronic: Secondary | ICD-10-CM | POA: Diagnosis not present

## 2023-08-04 DIAGNOSIS — I10 Essential (primary) hypertension: Secondary | ICD-10-CM | POA: Diagnosis not present

## 2023-08-07 ENCOUNTER — Ambulatory Visit (HOSPITAL_COMMUNITY): Admission: RE | Admit: 2023-08-07 | Payer: Medicare Other | Source: Ambulatory Visit

## 2023-08-18 ENCOUNTER — Ambulatory Visit (HOSPITAL_COMMUNITY)
Admission: RE | Admit: 2023-08-18 | Discharge: 2023-08-18 | Disposition: A | Discharge status: Home / Self Care | Source: Ambulatory Visit | Attending: Cardiology | Admitting: Cardiology

## 2023-08-18 DIAGNOSIS — I7121 Aneurysm of the ascending aorta, without rupture: Secondary | ICD-10-CM | POA: Diagnosis not present

## 2023-08-18 DIAGNOSIS — I728 Aneurysm of other specified arteries: Secondary | ICD-10-CM | POA: Diagnosis not present

## 2023-08-18 DIAGNOSIS — I359 Nonrheumatic aortic valve disorder, unspecified: Secondary | ICD-10-CM | POA: Diagnosis not present

## 2023-08-18 DIAGNOSIS — I517 Cardiomegaly: Secondary | ICD-10-CM | POA: Diagnosis not present

## 2023-08-18 DIAGNOSIS — I77810 Thoracic aortic ectasia: Secondary | ICD-10-CM | POA: Diagnosis not present

## 2023-08-18 MED ORDER — IOHEXOL 350 MG/ML SOLN
80.0000 mL | Freq: Once | INTRAVENOUS | Status: AC | PRN
  Administered 2023-08-18: 80 mL via INTRAVENOUS

## 2023-08-25 NOTE — Progress Notes (Signed)
 Remote pacemaker transmission.

## 2023-08-25 NOTE — Addendum Note (Signed)
 Addended by: Lott Rouleau A on: 08/25/2023 09:35 AM   Modules accepted: Orders

## 2023-09-02 ENCOUNTER — Encounter: Payer: Self-pay | Admitting: Cardiology

## 2023-09-02 ENCOUNTER — Ambulatory Visit: Attending: Cardiology | Admitting: Cardiology

## 2023-09-02 VITALS — BP 166/76 | HR 64 | Ht 63.0 in | Wt 177.0 lb

## 2023-09-02 DIAGNOSIS — I5032 Chronic diastolic (congestive) heart failure: Secondary | ICD-10-CM

## 2023-09-02 DIAGNOSIS — I480 Paroxysmal atrial fibrillation: Secondary | ICD-10-CM | POA: Diagnosis not present

## 2023-09-02 DIAGNOSIS — Z79899 Other long term (current) drug therapy: Secondary | ICD-10-CM

## 2023-09-02 DIAGNOSIS — I77810 Thoracic aortic ectasia: Secondary | ICD-10-CM | POA: Diagnosis not present

## 2023-09-02 MED ORDER — APIXABAN 5 MG PO TABS: 5.0000 mg | ORAL_TABLET | Freq: Two times a day (BID) | ORAL | 6 refills | Status: AC

## 2023-09-02 MED ORDER — DAPAGLIFLOZIN PROPANEDIOL 10 MG PO TABS: 10.0000 mg | ORAL_TABLET | Freq: Every day | ORAL | 6 refills | Status: DC

## 2023-09-02 NOTE — Progress Notes (Signed)
 Cardiology Office Note  Date: 09/02/2023   ID: Richella, Caras Feb 03, 1941, MRN [id number]  History of Present Illness: Alicia Nash is an 83 y.o. female last seen in October 2024.  She is here for a routine visit.  States that she has been doing reasonably well, NYHA class II symptoms.  No peripheral edema or increasing weight.  No exertional chest pain.  Medtronic pacemaker in place with history of complete heart block, followed by Dr. Arlester Ladd.  Device interrogation in March indicated 6.6% AT/AF burden.  We went over her medications.  Discussed increasing Eliquis  to 5 mg twice daily based on renal function.  Also plan to start Farxiga 10 mg daily in light of HFpEF with pulmonary hypertension.  She is using low-dose Lasix  otherwise and remains on Benicar  40 mg daily.  I reviewed her ECG today which shows ventricular pacing.  Physical Exam: VS:  BP (!) 166/76 (BP Location: Left Arm, Patient Position: Sitting, Cuff Size: Large)   Pulse 64   Ht 5\' 3"  (1.6 m)   Wt 177 lb (80.3 kg)   SpO2 96%   BMI 31.35 kg/m , BMI Body mass index is 31.35 kg/m.  Wt Readings from Last 3 Encounters:  09/02/23 177 lb (80.3 kg)  02/17/23 171 lb 9.6 oz (77.8 kg)  02/06/23 172 lb (78 kg)    General: Patient appears comfortable at rest. HEENT: Conjunctiva and lids normal. Neck: Supple, no elevated JVP or carotid bruits. Lungs: Clear to auscultation, nonlabored breathing at rest. Cardiac: Regular rate and rhythm, no S3, 1/6 systolic murmur, no pericardial rub. Extremities: No pitting edema.  ECG:  An ECG dated 05/23/2022 was personally reviewed today and demonstrated:  Ventricular paced rhythm.  Labwork: 09/30/2022: Platelets 221 12/16/2022: Hemoglobin 10.4 03/20/2023: BUN 21; Creatinine, Ser 1.02; Potassium 4.6; Sodium 142  January 2025: BUN 12, creatinine 1.03, GFR 54, potassium 4, cholesterol 137, triglycerides 135, HDL 43, LDL 70, TSH 0.711, NT-proBNP 821  Other Studies Reviewed  Today:  Echocardiogram 04/07/2023:  1. Left ventricular ejection fraction, by estimation, is 60 to 65%. The  left ventricle has normal function. The left ventricle has no regional  wall motion abnormalities. There is mild left ventricular hypertrophy.  Left ventricular diastolic function  could not be evaluated.   2. Right ventricular systolic function is normal. The right ventricular  size is mildly enlarged. There is moderately elevated pulmonary artery  systolic pressure. The estimated right ventricular systolic pressure is  56.6 mmHg.   3. The mitral valve is normal in structure. Mild mitral valve  regurgitation. No evidence of mitral stenosis.   4. The aortic valve is tricuspid. Aortic valve regurgitation is mild. No  aortic stenosis is present.   5. The inferior vena cava is normal in size with greater than 50%  respiratory variability, suggesting right atrial pressure of 3 mmHg.   Assessment and Plan:  1.  HFpEF, LVEF 60 to 65% by echocardiogram in December 2024.  RV function normal with moderately elevated RVSP of 56 mmHg.  Plan to start Farxiga 10 mg daily, otherwise continue Lasix  20 mg daily.  She reports NYHA class II symptoms at this time.   2.  Asymptomatic 4.1 cm ascending thoracic aortic dilatation, stable by follow-up chest CT this April.   3.  Primary hypertension.  Pressure up today, continue to track with PCP.  She is on Benicar  40 mg daily, hydralazine  50 mg 3 times daily (recently increased), and Coreg 12.5 mg twice daily.  4.  Paroxysmal atrial fibrillation with CHA2DS2-VASc score of 5.  She had 6.6 % rhythm burden by most recent device interrogation in March.  Not overly symptomatic in terms of palpitations.  Increase Eliquis  to 5 mg twice daily for stroke prophylaxis.   5.  History of complete heart block with Medtronic pacemaker in place and follow-up by Dr. Arlester Ladd.   6.  Small noncalcified pulmonary nodules, stable by recent chest CT this  April.  Disposition:  Follow up  6 months.  Signed, Gerard Knight, M.D., F.A.C.C. [facility]

## 2023-09-02 NOTE — Patient Instructions (Addendum)
 Medication Instructions:  Your physician has recommended you make the following change in your medication:  Increase eliquis  to 5 mg twice daily Start farxiga 10 mg daily Continue all other medications as prescribed  Labwork: BMET (Due in 6 months just before your next visit) 03/2024 Non-fasting [facility] ([address] [place]) or [facility]  Testing/Procedures: none  Follow-Up: Your physician recommends that you schedule a follow-up appointment in: 6 months  Any Other Special Instructions Will Be Listed Below (If Applicable).  If you need a refill on your cardiac medications before your next appointment, please call your pharmacy.

## 2023-09-03 DIAGNOSIS — N1831 Chronic kidney disease, stage 3a: Secondary | ICD-10-CM | POA: Diagnosis not present

## 2023-09-03 DIAGNOSIS — I10 Essential (primary) hypertension: Secondary | ICD-10-CM | POA: Diagnosis not present

## 2023-09-10 DIAGNOSIS — J4489 Other specified chronic obstructive pulmonary disease: Secondary | ICD-10-CM | POA: Diagnosis not present

## 2023-09-10 DIAGNOSIS — I10 Essential (primary) hypertension: Secondary | ICD-10-CM | POA: Diagnosis not present

## 2023-10-07 ENCOUNTER — Ambulatory Visit (INDEPENDENT_AMBULATORY_CARE_PROVIDER_SITE_OTHER): Payer: Medicare Other

## 2023-10-07 DIAGNOSIS — I5032 Chronic diastolic (congestive) heart failure: Secondary | ICD-10-CM | POA: Diagnosis not present

## 2023-10-07 LAB — CUP PACEART REMOTE DEVICE CHECK
Battery Remaining Longevity: 114 mo
Battery Voltage: 3.02 V
Brady Statistic AP VP Percent: 62.74 %
Brady Statistic AP VS Percent: 0 %
Brady Statistic AS VP Percent: 37.21 %
Brady Statistic AS VS Percent: 0.03 %
Brady Statistic RA Percent Paced: 56.7 %
Brady Statistic RV Percent Paced: 99.97 %
Date Time Interrogation Session: 20250604002113
Implantable Lead Connection Status: 753985
Implantable Lead Connection Status: 753985
Implantable Lead Implant Date: 20150108
Implantable Lead Implant Date: 20150108
Implantable Lead Location: 753859
Implantable Lead Location: 753860
Implantable Lead Model: 5076
Implantable Lead Model: 5076
Implantable Pulse Generator Implant Date: 20240604
Lead Channel Impedance Value: 285 Ohm
Lead Channel Impedance Value: 304 Ohm
Lead Channel Impedance Value: 323 Ohm
Lead Channel Impedance Value: 399 Ohm
Lead Channel Pacing Threshold Amplitude: 1 V
Lead Channel Pacing Threshold Amplitude: 1.25 V
Lead Channel Pacing Threshold Pulse Width: 0.4 ms
Lead Channel Pacing Threshold Pulse Width: 0.4 ms
Lead Channel Sensing Intrinsic Amplitude: 17.375 mV
Lead Channel Sensing Intrinsic Amplitude: 17.375 mV
Lead Channel Sensing Intrinsic Amplitude: 2.125 mV
Lead Channel Sensing Intrinsic Amplitude: 2.125 mV
Lead Channel Setting Pacing Amplitude: 2 V
Lead Channel Setting Pacing Amplitude: 2.75 V
Lead Channel Setting Pacing Pulse Width: 0.4 ms
Lead Channel Setting Sensing Sensitivity: 1.2 mV
Zone Setting Status: 755011
Zone Setting Status: 755011

## 2023-10-08 ENCOUNTER — Ambulatory Visit: Payer: Self-pay | Admitting: Cardiovascular Disease

## 2023-10-08 DIAGNOSIS — J40 Bronchitis, not specified as acute or chronic: Secondary | ICD-10-CM | POA: Diagnosis not present

## 2023-11-30 NOTE — Progress Notes (Signed)
 Remote pacemaker transmission.

## 2023-12-14 DIAGNOSIS — G629 Polyneuropathy, unspecified: Secondary | ICD-10-CM | POA: Diagnosis not present

## 2023-12-16 ENCOUNTER — Other Ambulatory Visit: Payer: Self-pay | Admitting: *Deleted

## 2023-12-29 DIAGNOSIS — J40 Bronchitis, not specified as acute or chronic: Secondary | ICD-10-CM | POA: Diagnosis not present

## 2023-12-29 DIAGNOSIS — N3000 Acute cystitis without hematuria: Secondary | ICD-10-CM | POA: Diagnosis not present

## 2023-12-31 ENCOUNTER — Other Ambulatory Visit: Payer: Self-pay

## 2023-12-31 DIAGNOSIS — C50919 Malignant neoplasm of unspecified site of unspecified female breast: Secondary | ICD-10-CM

## 2024-01-05 ENCOUNTER — Inpatient Hospital Stay: Attending: Physician Assistant

## 2024-01-05 DIAGNOSIS — N1832 Chronic kidney disease, stage 3b: Secondary | ICD-10-CM | POA: Diagnosis not present

## 2024-01-05 DIAGNOSIS — D509 Iron deficiency anemia, unspecified: Secondary | ICD-10-CM | POA: Insufficient documentation

## 2024-01-05 DIAGNOSIS — I480 Paroxysmal atrial fibrillation: Secondary | ICD-10-CM | POA: Insufficient documentation

## 2024-01-05 DIAGNOSIS — D631 Anemia in chronic kidney disease: Secondary | ICD-10-CM | POA: Insufficient documentation

## 2024-01-05 DIAGNOSIS — Z79811 Long term (current) use of aromatase inhibitors: Secondary | ICD-10-CM | POA: Insufficient documentation

## 2024-01-05 DIAGNOSIS — C50919 Malignant neoplasm of unspecified site of unspecified female breast: Secondary | ICD-10-CM

## 2024-01-05 DIAGNOSIS — Z170 Estrogen receptor positive status [ER+]: Secondary | ICD-10-CM | POA: Diagnosis not present

## 2024-01-05 DIAGNOSIS — Z1721 Progesterone receptor positive status: Secondary | ICD-10-CM | POA: Insufficient documentation

## 2024-01-05 DIAGNOSIS — Z1732 Human epidermal growth factor receptor 2 negative status: Secondary | ICD-10-CM | POA: Insufficient documentation

## 2024-01-05 DIAGNOSIS — M85852 Other specified disorders of bone density and structure, left thigh: Secondary | ICD-10-CM | POA: Diagnosis not present

## 2024-01-05 DIAGNOSIS — C50411 Malignant neoplasm of upper-outer quadrant of right female breast: Secondary | ICD-10-CM | POA: Insufficient documentation

## 2024-01-05 DIAGNOSIS — Z7901 Long term (current) use of anticoagulants: Secondary | ICD-10-CM | POA: Insufficient documentation

## 2024-01-05 LAB — COMPREHENSIVE METABOLIC PANEL WITH GFR
ALT: 10 U/L (ref 0–44)
AST: 17 U/L (ref 15–41)
Albumin: 3.7 g/dL (ref 3.5–5.0)
Alkaline Phosphatase: 112 U/L (ref 38–126)
Anion gap: 14 (ref 5–15)
BUN: 23 mg/dL (ref 8–23)
CO2: 23 mmol/L (ref 22–32)
Calcium: 9.5 mg/dL (ref 8.9–10.3)
Chloride: 102 mmol/L (ref 98–111)
Creatinine, Ser: 1.4 mg/dL — ABNORMAL HIGH (ref 0.44–1.00)
GFR, Estimated: 37 mL/min — ABNORMAL LOW (ref >60)
Glucose, Bld: 114 mg/dL — ABNORMAL HIGH (ref 70–99)
Potassium: 3.7 mmol/L (ref 3.5–5.1)
Sodium: 139 mmol/L (ref 135–145)
Total Bilirubin: 0.5 mg/dL (ref 0.0–1.2)
Total Protein: 7.3 g/dL (ref 6.5–8.1)

## 2024-01-05 LAB — CBC WITH DIFFERENTIAL/PLATELET
Abs Immature Granulocytes: 0.02 K/uL (ref 0.00–0.07)
Basophils Absolute: 0.1 K/uL (ref 0.0–0.1)
Basophils Relative: 1 %
Eosinophils Absolute: 0.3 K/uL (ref 0.0–0.5)
Eosinophils Relative: 4 %
HCT: 33.1 % — ABNORMAL LOW (ref 36.0–46.0)
Hemoglobin: 10 g/dL — ABNORMAL LOW (ref 12.0–15.0)
Immature Granulocytes: 0 %
Lymphocytes Relative: 15 %
Lymphs Abs: 1 K/uL (ref 0.7–4.0)
MCH: 29.4 pg (ref 26.0–34.0)
MCHC: 30.2 g/dL (ref 30.0–36.0)
MCV: 97.4 fL (ref 80.0–100.0)
Monocytes Absolute: 0.5 K/uL (ref 0.1–1.0)
Monocytes Relative: 8 %
Neutro Abs: 4.9 K/uL (ref 1.7–7.7)
Neutrophils Relative %: 72 %
Platelets: 259 K/uL (ref 150–400)
RBC: 3.4 MIL/uL — ABNORMAL LOW (ref 3.87–5.11)
RDW: 14.3 % (ref 11.5–15.5)
WBC: 6.8 K/uL (ref 4.0–10.5)
nRBC: 0 % (ref 0.0–0.2)

## 2024-01-05 LAB — FERRITIN: Ferritin: 49 ng/mL (ref 11–307)

## 2024-01-05 LAB — IRON AND TIBC
Iron: 46 ug/dL (ref 28–170)
Saturation Ratios: 12 % (ref 10.4–31.8)
TIBC: 383 ug/dL (ref 250–450)
UIBC: 337 ug/dL

## 2024-01-06 ENCOUNTER — Ambulatory Visit (INDEPENDENT_AMBULATORY_CARE_PROVIDER_SITE_OTHER): Payer: Medicare Other

## 2024-01-06 DIAGNOSIS — I442 Atrioventricular block, complete: Secondary | ICD-10-CM | POA: Diagnosis not present

## 2024-01-07 ENCOUNTER — Ambulatory Visit: Payer: Self-pay | Admitting: Cardiovascular Disease

## 2024-01-07 LAB — CUP PACEART REMOTE DEVICE CHECK
Battery Remaining Longevity: 110 mo
Battery Voltage: 3.02 V
Brady Statistic AP VP Percent: 48.96 %
Brady Statistic AP VS Percent: 0 %
Brady Statistic AS VP Percent: 51 %
Brady Statistic AS VS Percent: 0.02 %
Brady Statistic RA Percent Paced: 43.57 %
Brady Statistic RV Percent Paced: 99.98 %
Date Time Interrogation Session: 20250903045902
Implantable Lead Connection Status: 753985
Implantable Lead Connection Status: 753985
Implantable Lead Implant Date: 20150108
Implantable Lead Implant Date: 20150108
Implantable Lead Location: 753859
Implantable Lead Location: 753860
Implantable Lead Model: 5076
Implantable Lead Model: 5076
Implantable Pulse Generator Implant Date: 20240604
Lead Channel Impedance Value: 285 Ohm
Lead Channel Impedance Value: 304 Ohm
Lead Channel Impedance Value: 323 Ohm
Lead Channel Impedance Value: 380 Ohm
Lead Channel Pacing Threshold Amplitude: 0.875 V
Lead Channel Pacing Threshold Amplitude: 1.375 V
Lead Channel Pacing Threshold Pulse Width: 0.4 ms
Lead Channel Pacing Threshold Pulse Width: 0.4 ms
Lead Channel Sensing Intrinsic Amplitude: 17.375 mV
Lead Channel Sensing Intrinsic Amplitude: 17.375 mV
Lead Channel Sensing Intrinsic Amplitude: 2.25 mV
Lead Channel Sensing Intrinsic Amplitude: 2.25 mV
Lead Channel Setting Pacing Amplitude: 2 V
Lead Channel Setting Pacing Amplitude: 2.75 V
Lead Channel Setting Pacing Pulse Width: 0.4 ms
Lead Channel Setting Sensing Sensitivity: 1.2 mV
Zone Setting Status: 755011
Zone Setting Status: 755011

## 2024-01-09 NOTE — Progress Notes (Unsigned)
 [facility] [address], [place] [address]   CLINIC:  Medical Oncology/Hematology  PCP:  Orpha Yancey LABOR, MD [address] / [place] [address] [phone]   REASON FOR VISIT:  Follow-up for history of stage I right breast lobular carcinoma (2016) + normocytic anemia (CKD/IDA)   PRIOR THERAPY:  Lumpectomy (09/20/2014)   CURRENT THERAPY: Anastrozole  (started 09/28/2014)***  BRIEF ONCOLOGIC HISTORY:   Oncology History  Lobular carcinoma of right breast (HCC)  08/30/2014 Mammogram   Ill-defined shadowing mass at 10:00 position 6 cm from the nipple measuring [phone] cm in size without lymphadenopathy seen in the right axilla.   09/06/2014 Procedure   Biopsy of right breast mass at 10 o'clock position.   09/08/2014 Pathology Results   Invasive carcinoma, favor invasive ductal carcinoma.  Some features are suggestive of lobular carcinoma.  Negative e-cadherin immunostaining throughout the lesional cells supports the diagnosis of invasive lobular carcinoma.   09/08/2014 Pathology Results   ER 90%, PR 90%. HER2 FISH negative, Ki-67 26%.   09/20/2014 Definitive Surgery   Lumpectomy   09/20/2014 Pathology Results   Invasive lobular carcinoma, 0.6 cm in maximal dimension, closest margin is 0.3 cm at posterior resection margin. Grade 2. No LV I. 0/1 lymph nodes.   09/27/2014 -  Anti-estrogen oral therapy   Arimidex    08/08/2015 Mammogram   BIRADS 2   09/20/2015 Procedure   Right breast lumpectomy by Dr. Ivery with sentinel lymph node biopsy.     CANCER STAGING: Cancer Staging  Lobular carcinoma of right breast [facility]) Staging form: Breast, AJCC 7th Edition - Pathologic stage from 09/20/2014: Stage IA (T1b, N0, cM0) - Signed by Berry Debby RAMAN, PA-C on 01/15/2016   INTERVAL HISTORY:   Alicia Nash, a 83 y.o. female, returns for routine follow-up of her stage I right-sided breast cancer and normocytic anemia related to CKD and IDA. Alicia Nash  was last seen on 10/17/2021 by Pleasant Barefoot PA-C.  ***She was lost to follow-up after that visit and returns today to reestablish care.   At today's visit, she  reports feeling ***.   ***She denies any recent hospitalizations, surgeries, or changes in her  baseline health status. She reports ***% energy and ***% appetite.  She is maintaining stable weight at this time.  BREAST CANCER (SURVIVORSHIP): She denies any new breast lumps or axillary lymphadenopathy. ***No new onset bone pain, chest pain, dyspnea, or abdominal pain. ***She has no new headaches, seizures, or focal neurologic deficits. ***No B symptoms such as fever, chills, night sweats, unintentional weight loss. ***She continues to take *** ARIMIDEX :  Hot flashes, mood swings, weight gain, alopecia, and musculoskeletal pain ***She takes weekly vitamin D  ***  NORMOCYTIC ANEMIA (CKD and IDA): *** Bleeding *** Fatigue, pica *** Headaches, lightheadedness, syncope *** Chest pain, dyspnea on exertion ***She remains on Eliquis  for atrial fibrillation.   ASSESSMENT & PLAN:  1.  Stage I (PT 1 BP N0) right breast lobular carcinoma: -Status post lumpectomy on 09/20/2014, 0.6 cm, ER/PR positive, HER-2 negative by FISH, Ki-67 of 26%, Arimidex  started on 09/27/2014.  For some reason she did not receive radiation therapy. - She is tolerating anastrozole  very well without any major problems.  She has occasional mild hot flashes.*** - Mammogram on 11/15/2021 was BI-RADS Category 1, negative - No red flag symptoms concerning for recurrence at this time.   - Physical examination today (01/11/2024): *** No palpable masses.  Right breast upper outer quadrant lumpectomy scar is  within normal limits.  No palpable adenopathy.   - Labs from 01/05/2024 did not show any liver function abnormalities. - PLAN: Continue daily anastrozole .*** - *** She is overdue for mammogram. -***Annual breast exam and office visit.  2.  Normocytic anemia: -This is  from combination of CKD stage IIIb and iron deficiency state. - She reportedly received 4 units of PRBC in March 2024 at [facility].  She had colonoscopy on 07/14/2017 at [facility] which was normal. - Capsule study on 08/26/2017 by Dr. Golda showed few small bowel punctate AVM with speck of blood, no active bleeding. - Last Feraheme  on 10/21/2021 - She has occasional black/tarry bowel movements every few months.  *** - She is taking Eliquis  for paroxysmal atrial fibrillation.*** - She reports worsening fatigue*** - Labs (01/05/2024): Hgb 10.0/MCV 97.4, ferritin 49, iron saturation 12%.  Creatinine 1.40/GFR 37. - PLAN: Due to worsening anemia and functional iron deficiency, recommend Feraheme  x 2 *** - RTC 6 months ***   3.  Osteopenia: - DEXA scan on 08/13/2015 shows T score of -1.7 in the left femoral neck.   - DEXA scan on 04/15/2018 shows T score of -1.7 and stable. - Bone density/DEXA (11/15/2021): T-score -2.0, osteopenic.  FRAX shows 21.4% 10-year probability of major osteoporotic fracture, and 5.9% probability of hip fracture*** - She is taking weekly vitamin D  50,000 units *** - PLAN: She is overdue for bone density scan.  *** Based on results from DEXA obtained before she was lost to follow-up, she will likely benefit from starting on Prolia , which was discussed at this visit.  *** Tentatively plan on starting Prolia  at follow-up visit in 6 months?  *** Pending bone density/DEXA and dental clearance.  *** - Continue vitamin D  weekly.  ***  PLAN SUMMARY: >> *** >> *** >> ***   REVIEW OF SYSTEMS: ***  Review of Systems - Oncology  PHYSICAL EXAM:   Performance status (ECOG): {CHL ONC ED:8845999799} *** There were no vitals filed for this visit. Wt Readings from Last 3 Encounters:  09/02/23 177 lb (80.3 kg)  02/17/23 171 lb 9.6 oz (77.8 kg)  02/06/23 172 lb (78 kg)   Physical Exam   PAST MEDICAL/SURGICAL HISTORY:  Past Medical History:  Diagnosis Date   Arthritis     Cellulitis    Right leg   Hyperlipidemia    Hypertension    Lobular carcinoma of right breast (HCC) 09/27/2014   Malignant neoplasm of upper-outer quadrant of right female breast (HCC) 09/27/2014   Paroxysmal atrial fibrillation (HCC)    Presence of permanent cardiac pacemaker    Pulmonary hypertension (HCC)    Thoracic ascending aortic aneurysm (HCC)    Varicose veins    Past Surgical History:  Procedure Laterality Date   ABDOMINAL HYSTERECTOMY     CHOLECYSTECTOMY     COLONOSCOPY WITH PROPOFOL  N/A 08/05/2022   Procedure: COLONOSCOPY WITH PROPOFOL ;  Surgeon: Eartha Angelia Sieving, MD;  [facility];  Service: Gastroenterology;  Laterality: N/A;  10:30am, asa 3   ESOPHAGOGASTRODUODENOSCOPY N/A 01/13/2018   Procedure: ESOPHAGOGASTRODUODENOSCOPY (EGD);  Surgeon: Golda Claudis PENNER, MD;  Location: [facility];  Service: Endoscopy;  Laterality: N/A;   ESOPHAGOGASTRODUODENOSCOPY (EGD) WITH PROPOFOL  N/A 08/05/2022   Procedure: ESOPHAGOGASTRODUODENOSCOPY (EGD) WITH PROPOFOL ;  Surgeon: Eartha Angelia Sieving, MD;  [facility];  Service: Gastroenterology;  Laterality: N/A;   FOOT SURGERY     Multiple foot surgeries by Dr. Merlynn BOYS CAPSULE STUDY N/A 08/26/2017   Procedure:  GIVENS CAPSULE STUDY;  Surgeon: Golda Claudis PENNER, MD;  [facility]: [facility];  Service: Endoscopy;  Laterality: N/A;  8:30   HOT HEMOSTASIS  08/05/2022   Procedure: HOT HEMOSTASIS (ARGON PLASMA COAGULATION/BICAP);  Surgeon: Eartha Flavors, Toribio, MD;  Location: [facility];  Service: Gastroenterology;;   JOINT REPLACEMENT     bilateral knee by Drs. McGee and McGinley   PACEMAKER IMPLANT Left 05/12/2013   MDT Kelli PPM implanted by Dr Hiram for CHB and SSS   POLYPECTOMY  08/05/2022   Procedure: POLYPECTOMY INTESTINAL;  Surgeon: Eartha Flavors Toribio, MD;  [facility]: [facility];  Service: Gastroenterology;;   [facility] GENERATOR CHANGEOUT N/A 10/07/2022   Procedure: PPM GENERATOR CHANGEOUT;   Surgeon: Nancey Eulas BRAVO, MD;  Location: [facility];  Service: Cardiovascular;  Laterality: N/A;   SHOULDER SURGERY      SOCIAL HISTORY:  Social History   Socioeconomic History   Marital status: Divorced    Spouse name: Not on file   Number of children: Not on file   Years of education: Not on file   Highest education level: Not on file  Occupational History   Not on file  Tobacco Use   Smoking status: Never    Passive exposure: Past   Smokeless tobacco: Never  Vaping Use   Vaping status: Never Used  Substance and Sexual Activity   Alcohol use: No   Drug use: No   Sexual activity: Not on file  Other Topics Concern   Not on file  Social History Narrative   Lives in [place]   Divorced   Retired from [employer] services   Social Drivers of Health   Financial Resource Strain: Low Risk  (09/26/2022)   Received from [facility]   Overall Financial Resource Strain (CARDIA)    Difficulty of Paying Living Expenses: Not hard at all  Food Insecurity: No Food Insecurity (09/26/2022)   Received from [facility]   Hunger Vital Sign    Within the past 12 months, you worried that your food would run out before you got the money to buy more.: Never true    Within the past 12 months, the food you bought just didn't last and you didn't have money to get more.: Never true  Transportation Needs: No Transportation Needs (09/26/2022)   Received from [facility]   PRAPARE - Transportation    Lack of Transportation (Medical): No    Lack of Transportation (Non-Medical): No  Physical Activity: Insufficiently Active (03/08/2020)   Exercise Vital Sign    Days of Exercise per Week: 2 days    Minutes of Exercise per Session: 30 min  Stress: No Stress Concern Present (03/08/2020)   [employer] of Occupational Health - Occupational Stress Questionnaire    Feeling of Stress : Not at all  Social Connections: Moderately Isolated (03/08/2020)   Social Connection  and Isolation Panel    Frequency of Communication with Friends and Family: More than three times a week    Frequency of Social Gatherings with Friends and Family: More than three times a week    Attends Religious Services: More than 4 times per year    Active Member of [employer] or Organizations: No    Attends Banker Meetings: Never    Marital Status: Widowed  Intimate Partner Violence: Not At Risk (03/08/2020)   Humiliation, Afraid, Rape, and Kick questionnaire    Fear of Current or Ex-Partner: No    Emotionally  Abused: No    Physically Abused: No    Sexually Abused: No    FAMILY HISTORY:  Family History  Problem Relation Age of Onset   Hypertension Other    Diabetes Mother     CURRENT MEDICATIONS:  Current Outpatient Medications  Medication Sig Dispense Refill   acetaminophen  (TYLENOL ) 325 MG tablet Take 650 mg by mouth every 6 (six) hours as needed for moderate pain.     acetaZOLAMIDE (DIAMOX) 250 MG tablet Take 250 mg by mouth daily.     albuterol  (VENTOLIN  HFA) 108 (90 Base) MCG/ACT inhaler Inhale 1-2 puffs into the lungs every 6 (six) hours as needed for shortness of breath or wheezing.     anastrozole  (ARIMIDEX ) 1 MG tablet TAKE 1 TABLET BY MOUTH EVERY DAY 30 tablet 5   apixaban  (ELIQUIS ) 5 MG TABS tablet Take 1 tablet (5 mg total) by mouth 2 (two) times daily. 60 tablet 6   carvedilol (COREG) 12.5 MG tablet Take 12.5 mg by mouth 2 (two) times daily with a meal.     citalopram  (CELEXA ) 20 MG tablet Take 20 mg by mouth every morning.      Colchicine 0.6 MG CAPS Take 0.6 mg by mouth daily.     dapagliflozin  propanediol (FARXIGA ) 10 MG TABS tablet Take 1 tablet (10 mg total) by mouth daily. 30 tablet 6   FEROSUL 325 (65 Fe) MG tablet TAKE 1 TABLET BY MOUTH DAILY WITH BREAKFAST. 90 tablet 1   furosemide  (LASIX ) 20 MG tablet Take 20 mg by mouth daily.     gemfibrozil  (LOPID ) 600 MG tablet Take 600 mg by mouth 2 (two) times daily before a meal.     hydrALAZINE   (APRESOLINE ) 50 MG tablet Take 50 mg by mouth 3 (three) times daily.     ipratropium-albuterol  (DUONEB) 0.5-2.5 (3) MG/3ML SOLN Take 3 mLs by nebulization every 6 (six) hours as needed (wheezing).     KRILL OIL PO Take 2,000 mg by mouth daily.     olmesartan  (BENICAR ) 40 MG tablet TAKE ONE TABLET BY MOUTH DAILY 30 tablet 11   pantoprazole  (PROTONIX ) 40 MG tablet Take 40 mg by mouth 2 (two) times daily.     rosuvastatin (CRESTOR) 10 MG tablet Take 10 mg by mouth daily.     TURMERIC PO Take 1 capsule by mouth daily.     No current facility-administered medications for this visit.   Facility-Administered Medications Ordered in Other Visits  Medication Dose Route Frequency Provider Last Rate Last Admin   0.9 %  sodium chloride  infusion   Intravenous Continuous Katragadda, Sreedhar, MD 20 mL/hr at 05/07/18 1447 New Bag at 05/07/18 1447    ALLERGIES:  Allergies  Allergen Reactions   Dihydrocodeine Other (See Comments)    No reaction noted. funny feeling   Allopurinol    Cefuroxime    Fosamax [Alendronate]    Hydrocodone    Other     Blisters after wear compression stockings    LABORATORY DATA:  I have reviewed the labs as listed.     Latest Ref Rng & Units 01/05/2024    2:50 PM 12/16/2022   10:42 AM 09/30/2022   11:23 AM  CBC  WBC 4.0 - 10.5 K/uL 6.8   4.2   Hemoglobin 12.0 - 15.0 g/dL 89.9  89.5  88.4   Hematocrit 36.0 - 46.0 % 33.1  32.8  36.7   Platelets 150 - 400 K/uL 259   221       Latest Ref  Rng & Units 01/05/2024    2:50 PM 03/20/2023    2:07 PM 09/30/2022   11:23 AM  CMP  Glucose 70 - 99 mg/dL 885  887  880   BUN 8 - 23 mg/dL 23  21  42   Creatinine 0.44 - 1.00 mg/dL 8.59  8.97  8.55   Sodium 135 - 145 mmol/L 139  142  137   Potassium 3.5 - 5.1 mmol/L 3.7  4.6  3.9   Chloride 98 - 111 mmol/L 102  102  100   CO2 22 - 32 mmol/L 23  24  21    Calcium 8.9 - 10.3 mg/dL 9.5  9.6  9.2   Total Protein 6.5 - 8.1 g/dL 7.3     Total Bilirubin 0.0 - 1.2 mg/dL 0.5      Alkaline Phos 38 - 126 U/L 112     AST 15 - 41 U/L 17     ALT 0 - 44 U/L 10       DIAGNOSTIC IMAGING:  I have independently reviewed the scans and discussed with the patient. CUP PACEART REMOTE DEVICE CHECK Result Date: 01/07/2024 PPM scheduled remote reviewed. Normal device function.  Presenting rhythm: AP-VP 5 AT/AF events. Longest x 82 hours. On OAC per chart. Next remote 91 days. AB, CVRS    WRAP UP:  All questions were answered. The patient knows to call the clinic with any problems, questions or concerns.  Medical decision making: ***  Time spent on visit: I spent {CHL ONC TIME VISIT - DTPQU:8845999869} counseling the patient face to face. The total time spent in the appointment was {CHL ONC TIME VISIT - DTPQU:[id number]} and more than 50% was on counseling.  Pleasant CHRISTELLA Barefoot, PA-C  ***

## 2024-01-11 ENCOUNTER — Inpatient Hospital Stay (HOSPITAL_BASED_OUTPATIENT_CLINIC_OR_DEPARTMENT_OTHER): Admitting: Physician Assistant

## 2024-01-11 VITALS — BP 156/55 | HR 58 | Temp 98.7°F | Resp 18 | Wt 177.0 lb

## 2024-01-11 DIAGNOSIS — C50411 Malignant neoplasm of upper-outer quadrant of right female breast: Secondary | ICD-10-CM

## 2024-01-11 DIAGNOSIS — Z1721 Progesterone receptor positive status: Secondary | ICD-10-CM | POA: Diagnosis not present

## 2024-01-11 DIAGNOSIS — M85852 Other specified disorders of bone density and structure, left thigh: Secondary | ICD-10-CM | POA: Diagnosis not present

## 2024-01-11 DIAGNOSIS — D509 Iron deficiency anemia, unspecified: Secondary | ICD-10-CM | POA: Diagnosis not present

## 2024-01-11 DIAGNOSIS — M8589 Other specified disorders of bone density and structure, multiple sites: Secondary | ICD-10-CM | POA: Diagnosis not present

## 2024-01-11 DIAGNOSIS — Z79811 Long term (current) use of aromatase inhibitors: Secondary | ICD-10-CM

## 2024-01-11 DIAGNOSIS — N1832 Chronic kidney disease, stage 3b: Secondary | ICD-10-CM | POA: Diagnosis not present

## 2024-01-11 DIAGNOSIS — I480 Paroxysmal atrial fibrillation: Secondary | ICD-10-CM | POA: Diagnosis not present

## 2024-01-11 DIAGNOSIS — Z170 Estrogen receptor positive status [ER+]: Secondary | ICD-10-CM

## 2024-01-11 DIAGNOSIS — Z1732 Human epidermal growth factor receptor 2 negative status: Secondary | ICD-10-CM | POA: Diagnosis not present

## 2024-01-11 DIAGNOSIS — Z7901 Long term (current) use of anticoagulants: Secondary | ICD-10-CM | POA: Diagnosis not present

## 2024-01-11 DIAGNOSIS — C50911 Malignant neoplasm of unspecified site of right female breast: Secondary | ICD-10-CM

## 2024-01-11 DIAGNOSIS — D631 Anemia in chronic kidney disease: Secondary | ICD-10-CM

## 2024-01-11 MED ORDER — ANASTROZOLE 1 MG PO TABS
1.0000 mg | ORAL_TABLET | Freq: Every day | ORAL | 5 refills | Status: AC
Start: 2024-01-11 — End: ?

## 2024-01-11 NOTE — Patient Instructions (Signed)
 [facility] at [facility] **VISIT SUMMARY & IMPORTANT INSTRUCTIONS **   You were seen today by Pleasant Barefoot PA-C for your follow-up visit.    HISTORY OF BREAST CANCER You did not have any obvious signs of recurrent breast cancer based on your physical exam today. You are due for your annual mammogram. Continue taking anastrozole  (Arimidex ) breast cancer pill daily.  ANEMIA Your anemia is related to iron deficiency and chronic kidney disease. We will schedule you for IV iron x 2. You can decrease your iron tablet to take this EVERY OTHER DAY.  OSTEOPENIA You are due for bone density scan. Previous bone density scans have shown weak and fragile bones (called osteopenia).  This is related to age and being postmenopausal, but can also be a side effect of your breast cancer pill. Osteopenia increases your risk of breaking a bone. Please restart vitamin D  taking 1000 units daily.  This is available over-the-counter. At your follow-up visit in 3 months, we will start you on injections called Prolia  (denosumab ).  This will help to treat your osteopenia.  Please see the attached handout for important information regarding Prolia  and potential side effects. Before starting Prolia , you will need to see a dentist to obtain DENTAL CLEARANCE.  Please have your dentist to fax us  a letter stating that it is safe for you to start Prolia .  This can be faxed to our office at [phone].  FOLLOW-UP APPOINTMENT: 3 months  ** Thank you for trusting me with your healthcare!  I strive to provide all of my patients with quality care at each visit.  If you receive a survey for this visit, I would be so grateful to you for taking the time to provide feedback.  Thank you in advance!  ~ Shylie Polo                                        Dr. Mickiel Davonna Pleasant Barefoot, PA-C          Delon Hope, NP   - - - - - - - - - - - - - - - - - -    Thank you for choosing [facility] at [facility] to provide your oncology and hematology care.  To afford each patient quality time with our provider, please arrive at least 15 minutes before your scheduled appointment time.   If you have a lab appointment with the Cancer Center please come in thru the Main Entrance and check in at the main information desk.  You need to re-schedule your appointment should you arrive 10 or more minutes late.  We strive to give you quality time with our providers, and arriving late affects you and other patients whose appointments are after yours.  Also, if you no show three or more times for appointments you may be dismissed from the clinic at the providers discretion.     Again, thank you for choosing [facility].  Our hope is that these requests will decrease the amount of time that you wait before being seen by our physicians.       _____________________________________________________________  Should you have questions after your visit to [facility], please contact our office at [phone] and follow the prompts.  Our office hours are 8:00 a.m. and 4:30  p.m. Monday - Friday.  Please note that voicemails left after 4:00 p.m. may not be returned until the following business day.  We are closed weekends and major holidays.  You do have access to a nurse 24-7, just call the main number to the clinic [phone] and do not press any options, hold on the line and a nurse will answer the phone.    For prescription refill requests, have your pharmacy contact our office and allow 72 hours.

## 2024-01-11 NOTE — Addendum Note (Signed)
 Addended by: LAMON HERTER on: 01/11/2024 02:44 PM   Modules accepted: Orders

## 2024-01-14 ENCOUNTER — Inpatient Hospital Stay

## 2024-01-14 VITALS — BP 158/64 | HR 60 | Temp 97.0°F | Resp 19

## 2024-01-14 DIAGNOSIS — I480 Paroxysmal atrial fibrillation: Secondary | ICD-10-CM | POA: Diagnosis not present

## 2024-01-14 DIAGNOSIS — M85852 Other specified disorders of bone density and structure, left thigh: Secondary | ICD-10-CM | POA: Diagnosis not present

## 2024-01-14 DIAGNOSIS — D508 Other iron deficiency anemias: Secondary | ICD-10-CM

## 2024-01-14 DIAGNOSIS — N1832 Chronic kidney disease, stage 3b: Secondary | ICD-10-CM | POA: Diagnosis not present

## 2024-01-14 DIAGNOSIS — Z1721 Progesterone receptor positive status: Secondary | ICD-10-CM | POA: Diagnosis not present

## 2024-01-14 DIAGNOSIS — C50411 Malignant neoplasm of upper-outer quadrant of right female breast: Secondary | ICD-10-CM | POA: Diagnosis not present

## 2024-01-14 DIAGNOSIS — D509 Iron deficiency anemia, unspecified: Secondary | ICD-10-CM | POA: Diagnosis not present

## 2024-01-14 DIAGNOSIS — Z170 Estrogen receptor positive status [ER+]: Secondary | ICD-10-CM | POA: Diagnosis not present

## 2024-01-14 DIAGNOSIS — Z7901 Long term (current) use of anticoagulants: Secondary | ICD-10-CM | POA: Diagnosis not present

## 2024-01-14 DIAGNOSIS — Z1732 Human epidermal growth factor receptor 2 negative status: Secondary | ICD-10-CM | POA: Diagnosis not present

## 2024-01-14 DIAGNOSIS — Z79811 Long term (current) use of aromatase inhibitors: Secondary | ICD-10-CM | POA: Diagnosis not present

## 2024-01-14 DIAGNOSIS — D631 Anemia in chronic kidney disease: Secondary | ICD-10-CM | POA: Diagnosis not present

## 2024-01-14 MED ORDER — SODIUM CHLORIDE 0.9 % IV SOLN: INTRAVENOUS | Status: DC

## 2024-01-14 MED ORDER — CETIRIZINE HCL 10 MG/ML IV SOLN
5.0000 mg | Freq: Once | INTRAVENOUS | Status: AC
  Administered 2024-01-14: 5 mg via INTRAVENOUS
  Filled 2024-01-14: qty 1

## 2024-01-14 MED ORDER — SODIUM CHLORIDE 0.9 % IV SOLN
510.0000 mg | Freq: Once | INTRAVENOUS | Status: AC
  Administered 2024-01-14: 510 mg via INTRAVENOUS
  Filled 2024-01-14: qty 510

## 2024-01-14 MED ORDER — ACETAMINOPHEN 325 MG PO TABS
650.0000 mg | ORAL_TABLET | Freq: Once | ORAL | Status: AC
  Administered 2024-01-14: 650 mg via ORAL
  Filled 2024-01-14: qty 2

## 2024-01-14 NOTE — Progress Notes (Signed)
 Patient tolerated iron  infusion with no complaints voiced.  Peripheral IV site clean and dry with good blood return noted before and after infusion.  Pt observed for 30 minutes post iron  infusion without any complications. VSS with discharge and left in satisfactory condition with no s/s of distress noted. All follow ups as scheduled.  Alicia Nash

## 2024-01-14 NOTE — Patient Instructions (Signed)

## 2024-01-16 NOTE — Progress Notes (Signed)
 Remote PPM Transmission

## 2024-01-20 ENCOUNTER — Ambulatory Visit (HOSPITAL_COMMUNITY)
Admission: RE | Admit: 2024-01-20 | Discharge: 2024-01-20 | Disposition: A | Discharge status: Home / Self Care | Source: Ambulatory Visit | Attending: Physician Assistant | Admitting: Physician Assistant

## 2024-01-20 DIAGNOSIS — Z853 Personal history of malignant neoplasm of breast: Secondary | ICD-10-CM | POA: Diagnosis not present

## 2024-01-20 DIAGNOSIS — Z1231 Encounter for screening mammogram for malignant neoplasm of breast: Secondary | ICD-10-CM | POA: Insufficient documentation

## 2024-01-20 DIAGNOSIS — M8589 Other specified disorders of bone density and structure, multiple sites: Secondary | ICD-10-CM | POA: Insufficient documentation

## 2024-01-20 DIAGNOSIS — C50911 Malignant neoplasm of unspecified site of right female breast: Secondary | ICD-10-CM

## 2024-01-20 DIAGNOSIS — Z780 Asymptomatic menopausal state: Secondary | ICD-10-CM | POA: Diagnosis not present

## 2024-01-21 ENCOUNTER — Inpatient Hospital Stay

## 2024-01-28 DIAGNOSIS — N3091 Cystitis, unspecified with hematuria: Secondary | ICD-10-CM | POA: Diagnosis not present

## 2024-02-05 ENCOUNTER — Encounter: Payer: Self-pay | Admitting: Cardiovascular Disease

## 2024-02-05 ENCOUNTER — Ambulatory Visit: Payer: Medicare Other | Attending: Cardiovascular Disease | Admitting: Cardiovascular Disease

## 2024-02-05 VITALS — BP 126/70 | HR 62 | Ht 63.0 in | Wt 179.8 lb

## 2024-02-05 DIAGNOSIS — I5032 Chronic diastolic (congestive) heart failure: Secondary | ICD-10-CM

## 2024-02-05 DIAGNOSIS — I442 Atrioventricular block, complete: Secondary | ICD-10-CM

## 2024-02-05 LAB — CUP PACEART INCLINIC DEVICE CHECK
Date Time Interrogation Session: 20251003131434
Implantable Lead Connection Status: 753985
Implantable Lead Connection Status: 753985
Implantable Lead Implant Date: 20150108
Implantable Lead Implant Date: 20150108
Implantable Lead Location: 753859
Implantable Lead Location: 753860
Implantable Lead Model: 5076
Implantable Lead Model: 5076
Implantable Pulse Generator Implant Date: 20240604

## 2024-02-05 NOTE — Progress Notes (Signed)
    PCP: Orpha Yancey LABOR, MD Primary Cardiologist: Dr Debera Primary EP:  Dr Nash Almarie Alicia Nash is a 83 y.o. female who presents today for routine electrophysiology followup.  Since last being seen in our clinic, the patient reports doing reasonably well.    She underwent an uneventful generator change on October 07, 2022  Today, she denies symptoms of palpitations, chest pain,  dizziness, presyncope, or syncope.  The patient is otherwise without complaint today.     Physical Exam: Vitals:   02/05/24 0947  Weight: 179 lb 12.8 oz (81.6 kg)  Height: 5' 3 (1.6 m)     Gen: Appears comfortable, well-nourished CV: RRR, no dependent edema The device site is normal -- no tenderness, edema, drainage, redness, threatened erosion. Pulm: breathing easily  Pacemaker interrogation- reviewed in detail today,  See PACEART report      Echo 01/01/21 shows EF 60%, pulmonary htn, moderate to severe LA enlargement, grade II diastolic dysfunction  Echo 10/2021 shows EF 60-65%, normal RVSP  Assessment and Plan:  Symptomatic sinus bradycardia and complete heart block Normal pacemaker function See Pace Art report No changes today she is device dependant today  Paroxymal atrial fibrillation  Burden about 5% (previously 2%) Continue eliquis   HTN Stable No change required today  CHFpEF Echo 12/24 reviewed - EF normal Appears compensated and euvolemic today  Medtronic dual chamber pacemaker -- normal function.  Return in a year  Alicia FORBES Nancey, MD 02/05/2024 9:50 AM

## 2024-02-05 NOTE — Patient Instructions (Signed)

## 2024-02-13 ENCOUNTER — Encounter: Payer: Self-pay | Admitting: Physician Assistant

## 2024-02-15 ENCOUNTER — Encounter: Payer: Self-pay | Admitting: Physician Assistant

## 2024-02-15 ENCOUNTER — Ambulatory Visit: Payer: Self-pay | Admitting: Cardiovascular Disease

## 2024-02-16 ENCOUNTER — Encounter

## 2024-02-17 DIAGNOSIS — N1831 Chronic kidney disease, stage 3a: Secondary | ICD-10-CM | POA: Diagnosis not present

## 2024-02-17 DIAGNOSIS — I480 Paroxysmal atrial fibrillation: Secondary | ICD-10-CM | POA: Diagnosis not present

## 2024-02-17 DIAGNOSIS — I82409 Acute embolism and thrombosis of unspecified deep veins of unspecified lower extremity: Secondary | ICD-10-CM | POA: Diagnosis not present

## 2024-02-17 DIAGNOSIS — I5031 Acute diastolic (congestive) heart failure: Secondary | ICD-10-CM | POA: Diagnosis not present

## 2024-02-17 DIAGNOSIS — I872 Venous insufficiency (chronic) (peripheral): Secondary | ICD-10-CM | POA: Diagnosis not present

## 2024-02-17 DIAGNOSIS — J309 Allergic rhinitis, unspecified: Secondary | ICD-10-CM | POA: Diagnosis not present

## 2024-03-01 DIAGNOSIS — M79604 Pain in right leg: Secondary | ICD-10-CM | POA: Diagnosis not present

## 2024-03-01 DIAGNOSIS — M7989 Other specified soft tissue disorders: Secondary | ICD-10-CM | POA: Diagnosis not present

## 2024-03-03 DIAGNOSIS — H81319 Aural vertigo, unspecified ear: Secondary | ICD-10-CM | POA: Diagnosis not present

## 2024-03-10 ENCOUNTER — Encounter (INDEPENDENT_AMBULATORY_CARE_PROVIDER_SITE_OTHER): Payer: Self-pay | Admitting: Gastroenterology

## 2024-03-30 ENCOUNTER — Encounter: Payer: Self-pay | Admitting: Physician Assistant

## 2024-04-04 ENCOUNTER — Other Ambulatory Visit: Payer: Self-pay | Admitting: Cardiology

## 2024-04-06 ENCOUNTER — Ambulatory Visit: Payer: Medicare Other

## 2024-04-06 DIAGNOSIS — I442 Atrioventricular block, complete: Secondary | ICD-10-CM

## 2024-04-07 ENCOUNTER — Other Ambulatory Visit: Payer: Self-pay | Admitting: Cardiology

## 2024-04-07 ENCOUNTER — Ambulatory Visit: Payer: Self-pay | Admitting: Cardiovascular Disease

## 2024-04-07 LAB — CUP PACEART REMOTE DEVICE CHECK
Battery Remaining Longevity: 107 mo
Battery Voltage: 3.02 V
Brady Statistic AP VP Percent: 51.17 %
Brady Statistic AP VS Percent: 0 %
Brady Statistic AS VP Percent: 48.76 %
Brady Statistic AS VS Percent: 0.05 %
Brady Statistic RA Percent Paced: 44.96 %
Brady Statistic RV Percent Paced: 99.95 %
Date Time Interrogation Session: 20251203012307
Implantable Lead Connection Status: 753985
Implantable Lead Connection Status: 753985
Implantable Lead Implant Date: 20150108
Implantable Lead Implant Date: 20150108
Implantable Lead Location: 753859
Implantable Lead Location: 753860
Implantable Lead Model: 5076
Implantable Lead Model: 5076
Implantable Pulse Generator Implant Date: 20240604
Lead Channel Impedance Value: 304 Ohm
Lead Channel Impedance Value: 304 Ohm
Lead Channel Impedance Value: 323 Ohm
Lead Channel Impedance Value: 361 Ohm
Lead Channel Pacing Threshold Amplitude: 0.75 V
Lead Channel Pacing Threshold Amplitude: 1 V
Lead Channel Pacing Threshold Pulse Width: 0.4 ms
Lead Channel Pacing Threshold Pulse Width: 0.4 ms
Lead Channel Sensing Intrinsic Amplitude: 1 mV
Lead Channel Sensing Intrinsic Amplitude: 1 mV
Lead Channel Sensing Intrinsic Amplitude: 17.375 mV
Lead Channel Sensing Intrinsic Amplitude: 17.375 mV
Lead Channel Setting Pacing Amplitude: 2 V
Lead Channel Setting Pacing Amplitude: 2.75 V
Lead Channel Setting Pacing Pulse Width: 0.4 ms
Lead Channel Setting Sensing Sensitivity: 1.2 mV
Zone Setting Status: 755011
Zone Setting Status: 755011

## 2024-04-12 NOTE — Progress Notes (Signed)
 Remote PPM Transmission

## 2024-04-26 ENCOUNTER — Inpatient Hospital Stay: Attending: Physician Assistant

## 2024-04-26 DIAGNOSIS — M85852 Other specified disorders of bone density and structure, left thigh: Secondary | ICD-10-CM | POA: Insufficient documentation

## 2024-04-26 DIAGNOSIS — D631 Anemia in chronic kidney disease: Secondary | ICD-10-CM | POA: Diagnosis not present

## 2024-04-26 DIAGNOSIS — Z170 Estrogen receptor positive status [ER+]: Secondary | ICD-10-CM | POA: Diagnosis not present

## 2024-04-26 DIAGNOSIS — C50411 Malignant neoplasm of upper-outer quadrant of right female breast: Secondary | ICD-10-CM | POA: Insufficient documentation

## 2024-04-26 DIAGNOSIS — Z1721 Progesterone receptor positive status: Secondary | ICD-10-CM | POA: Insufficient documentation

## 2024-04-26 DIAGNOSIS — Z7901 Long term (current) use of anticoagulants: Secondary | ICD-10-CM | POA: Diagnosis not present

## 2024-04-26 DIAGNOSIS — Z79811 Long term (current) use of aromatase inhibitors: Secondary | ICD-10-CM | POA: Insufficient documentation

## 2024-04-26 DIAGNOSIS — C50911 Malignant neoplasm of unspecified site of right female breast: Secondary | ICD-10-CM

## 2024-04-26 DIAGNOSIS — M8589 Other specified disorders of bone density and structure, multiple sites: Secondary | ICD-10-CM

## 2024-04-26 DIAGNOSIS — E559 Vitamin D deficiency, unspecified: Secondary | ICD-10-CM | POA: Insufficient documentation

## 2024-04-26 DIAGNOSIS — Z1732 Human epidermal growth factor receptor 2 negative status: Secondary | ICD-10-CM | POA: Diagnosis not present

## 2024-04-26 DIAGNOSIS — D500 Iron deficiency anemia secondary to blood loss (chronic): Secondary | ICD-10-CM | POA: Diagnosis not present

## 2024-04-26 DIAGNOSIS — N1832 Chronic kidney disease, stage 3b: Secondary | ICD-10-CM | POA: Diagnosis not present

## 2024-04-26 DIAGNOSIS — I480 Paroxysmal atrial fibrillation: Secondary | ICD-10-CM | POA: Diagnosis not present

## 2024-04-26 LAB — COMPREHENSIVE METABOLIC PANEL WITH GFR
ALT: 8 U/L (ref 0–44)
AST: 19 U/L (ref 15–41)
Albumin: 4.1 g/dL (ref 3.5–5.0)
Alkaline Phosphatase: 107 U/L (ref 38–126)
Anion gap: 15 (ref 5–15)
BUN: 32 mg/dL — ABNORMAL HIGH (ref 8–23)
CO2: 22 mmol/L (ref 22–32)
Calcium: 8.6 mg/dL — ABNORMAL LOW (ref 8.9–10.3)
Chloride: 105 mmol/L (ref 98–111)
Creatinine, Ser: 1.53 mg/dL — ABNORMAL HIGH (ref 0.44–1.00)
GFR, Estimated: 33 mL/min — ABNORMAL LOW
Glucose, Bld: 103 mg/dL — ABNORMAL HIGH (ref 70–99)
Potassium: 3.9 mmol/L (ref 3.5–5.1)
Sodium: 142 mmol/L (ref 135–145)
Total Bilirubin: 0.2 mg/dL (ref 0.0–1.2)
Total Protein: 6.7 g/dL (ref 6.5–8.1)

## 2024-04-26 LAB — CBC WITH DIFFERENTIAL/PLATELET
Abs Immature Granulocytes: 0.03 K/uL (ref 0.00–0.07)
Basophils Absolute: 0.1 K/uL (ref 0.0–0.1)
Basophils Relative: 1 %
Eosinophils Absolute: 0.2 K/uL (ref 0.0–0.5)
Eosinophils Relative: 4 %
HCT: 30.5 % — ABNORMAL LOW (ref 36.0–46.0)
Hemoglobin: 9.2 g/dL — ABNORMAL LOW (ref 12.0–15.0)
Immature Granulocytes: 1 %
Lymphocytes Relative: 16 %
Lymphs Abs: 0.9 K/uL (ref 0.7–4.0)
MCH: 28.4 pg (ref 26.0–34.0)
MCHC: 30.2 g/dL (ref 30.0–36.0)
MCV: 94.1 fL (ref 80.0–100.0)
Monocytes Absolute: 0.6 K/uL (ref 0.1–1.0)
Monocytes Relative: 11 %
Neutro Abs: 3.8 K/uL (ref 1.7–7.7)
Neutrophils Relative %: 67 %
Platelets: 242 K/uL (ref 150–400)
RBC: 3.24 MIL/uL — ABNORMAL LOW (ref 3.87–5.11)
RDW: 15 % (ref 11.5–15.5)
WBC: 5.7 K/uL (ref 4.0–10.5)
nRBC: 0 % (ref 0.0–0.2)

## 2024-04-26 LAB — FERRITIN: Ferritin: 43 ng/mL (ref 11–307)

## 2024-04-26 LAB — VITAMIN D 25 HYDROXY (VIT D DEFICIENCY, FRACTURES): Vit D, 25-Hydroxy: 28.9 ng/mL — ABNORMAL LOW (ref 30–100)

## 2024-04-26 LAB — IRON AND TIBC
Iron: 30 ug/dL (ref 28–170)
Saturation Ratios: 8 % — ABNORMAL LOW (ref 10.4–31.8)
TIBC: 361 ug/dL (ref 250–450)
UIBC: 331 ug/dL

## 2024-05-02 NOTE — Progress Notes (Unsigned)
 "  [facility] [address], [place] [address]   CLINIC:  Medical Oncology/Hematology  PCP:  Orpha Yancey LABOR, MD [address] / [place] [phone]   REASON FOR VISIT:  Follow-up for history of stage I right breast lobular carcinoma (2016) + normocytic anemia (CKD/IDA)   PRIOR THERAPY:  Lumpectomy (09/20/2014)   CURRENT THERAPY: Anastrozole  (started 09/28/2014)  BRIEF ONCOLOGIC HISTORY:   Oncology History  Lobular carcinoma of right breast (HCC)  08/30/2014 Mammogram   Ill-defined shadowing mass at 10:00 position 6 cm from the nipple measuring [phone] cm in size without lymphadenopathy seen in the right axilla.   09/06/2014 Procedure   Biopsy of right breast mass at 10 o'clock position.   09/08/2014 Pathology Results   Invasive carcinoma, favor invasive ductal carcinoma.  Some features are suggestive of lobular carcinoma.  Negative e-cadherin immunostaining throughout the lesional cells supports the diagnosis of invasive lobular carcinoma.   09/08/2014 Pathology Results   ER 90%, PR 90%. HER2 FISH negative, Ki-67 26%.   09/20/2014 Definitive Surgery   Lumpectomy   09/20/2014 Pathology Results   Invasive lobular carcinoma, 0.6 cm in maximal dimension, closest margin is 0.3 cm at posterior resection margin. Grade 2. No LV I. 0/1 lymph nodes.   09/27/2014 -  Anti-estrogen oral therapy   Arimidex    08/08/2015 Mammogram   BIRADS 2   09/20/2015 Procedure   Right breast lumpectomy by Dr. Ivery with sentinel lymph node biopsy.     CANCER STAGING:  Cancer Staging  Lobular carcinoma of right breast [facility]) Staging form: Breast, AJCC 7th Edition - Pathologic stage from 09/20/2014: Stage IA (T1b, N0, cM0) - Signed by Berry Debby RAMAN, PA-C on 01/15/2016   INTERVAL HISTORY:   Ms. WAJIHA VERSTEEG, a 83 y.o. female, returns for routine follow-up of her stage I right-sided breast cancer and normocytic anemia related to CKD and IDA.  Mollyann  was last seen on 01/11/2024 by Pleasant Barefoot PA-C.    At today's visit, she  reports feeling fair.   She denies any recent hospitalizations, surgeries, or changes in her  baseline health status.  She reports 70% energy and 100% appetite.  She is maintaining stable weight at this time.  BREAST CANCER (SURVIVORSHIP): She denies any new breast lumps or axillary lymphadenopathy. No new onset aches or pains. She continues to take Arimidex , with occasional mild hot flashes.  She restarted vitamin D  1000 units daily at visit in September 2025.  NORMOCYTIC ANEMIA (CKD and IDA): She reports feeling some improved energy after IV Feraheme  on 01/14/2024.  She was no-show for her second dose of Feraheme  due to forgetting about it. She takes daily iron tablet, reports some dark stools related to this.  No frank hematochezia or melena. She reports some mild to moderate fatigue.   No ice pica, abnormal headaches, lightheadedness, syncope, chest pain, or dyspnea on exertion. She remains on Eliquis  for atrial fibrillation.  ASSESSMENT & PLAN:  1.  Stage I (PT 1 BP N0) right breast lobular carcinoma: -Status post lumpectomy on 09/20/2014, 0.6 cm, ER/PR positive, HER-2 negative by FISH, Ki-67 of 26%, Arimidex  started on 09/27/2014.  For some reason she did not receive radiation therapy. - She is tolerating anastrozole  very well without any major problems.  She has occasional mild hot flashes. - Most recent screening mammogram (01/20/2024) was BI-RADS Category 1, negative - No red flag symptoms concerning for recurrence at this time.   - Physical examination (  01/11/2024): RIGHT breast with what feels to be soft tissue or fluid-filled cyst -- soft and freely mobile, nontender, rounded margins, approximately 2 cm in diameter, 2 cm lateral to right nipple in the 9 o'clock position.  Right breast upper outer quadrant lumpectomy scar is within normal limits.  No left breast abnormalities.  No palpable adenopathy in  bilateral axillae.   - Labs from 01/05/2024 did not show any liver function abnormalities. - PLAN: Continue daily anastrozole  through May 2026. - Annual mammogram and breast exam due in September 2026.  2.  Normocytic anemia: # Iron deficiency anemia from chronic blood loss # Anemia of CKD stage IIIb -This is from combination of CKD stage IIIb and iron deficiency state. - She reportedly received 4 units of PRBC in March 2024 at [facility].   - Upper endoscopy and colonoscopy (08/05/2022): GAVE without bleeding, treated with APC.  Polyps x 3, diverticulosis. - Most recent IV Feraheme  x 1 on 01/14/2024.  She was no-show for her second dose of Feraheme  in September 2025.   - She has occasional black/tarry bowel movements every few months.  No frank hematochezia. - She takes iron tablet daily. - She is taking Eliquis  for paroxysmal atrial fibrillation. - She reports intermittent fatigue - Labs (04/26/2024): Hgb 9.2/MCV 94.1, ferritin 43, iron saturation 8%.  Creatinine 1.53/GFR 33. - PLAN: Due to worsening anemia and functional iron deficiency, recommend Feraheme  x 2.  Patient educated on importance of compliance with receiving both doses of IV Feraheme . - She can decrease her ferrous sulfate  to every other day for better tolerability. - PCP is managing her CKD. - Previously followed by gastroenterology (NP Mitzie Boettcher), but patient had canceled her visits from 2025 and was lost to follow-up.  Recommended that she reestablish care with GI. - RTC 3 months - Consider starting ESA if hemoglobin does not improve to >10 after iron repletion.   3.  Osteopenia: - DEXA scan on 08/13/2015 shows T score of -1.7 in the left femoral neck.   - DEXA scan on 04/15/2018 shows T score of -1.7 and stable. - DEXA (11/15/2021): T-score -2.0, osteopenic.   - Most recent DEXA/bone density (01/20/2024): T-score -2.2 (left femoral neck).  FRAX shows 23.6% risk of major osteoporotic fracture and 7.2% risk of hip  fracture within the next 10 years. - She has had two falls within the past year that resulted in fracture (left ankle in June 2024, right wrist January 2025). - She is taking vitamin D  1000 units daily. - Labs from 04/26/2024 show mildly low vitamin D  28.9. - PLAN: Discussed Prolia  injections at last office visit.  She states today that she does NOT want Prolia  or bisphosphonate therapy. - We will continue surveillance of osteopenia with repeat bone density scan in September 2027. - INCREASE vitamin D  to take 2000 units daily - Recheck vitamin D  at follow-up.  PLAN SUMMARY: SCHEDULERS: Patient having a hard time remembering her appointments - please highlight her Cancer Center appointments on discharge paperwork ** Patient REFUSED Prolia   >> IV Feraheme  x 2 >> Referral sent to reestablish care with gastroenterology >> Labs in 3 months = CBC/D, CMP, ferritin, iron/TIBC, vitamin D  >> OFFICE visit in 3 months (1 week after labs)   AFTER NEXT VISIT: - Mammogram due September 2026 - Bone density scan due September 2027    REVIEW OF SYSTEMS:   Review of Systems  Constitutional:  Positive for fatigue. Negative for appetite change, chills, diaphoresis, fever and unexpected weight change.  HENT:   Negative for lump/mass and nosebleeds.   Eyes:  Negative for eye problems.  Respiratory:  Positive for cough. Negative for hemoptysis and shortness of breath.   Cardiovascular:  Negative for chest pain, leg swelling and palpitations.  Gastrointestinal:  Negative for abdominal pain, blood in stool, constipation, diarrhea, nausea and vomiting.  Genitourinary:  Negative for hematuria.   Skin: Negative.   Neurological:  Positive for numbness. Negative for dizziness, headaches and light-headedness.  Hematological:  Does not bruise/bleed easily.    PHYSICAL EXAM:   Performance status (ECOG): 1 - Symptomatic but completely ambulatory  There were no vitals filed for this visit. Wt Readings from Last  3 Encounters:  02/05/24 179 lb 12.8 oz (81.6 kg)  01/11/24 177 lb (80.3 kg)  09/02/23 177 lb (80.3 kg)   Physical Exam Constitutional:      Appearance: Normal appearance. She is normal weight.  Cardiovascular:     Heart sounds: Normal heart sounds.  Pulmonary:     Breath sounds: Normal breath sounds.  Chest:       Comments: BREAST EXAM DONE 01/11/2024 (1) RIGHT breast with what feels to be soft tissue or fluid-filled cyst -- soft and freely mobile, nontender, rounded margins, approximately 2 cm in diameter, 2 cm lateral to right nipple in the 9 o'clock position.  Right breast upper outer quadrant lumpectomy scar is within normal limits.   No left breast abnormalities.   No palpable adenopathy in bilateral axillae.   Neurological:     General: No focal deficit present.     Mental Status: Mental status is at baseline.  Psychiatric:        Behavior: Behavior normal. Behavior is cooperative.      PAST MEDICAL/SURGICAL HISTORY:  Past Medical History:  Diagnosis Date   Arthritis    Cellulitis    Right leg   Hyperlipidemia    Hypertension    Lobular carcinoma of right breast (HCC) 09/27/2014   Malignant neoplasm of upper-outer quadrant of right female breast (HCC) 09/27/2014   Paroxysmal atrial fibrillation (HCC)    Presence of permanent cardiac pacemaker    Pulmonary hypertension (HCC)    Thoracic ascending aortic aneurysm    Varicose veins    Past Surgical History:  Procedure Laterality Date   ABDOMINAL HYSTERECTOMY     CHOLECYSTECTOMY     COLONOSCOPY WITH PROPOFOL  N/A 08/05/2022   Procedure: COLONOSCOPY WITH PROPOFOL ;  Surgeon: Eartha Angelia Sieving, MD;  [facility];  Service: Gastroenterology;  Laterality: N/A;  10:30am, asa 3   ESOPHAGOGASTRODUODENOSCOPY N/A 01/13/2018   Procedure: ESOPHAGOGASTRODUODENOSCOPY (EGD);  Surgeon: Golda Claudis PENNER, MD;  [facility];  Service: Endoscopy;  Laterality: N/A;   ESOPHAGOGASTRODUODENOSCOPY (EGD) WITH  PROPOFOL  N/A 08/05/2022   Procedure: ESOPHAGOGASTRODUODENOSCOPY (EGD) WITH PROPOFOL ;  Surgeon: Eartha Angelia Sieving, MD;  [facility];  Service: Gastroenterology;  Laterality: N/A;   FOOT SURGERY     Multiple foot surgeries by Dr. Merlynn BOYS CAPSULE STUDY N/A 08/26/2017   Procedure: GIVENS CAPSULE STUDY;  Surgeon: Golda Claudis PENNER, MD;  [facility]: [facility];  Service: Endoscopy;  Laterality: N/A;  8:30   HOT HEMOSTASIS  08/05/2022   Procedure: HOT HEMOSTASIS (ARGON PLASMA COAGULATION/BICAP);  Surgeon: Eartha Angelia, Sieving, MD;  Location: [facility];  Service: Gastroenterology;;   JOINT REPLACEMENT     bilateral knee by Drs. McGee and McGinley   PACEMAKER IMPLANT Left 05/12/2013   MDT Kelli PPM implanted by Dr Hiram for  CHB and SSS   POLYPECTOMY  08/05/2022   Procedure: POLYPECTOMY INTESTINAL;  Surgeon: Eartha Angelia Sieving, MD;  [facility];  Service: Gastroenterology;;   [facility] GENERATOR CHANGEOUT N/A 10/07/2022   Procedure: PPM GENERATOR CHANGEOUT;  Surgeon: Nancey Eulas BRAVO, MD;  Location: [facility];  Service: Cardiovascular;  Laterality: N/A;   SHOULDER SURGERY      SOCIAL HISTORY:  Social History   Socioeconomic History   Marital status: Divorced    Spouse name: Not on file   Number of children: Not on file   Years of education: Not on file   Highest education level: Not on file  Occupational History   Not on file  Tobacco Use   Smoking status: Never    Passive exposure: Past   Smokeless tobacco: Never  Vaping Use   Vaping status: Never Used  Substance and Sexual Activity   Alcohol use: No   Drug use: No   Sexual activity: Not on file  Other Topics Concern   Not on file  Social History Narrative   Lives in [place]   Divorced   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   Social Drivers of Health   Tobacco Use: Low Risk (02/05/2024)   Patient History    Smoking Tobacco Use: Never    Smokeless Tobacco Use: Never     Passive Exposure: Past  Financial Resource Strain: Low Risk (09/26/2022)   Received [facility]   Overall Financial Resource Strain (CARDIA)    Difficulty of Paying Living Expenses: Not hard at all  Food Insecurity: No Food Insecurity (09/26/2022)   Received from [facility]   Epic    Within the past 12 months, you worried that your food would run out before you got the money to buy more.: Never true    Within the past 12 months, the food you bought just didn't last and you didn't have money to get more.: Never true  Transportation Needs: No Transportation Needs (09/26/2022)   Received from [facility]   PRAPARE - Transportation    Lack of Transportation (Medical): No    Lack of Transportation (Non-Medical): No  Physical Activity: Not on file  Stress: Not on file  Social Connections: Not on file  Intimate Partner Violence: Not on file  Depression (PHQ2-9): Low Risk (01/11/2024)   Depression (PHQ2-9)    PHQ-2 Score: 0  Alcohol Screen: Not on file  Housing: Not on file  Utilities: Low Risk (09/26/2022)   Received from [facility]   Utilities    Within the past 12 months, have you been unable to get utilities(heat, electricity) when it was really needed?: No  Health Literacy: Not on file    FAMILY HISTORY:  Family History  Problem Relation Age of Onset   Hypertension Other    Diabetes Mother     CURRENT MEDICATIONS:  Current Outpatient Medications  Medication Sig Dispense Refill   acetaminophen  (TYLENOL ) 325 MG tablet Take 650 mg by mouth every 6 (six) hours as needed for moderate pain.     acetaZOLAMIDE (DIAMOX) 250 MG tablet Take 250 mg by mouth daily. (Patient not taking: Reported on 02/05/2024)     albuterol  (VENTOLIN  HFA) 108 (90 Base) MCG/ACT inhaler Inhale 1-2 puffs into the lungs every 6 (six) hours as needed for shortness of breath or wheezing.     anastrozole  (ARIMIDEX ) 1 MG tablet Take 1 tablet (1 mg total) by mouth daily. 30 tablet 5  apixaban   (ELIQUIS ) 5 MG TABS tablet Take 1 tablet (5 mg total) by mouth 2 (two) times daily. 60 tablet 6   carvedilol (COREG) 12.5 MG tablet Take 12.5 mg by mouth 2 (two) times daily with a meal.     citalopram  (CELEXA ) 20 MG tablet Take 20 mg by mouth every morning.      Colchicine 0.6 MG CAPS Take 0.6 mg by mouth daily. (Patient not taking: Reported on 02/05/2024)     dapagliflozin  propanediol (FARXIGA ) 10 MG TABS tablet Take 1 tablet (10 mg total) by mouth daily. 30 tablet 4   diazepam  (VALIUM ) 2 MG tablet Take 2 mg by mouth 2 (two) times daily.     famotidine (PEPCID) 20 MG tablet Take 20 mg by mouth daily.     febuxostat  (ULORIC ) 40 MG tablet Take 40 mg by mouth every other day.     FEROSUL 325 (65 Fe) MG tablet TAKE 1 TABLET BY MOUTH DAILY WITH BREAKFAST. 90 tablet 1   furosemide  (LASIX ) 20 MG tablet Take 20 mg by mouth daily.     gabapentin (NEURONTIN) 100 MG capsule Take 100 mg by mouth at bedtime.     gemfibrozil  (LOPID ) 600 MG tablet Take 600 mg by mouth 2 (two) times daily before a meal.     hydrALAZINE  (APRESOLINE ) 50 MG tablet Take 50 mg by mouth 3 (three) times daily.     ipratropium-albuterol  (DUONEB) 0.5-2.5 (3) MG/3ML SOLN Take 3 mLs by nebulization every 6 (six) hours as needed (wheezing). (Patient not taking: Reported on 02/05/2024)     KRILL OIL PO Take 2,000 mg by mouth daily. (Patient not taking: Reported on 02/05/2024)     labetalol (NORMODYNE) 200 MG tablet Take 200 mg by mouth 2 (two) times daily.     olmesartan  (BENICAR ) 40 MG tablet TAKE ONE TABLET BY MOUTH DAILY 30 tablet 11   pantoprazole  (PROTONIX ) 40 MG tablet Take 40 mg by mouth 2 (two) times daily.     rosuvastatin (CRESTOR) 10 MG tablet Take 10 mg by mouth daily.     traMADol (ULTRAM) 50 MG tablet Take 50 mg by mouth 3 (three) times daily as needed.     TURMERIC PO Take 1 capsule by mouth daily.     No current facility-administered medications for this visit.   Facility-Administered Medications Ordered in Other Visits   Medication Dose Route Frequency Provider Last Rate Last Admin   0.9 %  sodium chloride  infusion   Intravenous Continuous Katragadda, Sreedhar, MD 20 mL/hr at 05/07/18 1447 New Bag at 05/07/18 1447    ALLERGIES:  Allergies  Allergen Reactions   Dihydrocodeine Other (See Comments)    No reaction noted. funny feeling   Allopurinol    Cefuroxime    Fosamax [Alendronate]    Hydrocodone    Other     Blisters after wear compression stockings    LABORATORY DATA:  I have reviewed the labs as listed.     Latest Ref Rng & Units 04/26/2024    2:07 PM 01/05/2024    2:50 PM 12/16/2022   10:42 AM  CBC  WBC 4.0 - 10.5 K/uL 5.7  6.8    Hemoglobin 12.0 - 15.0 g/dL 9.2  89.9  89.5   Hematocrit 36.0 - 46.0 % 30.5  33.1  32.8   Platelets 150 - 400 K/uL 242  259        Latest Ref Rng & Units 04/26/2024    2:07 PM 01/05/2024    2:50 PM  03/20/2023    2:07 PM  CMP  Glucose 70 - 99 mg/dL 896  885  887   BUN 8 - 23 mg/dL 32  23  21   Creatinine 0.44 - 1.00 mg/dL 8.46  8.59  8.97   Sodium 135 - 145 mmol/L 142  139  142   Potassium 3.5 - 5.1 mmol/L 3.9  3.7  4.6   Chloride 98 - 111 mmol/L 105  102  102   CO2 22 - 32 mmol/L 22  23  24    Calcium 8.9 - 10.3 mg/dL 8.6  9.5  9.6   Total Protein 6.5 - 8.1 g/dL 6.7  7.3    Total Bilirubin 0.0 - 1.2 mg/dL 0.2  0.5    Alkaline Phos 38 - 126 U/L 107  112    AST 15 - 41 U/L 19  17    ALT 0 - 44 U/L 8  10      DIAGNOSTIC IMAGING:  I have independently reviewed the scans and discussed with the patient. CUP PACEART REMOTE DEVICE CHECK Result Date: 04/07/2024 Pacemaker: Scheduled remote reviewed. Normal device function.  Presenting rhythm: 3 AHR, Known PAF, Eliquis  per EPIC EMR, AF burden 12.3%.  AF ongoing since 12/02/205, good V- rate control Next remote transmission per protocol. ML, CVRS    WRAP UP:  All questions were answered. The patient knows to call the clinic with any problems, questions or concerns.  Medical decision making:  Moderate  Time spent on visit: I spent 20 minutes counseling the patient face to face. The total time spent in the appointment was 30 minutes and more than 50% was on counseling.  Pleasant CHRISTELLA Barefoot, PA-C  05/03/2024 2:48 PM  "

## 2024-05-03 ENCOUNTER — Inpatient Hospital Stay

## 2024-05-03 ENCOUNTER — Inpatient Hospital Stay: Admitting: Physician Assistant

## 2024-05-03 VITALS — BP 155/63 | HR 62 | Temp 97.5°F | Resp 17 | Ht 63.0 in | Wt 180.0 lb

## 2024-05-03 DIAGNOSIS — D631 Anemia in chronic kidney disease: Secondary | ICD-10-CM

## 2024-05-03 DIAGNOSIS — D508 Other iron deficiency anemias: Secondary | ICD-10-CM

## 2024-05-03 DIAGNOSIS — D500 Iron deficiency anemia secondary to blood loss (chronic): Secondary | ICD-10-CM | POA: Diagnosis not present

## 2024-05-03 DIAGNOSIS — Z170 Estrogen receptor positive status [ER+]: Secondary | ICD-10-CM

## 2024-05-03 DIAGNOSIS — M8589 Other specified disorders of bone density and structure, multiple sites: Secondary | ICD-10-CM | POA: Diagnosis not present

## 2024-05-03 DIAGNOSIS — N1832 Chronic kidney disease, stage 3b: Secondary | ICD-10-CM

## 2024-05-03 DIAGNOSIS — C50411 Malignant neoplasm of upper-outer quadrant of right female breast: Secondary | ICD-10-CM

## 2024-05-03 DIAGNOSIS — E559 Vitamin D deficiency, unspecified: Secondary | ICD-10-CM

## 2024-05-03 DIAGNOSIS — C50911 Malignant neoplasm of unspecified site of right female breast: Secondary | ICD-10-CM

## 2024-05-03 MED ORDER — VITAMIN D 25 MCG (1000 UNIT) PO TABS: 2000.0000 [IU] | ORAL_TABLET | Freq: Every day | ORAL | 3 refills | Status: AC

## 2024-05-03 MED ORDER — FERROUS SULFATE 325 (65 FE) MG PO TABS: 325.0000 mg | ORAL_TABLET | Freq: Every day | ORAL | 1 refills | Status: AC

## 2024-05-03 NOTE — Patient Instructions (Signed)
 [facility] at [facility] **VISIT SUMMARY & IMPORTANT INSTRUCTIONS **   You were seen today by Pleasant Barefoot PA-C for your follow-up visit.    HISTORY OF BREAST CANCER You did not have any obvious signs of recurrent breast cancer based on your most recent labs and physical exam. Continue taking anastrozole  (Arimidex ) breast cancer pill daily. Your next mammogram and breast exam will be due in September 2026.  ANEMIA CAUSE TREATMENT  Iron deficiency Caused by intermittent blood loss from your stomach We will schedule you for IV iron x 2 Follow-up with gastroenterology (NP Mitzie Boettcher) Continue iron tablet daily.  Chronic kidney disease  Chronic kidney disease makes it more difficult for your body to make new blood cells. No treatment is needed as [place] as your hemoglobin is higher than 10.  If your hemoglobin drops below 10, we would consider starting you on injections (Retacrit) in the future.     OSTEOPENIA Your bone density scan showed weak and fragile bones (called osteopenia).  This is related to age and being postmenopausal, but can also be a side effect of your breast cancer pill. Osteopenia increases your risk of breaking a bone. Please INCREASE your vitamin D  to take 2000 units daily.  Prescription has been sent to your pharmacy. Since you have declined treatment of osteopenia, we will check another bone density scan in September 2027 for ongoing surveillance.  FOLLOW-UP APPOINTMENT: 3 months  ** Thank you for trusting me with your healthcare!  I strive to provide all of my patients with quality care at each visit.  If you receive a survey for this visit, I would be so grateful to you for taking the time to provide feedback.  Thank you in advance!  ~ Mckennah Kretchmer                                        Dr. Mickiel Davonna Pleasant Barefoot, PA-C          Delon Hope, NP   - - - - - - - - - - - - - - - - - -    Thank you for choosing [facility] at [facility] to provide your oncology and hematology care.  To afford each patient quality time with our provider, please arrive at least 15 minutes before your scheduled appointment time.   If you have a lab appointment with the Cancer Center please come in thru the Main Entrance and check in at the main information desk.  You need to re-schedule your appointment should you arrive 10 or more minutes late.  We strive to give you quality time with our providers, and arriving late affects you and other patients whose appointments are after yours.  Also, if you no show three or more times for appointments you may be dismissed from the clinic at the providers discretion.     Again, thank you for choosing [facility].  Our hope is that these requests will decrease the amount of time that you wait before being seen by our physicians.       _____________________________________________________________  Should you have questions after your visit to [facility], please contact our office at [phone] and follow the prompts.  Our office hours are 8:00 a.m. and 4:30 p.m. Monday - Friday.  Please note that voicemails left after 4:00 p.m. may not be returned until the following business day.  We are closed weekends and major holidays.  You do have access to a nurse 24-7, just call the main number to the clinic [phone] and do not press any options, hold on the line and a nurse will answer the phone.    For prescription refill requests, have your pharmacy contact our office and allow 72 hours.

## 2024-05-03 NOTE — Progress Notes (Signed)
 Patient decline Prolia  injection today.

## 2024-05-05 ENCOUNTER — Encounter: Payer: Self-pay | Admitting: Physician Assistant

## 2024-05-06 ENCOUNTER — Encounter: Payer: Self-pay | Admitting: Physician Assistant

## 2024-05-12 ENCOUNTER — Inpatient Hospital Stay: Attending: Physician Assistant

## 2024-05-12 VITALS — BP 162/60 | HR 59 | Temp 97.8°F | Resp 19

## 2024-05-12 DIAGNOSIS — D500 Iron deficiency anemia secondary to blood loss (chronic): Secondary | ICD-10-CM | POA: Insufficient documentation

## 2024-05-12 DIAGNOSIS — N1832 Chronic kidney disease, stage 3b: Secondary | ICD-10-CM | POA: Insufficient documentation

## 2024-05-12 DIAGNOSIS — Z853 Personal history of malignant neoplasm of breast: Secondary | ICD-10-CM | POA: Diagnosis not present

## 2024-05-12 DIAGNOSIS — D508 Other iron deficiency anemias: Secondary | ICD-10-CM

## 2024-05-12 DIAGNOSIS — D631 Anemia in chronic kidney disease: Secondary | ICD-10-CM | POA: Diagnosis not present

## 2024-05-12 MED ORDER — CETIRIZINE HCL 10 MG/ML IV SOLN
5.0000 mg | Freq: Once | INTRAVENOUS | Status: AC
  Administered 2024-05-12: 5 mg via INTRAVENOUS
  Filled 2024-05-12: qty 1

## 2024-05-12 MED ORDER — SODIUM CHLORIDE 0.9 % IV SOLN: INTRAVENOUS | Status: DC

## 2024-05-12 MED ORDER — ACETAMINOPHEN 325 MG PO TABS
650.0000 mg | ORAL_TABLET | Freq: Once | ORAL | Status: AC
  Administered 2024-05-12: 650 mg via ORAL
  Filled 2024-05-12: qty 2

## 2024-05-12 MED ORDER — SODIUM CHLORIDE 0.9 % IV SOLN
510.0000 mg | Freq: Once | INTRAVENOUS | Status: AC
  Administered 2024-05-12: 510 mg via INTRAVENOUS
  Filled 2024-05-12: qty 510

## 2024-05-12 NOTE — Progress Notes (Signed)
 Patient tolerated iron infusion with no complaints voiced.  Peripheral IV site clean and dry with good blood return noted before and after infusion. Pt observed for 30 minutes post iron without any complications.  VSS with discharge and left in satisfactory condition with no s/s of distress noted. All follow ups as scheduled.   [place] Adcock

## 2024-05-12 NOTE — Patient Instructions (Signed)

## 2024-05-17 ENCOUNTER — Encounter (INDEPENDENT_AMBULATORY_CARE_PROVIDER_SITE_OTHER): Payer: Self-pay | Admitting: *Deleted

## 2024-05-19 ENCOUNTER — Inpatient Hospital Stay

## 2024-05-25 ENCOUNTER — Other Ambulatory Visit: Payer: Self-pay | Admitting: *Deleted

## 2024-05-25 ENCOUNTER — Ambulatory Visit: Attending: Cardiology | Admitting: Cardiology

## 2024-05-25 ENCOUNTER — Encounter: Payer: Self-pay | Admitting: Cardiology

## 2024-05-25 VITALS — BP 148/72 | HR 87 | Ht 63.0 in | Wt 178.2 lb

## 2024-05-25 DIAGNOSIS — Z950 Presence of cardiac pacemaker: Secondary | ICD-10-CM | POA: Diagnosis not present

## 2024-05-25 DIAGNOSIS — I10 Essential (primary) hypertension: Secondary | ICD-10-CM | POA: Diagnosis not present

## 2024-05-25 DIAGNOSIS — I5032 Chronic diastolic (congestive) heart failure: Secondary | ICD-10-CM | POA: Diagnosis not present

## 2024-05-25 DIAGNOSIS — I77810 Thoracic aortic ectasia: Secondary | ICD-10-CM

## 2024-05-25 DIAGNOSIS — I480 Paroxysmal atrial fibrillation: Secondary | ICD-10-CM | POA: Diagnosis not present

## 2024-05-25 NOTE — Progress Notes (Signed)
"  ° ° °  Cardiology Office Note  Date: 05/25/2024   ID: Sharlett, Lienemann 1940/10/05, MRN [id number]  History of Present Illness: JANARIA MCCAMMON is an 84 y.o. female last seen in April 2025.  She is here for a follow-up visit.  States that she has been doing well, no palpitations or chest discomfort, no obvious fluid retention.  She continues to follow with Dr. Orpha for primary care.  Medtronic pacemaker in place with history of complete heart block, followed by Dr. Nancey.  Device interrogation in December 2025 showed normal function with AF burden approximately 12%.  We went over her medications today.  No reported spontaneous bleeding problems on Eliquis .  She did tolerate the addition of fark [employer] made at last visit.  Antihypertensive medications are being adjusted by Dr. Orpha.  I reviewed her most recent lab work.  Physical Exam: VS:  BP (!) 148/72 (BP Location: Left Arm)   Pulse 87   Ht 5' 3 (1.6 m)   Wt 178 lb 3.2 oz (80.8 kg)   SpO2 95%   BMI 31.57 kg/m , BMI Body mass index is 31.57 kg/m.  Wt Readings from Last 3 Encounters:  05/25/24 178 lb 3.2 oz (80.8 kg)  05/03/24 180 lb (81.6 kg)  02/05/24 179 lb 12.8 oz (81.6 kg)    General: Patient appears comfortable at rest. HEENT: Conjunctiva and lids normal. Neck: Supple, no elevated JVP or carotid bruits. Lungs: Clear to auscultation, nonlabored breathing at rest. Cardiac: Regular rate and rhythm, no S3, 1/6 systolic murmur. Extremities: No pitting edema.  ECG:  An ECG dated 09/02/2023 was personally reviewed today and demonstrated:  Ventricular pacing.  Labwork: January 2025: Cholesterol 137, triglycerides 135, HDL 43, LDL 70 04/26/2024: ALT 8; AST 19; BUN 32; Creatinine, Ser 1.53; Hemoglobin 9.2; Platelets 242; Potassium 3.9; Sodium 142   Other Studies Reviewed Today:  No interval cardiac testing for review today.  Assessment and Plan:  1.  HFpEF, LVEF 60 to 65% by echocardiogram in December 2024.  RV  function normal with moderately elevated RVSP of 56 mmHg.  Clinically stable with NYHA class II dyspnea and no fluid retention.  Plan to continue Lasix  20 mg daily and Farxiga  10 mg daily.   2.  Asymptomatic 4.1 cm ascending thoracic aortic dilatation, stable by chest CT April 2025.  Plan to follow-up chest CTA in April of this year.   3.  Primary hypertension.  Continue to follow-up with PCP.  Current regimen includes Benicar  40 mg daily, hydralazine  50 mg 3 times daily, and Coreg 12.5 mg twice daily.   4.  Paroxysmal atrial fibrillation with CHA2DS2-VASc score of 5.  She is asymptomatic in terms of palpitations.  Device interrogation revealed approximate 12% rhythm burden most recently.  Continue Eliquis  2.5 twice daily for stroke prophylaxis along with beta-blocker.  I reviewed her recent lab work.   5.  History of complete heart block with Medtronic pacemaker in place and follow-up by Dr. Nancey.   6.  Small noncalcified pulmonary nodules, stable by chest CT April 2025.  Disposition:  Follow up 6 months.  Signed, Jayson JUDITHANN Sierras, M.D., F.A.C.C. [facility]

## 2024-05-25 NOTE — Patient Instructions (Addendum)
 Medication Instructions:  Your physician recommends that you continue on your current medications as directed. Please refer to the Current Medication list given to you today.  Labwork: BMET (Due in April before your CT) Non-fasting [facility] ([address]  or [facility])  Testing/Procedures: CT Angio Chest & Aorta in April 2026  Follow-Up: Your physician recommends that you schedule a follow-up appointment in: 6 months  Any Other Special Instructions Will Be Listed Below (If Applicable).  If you need a refill on your cardiac medications before your next appointment, please call your pharmacy.

## 2024-05-26 ENCOUNTER — Inpatient Hospital Stay

## 2024-05-26 VITALS — BP 148/62 | HR 59 | Temp 97.3°F | Resp 16

## 2024-05-26 DIAGNOSIS — N1832 Chronic kidney disease, stage 3b: Secondary | ICD-10-CM | POA: Diagnosis not present

## 2024-05-26 DIAGNOSIS — D508 Other iron deficiency anemias: Secondary | ICD-10-CM

## 2024-05-26 MED ORDER — SODIUM CHLORIDE 0.9 % IV SOLN
510.0000 mg | Freq: Once | INTRAVENOUS | Status: AC
  Administered 2024-05-26: 510 mg via INTRAVENOUS
  Filled 2024-05-26: qty 510

## 2024-05-26 MED ORDER — ACETAMINOPHEN 325 MG PO TABS
650.0000 mg | ORAL_TABLET | Freq: Once | ORAL | Status: AC
  Administered 2024-05-26: 650 mg via ORAL
  Filled 2024-05-26: qty 2

## 2024-05-26 MED ORDER — CETIRIZINE HCL 10 MG/ML IV SOLN
5.0000 mg | Freq: Once | INTRAVENOUS | Status: AC
  Administered 2024-05-26: 5 mg via INTRAVENOUS
  Filled 2024-05-26: qty 1

## 2024-05-26 MED ORDER — SODIUM CHLORIDE 0.9 % IV SOLN: INTRAVENOUS | Status: DC

## 2024-05-26 NOTE — Progress Notes (Signed)
 Patient presents today for Feraheme  infusion. Vital signs stable. Patient denies any side effects related to her last iron.   Feraheme  given today per MD orders. Tolerated infusion without adverse affects. Vital signs stable. No complaints at this time. Discharged from clinic ambulatory in stable condition. Alert and oriented x 3. F/U with [facility] as scheduled.

## 2024-05-26 NOTE — Patient Instructions (Signed)
 [facility] [place] - A DEPT OF [facility]  Discharge Instructions: Thank you for choosing [facility] to provide your oncology and hematology care.  If you have a lab appointment with the Cancer Center - please note that after April 8th, 2024, all labs will be drawn in the cancer center.  You do not have to check in or register with the main entrance as you have in the past but will complete your check-in in the cancer center.  Wear comfortable clothing and clothing appropriate for easy access to any Portacath or PICC line.   We strive to give you quality time with your provider. You may need to reschedule your appointment if you arrive late (15 or more minutes).  Arriving late affects you and other patients whose appointments are after yours.  Also, if you miss three or more appointments without notifying the office, you may be dismissed from the clinic at the provider's discretion.      For prescription refill requests, have your pharmacy contact our office and allow 72 hours for refills to be completed.    Today you received the following chemotherapy and/or immunotherapy agents Feraheme. Ferumoxytol Injection What is this medication? FERUMOXYTOL (FER ue MOX i tol) treats low levels of iron in your body (iron deficiency anemia). Iron is a mineral that plays an important role in making red blood cells, which carry oxygen from your lungs to the rest of your body. This medicine may be used for other purposes; ask your health care provider or pharmacist if you have questions. COMMON BRAND NAME(S): Feraheme What should I tell my care team before I take this medication? They need to know if you have any of these conditions: Anemia not caused by low iron levels High levels of iron in the blood Magnetic resonance imaging (MRI) test scheduled An unusual or allergic reaction to iron, other medications, foods, dyes, or preservatives Pregnant or trying to get  pregnant Breastfeeding How should I use this medication? This medication is injected into a vein. It is given by your care team in a hospital or clinic setting. Talk to your care team the use of this medication in children. Special care may be needed. Overdosage: If you think you have taken too much of this medicine contact a poison control center or emergency room at once. NOTE: This medicine is only for you. Do not share this medicine with others. What if I miss a dose? It is important not to miss your dose. Call your care team if you are unable to keep an appointment. What may interact with this medication? Other iron products This list may not describe all possible interactions. Give your health care provider a list of all the medicines, herbs, non-prescription drugs, or dietary supplements you use. Also tell them if you smoke, drink alcohol, or use illegal drugs. Some items may interact with your medicine. What should I watch for while using this medication? Visit your care team for regular checks on your progress. Tell your care team if your symptoms do not start to get better or if they get worse. You may need blood work done while you are taking this medication. You may need to eat more foods that contain iron. Talk to your care team. Foods that contain iron include whole grains or cereals, dried fruits, beans, peas, leafy green vegetables, and organ meats (liver, kidney). What side effects may I notice from receiving this medication? Side effects that  you should report to your care team as soon as possible: Allergic reactions--skin rash, itching, hives, swelling of the face, lips, tongue, or throat Low blood pressure--dizziness, feeling faint or lightheaded, blurry vision Shortness of breath Side effects that usually do not require medical attention (report to your care team if they continue or are bothersome): Flushing Headache Joint pain Muscle pain Nausea Pain, redness, or  irritation at injection site This list may not describe all possible side effects. Call your doctor for medical advice about side effects. You may report side effects to FDA at 1-800-FDA-1088. Where should I keep my medication? This medication is given in a hospital or clinic. It will not be stored at home. NOTE: This sheet is a summary. It may not cover all possible information. If you have questions about this medicine, talk to your doctor, pharmacist, or health care provider.  2024 Elsevier/Gold Standard (2022-12-10 00:00:00)      To help prevent nausea and vomiting after your treatment, we encourage you to take your nausea medication as directed.  BELOW ARE SYMPTOMS THAT SHOULD BE REPORTED IMMEDIATELY: *FEVER GREATER THAN 100.4 F (38 C) OR HIGHER *CHILLS OR SWEATING *NAUSEA AND VOMITING THAT IS NOT CONTROLLED WITH YOUR NAUSEA MEDICATION *UNUSUAL SHORTNESS OF BREATH *UNUSUAL BRUISING OR BLEEDING *URINARY PROBLEMS (pain or burning when urinating, or frequent urination) *BOWEL PROBLEMS (unusual diarrhea, constipation, pain near the anus) TENDERNESS IN MOUTH AND THROAT WITH OR WITHOUT PRESENCE OF ULCERS (sore throat, sores in mouth, or a toothache) UNUSUAL RASH, SWELLING OR PAIN  UNUSUAL VAGINAL DISCHARGE OR ITCHING   Items with * indicate a potential emergency and should be followed up as soon as possible or go to the Emergency Department if any problems should occur.  Please show the CHEMOTHERAPY ALERT CARD or IMMUNOTHERAPY ALERT CARD at check-in to the Emergency Department and triage nurse.  Should you have questions after your visit or need to cancel or reschedule your appointment, please contact [facility] [place] - A DEPT OF [facility] [phone]  and follow the prompts.  Office hours are 8:00 a.m. to 4:30 p.m. Monday - Friday. Please note that voicemails left after 4:00 p.m. may not be returned until the following business day.  We are closed weekends  and major holidays. You have access to a nurse at all times for urgent questions. Please call the main number to the clinic [phone] and follow the prompts.  For any non-urgent questions, you may also contact your provider using MyChart. We now offer e-Visits for anyone 67 and older to request care online for non-urgent symptoms. For details visit [email or web address].   Also download the MyChart app! Go to the app store, search "MyChart", open the app, select [place], and log in with your MyChart username and password.

## 2024-08-01 ENCOUNTER — Inpatient Hospital Stay

## 2024-08-08 ENCOUNTER — Inpatient Hospital Stay: Admitting: Physician Assistant

## 2024-08-17 ENCOUNTER — Other Ambulatory Visit (HOSPITAL_COMMUNITY)
